# Patient Record
Sex: Female | Born: 1955 | Race: Black or African American | Hispanic: No | Marital: Single | Smoking: Current every day smoker
Health system: Southern US, Community
[De-identification: ages and names within clinical notes are randomized; demographics above are authoritative.]

## PROBLEM LIST (undated history)

## (undated) DIAGNOSIS — F329 Major depressive disorder, single episode, unspecified: Secondary | ICD-10-CM

## (undated) DIAGNOSIS — K219 Gastro-esophageal reflux disease without esophagitis: Secondary | ICD-10-CM

## (undated) HISTORY — DX: Major depressive disorder, single episode, unspecified: F32.9

## (undated) HISTORY — PX: FRACTURE SURGERY: SHX138

## (undated) HISTORY — PX: TUBAL LIGATION: SHX77

## (undated) HISTORY — PX: WRIST FRACTURE SURGERY: SHX121

## (undated) HISTORY — PX: TONSILLECTOMY: SUR1361

## (undated) HISTORY — DX: Gastro-esophageal reflux disease without esophagitis: K21.9

---

## 2004-09-05 DIAGNOSIS — F32A Depression, unspecified: Secondary | ICD-10-CM

## 2004-09-05 DIAGNOSIS — K219 Gastro-esophageal reflux disease without esophagitis: Secondary | ICD-10-CM

## 2004-09-05 HISTORY — DX: Gastro-esophageal reflux disease without esophagitis: K21.9

## 2004-09-05 HISTORY — DX: Depression, unspecified: F32.A

## 2011-04-06 LAB — HM PAP SMEAR: HM Pap smear: NORMAL

## 2014-02-18 ENCOUNTER — Ambulatory Visit (INDEPENDENT_AMBULATORY_CARE_PROVIDER_SITE_OTHER): Payer: BC Managed Care – PPO | Admitting: Adult Health

## 2014-02-18 ENCOUNTER — Encounter: Payer: Self-pay | Admitting: Adult Health

## 2014-02-18 VITALS — BP 113/79 | HR 92 | Temp 98.0°F | Resp 14 | Ht 66.5 in | Wt 202.5 lb

## 2014-02-18 DIAGNOSIS — F32A Depression, unspecified: Secondary | ICD-10-CM

## 2014-02-18 DIAGNOSIS — Z1211 Encounter for screening for malignant neoplasm of colon: Secondary | ICD-10-CM | POA: Insufficient documentation

## 2014-02-18 DIAGNOSIS — Z1239 Encounter for other screening for malignant neoplasm of breast: Secondary | ICD-10-CM

## 2014-02-18 DIAGNOSIS — F3289 Other specified depressive episodes: Secondary | ICD-10-CM

## 2014-02-18 DIAGNOSIS — F329 Major depressive disorder, single episode, unspecified: Secondary | ICD-10-CM

## 2014-02-18 MED ORDER — SERTRALINE HCL 25 MG PO TABS: 25.0000 mg | ORAL_TABLET | Freq: Every day | ORAL | Status: DC

## 2014-02-18 MED ORDER — BUSPIRONE HCL 5 MG PO TABS: 5.0000 mg | ORAL_TABLET | Freq: Two times a day (BID) | ORAL | Status: DC

## 2014-02-18 MED ORDER — BUSPIRONE HCL 5 MG PO TABS: 5.0000 mg | ORAL_TABLET | Freq: Three times a day (TID) | ORAL | Status: DC

## 2014-02-18 NOTE — Patient Instructions (Signed)
  Start zoloft 25 mg daily  Also start Buspar 5 mg twice a day. You can start with once a day and see how you feel then add the second dose.  I would like to see you back in 1 month for follow up.  I am sending you to [facility] for your colonoscopy. They will call you with an appointment.  I am also ordering a mammogram. Hoyle Sauer will help you set that up today before you leave.  You will need to schedule your complete physical exam at your convenience.

## 2014-02-18 NOTE — Progress Notes (Signed)
Pre visit review using our clinic review tool, if applicable. No additional management support is needed unless otherwise documented below in the visit note. 

## 2014-02-18 NOTE — Progress Notes (Signed)
Subjective:    Patient ID: Deborah Jenkins, female    DOB: 04/07/1956, 58 y.o.   MRN: [id number]  HPI  Pleasant 58 yo AA female with a history of depression and GERD presents today to establish care. She was previously being seen at [facility] in [place], [place]. She has her records, but left them at home. She has faced major depression since 2006 since she lost a husband to cancer. Since that time she has lost another husband. She recently moved to [facility] in January to be closer to family. Previously taken Zoloft, with some success and is interested in potentially starting a low dose. Has tried wellbutrin and celexa in the past but did not work. She also started smoking again as a coping mechanism.   Prevention: Eye exam - 12/2013, PAP - 2013, Mammogram -2012, Never had a colonoscopy, Hepatitis B vaccine in progress    Past Medical History  Diagnosis Date  . Depression 2006  . GERD (gastroesophageal reflux disease) 2006     Past Surgical History  Procedure Laterality Date  . Tonsillectomy Bilateral Childhood     Family History  Problem Relation Age of Onset  . Hypertension Mother   . Hypertension Sister   . Alcohol abuse Brother   . Alcohol abuse Brother   . Cirrhosis Brother   . Alcohol abuse Brother   . Cirrhosis Brother      History   Social History  . Marital Status: Single    Spouse Name: N/A    Number of Children: 2  . Years of Education: 14   Occupational History  . Medical Assistant    Social History Main Topics  . Smoking status: Current Every Day Smoker -- 1.00 packs/day for 10 years    Types: Cigarettes  . Smokeless tobacco: Never Used  . Alcohol Use: No  . Drug Use: No  . Sexual Activity: Not on file   Other Topics Concern  . Not on file   Social History Narrative  . No narrative on file     Review of Systems Constitutional: Negative.  HENT: Negative.  Eyes: Negative.  Respiratory: Negative.  Cardiovascular: Negative.    Gastrointestinal: Negative.  Endocrine: Negative.  Genitourinary: Negative.  Musculoskeletal: Negative Skin: Negative. Allergic/Immunologic: Negative.  Neurological: Negative.  Hematological: Negative.  Psychiatric/Behavioral: Negative.     Objective:  BP 113/79  Pulse 92  Temp(Src) 98 F (36.7 C) (Oral)  Resp 14  Ht 5' 6.5" (1.689 m)  Wt 202 lb 8 oz (91.853 kg)  BMI 32.20 kg/m2  SpO2 96%   Physical Exam Constitutional: She is oriented to person, place, and time. No distress.  HENT:  Head: Normocephalic and atraumatic.  Eyes: Conjunctivae and EOM are normal.  Neck: Normal range of motion. Neck supple.  Cardiovascular: Normal rate and regular rhythm.  Pulmonary/Chest: Effort normal. No respiratory distress.  Musculoskeletal: Normal range of motion.  Neurological: She is alert and oriented to person, place, and time. She has normal reflexes. Coordination normal.  Skin: Skin is warm and dry.  Psychiatric: She has a normal mood and affect. Her behavior is normal. Judgment and thought content normal.     Assessment & Plan:   1. Colon cancer screening Refer for screening colonoscopy. She has not had this done. - Ambulatory referral to Gastroenterology  2. Breast cancer screening Needs mammogram. Will have this scheduled. - MM DIGITAL SCREENING BILATERAL; Future  3. Depression Start Zoloft 25 mg daily. Will also add buspar 5 mg  bid. RTC in 1 month for follow up.

## 2014-02-19 ENCOUNTER — Other Ambulatory Visit: Payer: Self-pay | Admitting: *Deleted

## 2014-02-19 DIAGNOSIS — F32A Depression, unspecified: Secondary | ICD-10-CM | POA: Insufficient documentation

## 2014-02-19 DIAGNOSIS — F329 Major depressive disorder, single episode, unspecified: Secondary | ICD-10-CM | POA: Insufficient documentation

## 2014-02-19 MED ORDER — BUSPIRONE HCL 5 MG PO TABS: 5.0000 mg | ORAL_TABLET | Freq: Two times a day (BID) | ORAL | Status: DC

## 2014-03-20 ENCOUNTER — Ambulatory Visit: Payer: BC Managed Care – PPO | Admitting: Adult Health

## 2014-03-20 ENCOUNTER — Ambulatory Visit: Payer: Self-pay | Admitting: Adult Health

## 2014-03-20 LAB — HM MAMMOGRAPHY: HM Mammogram: NEGATIVE

## 2014-05-06 ENCOUNTER — Ambulatory Visit: Payer: BC Managed Care – PPO | Admitting: Adult Health

## 2014-05-16 ENCOUNTER — Ambulatory Visit: Payer: Self-pay | Admitting: Gastroenterology

## 2014-07-22 ENCOUNTER — Encounter: Payer: Self-pay | Admitting: Adult Health

## 2015-06-04 ENCOUNTER — Other Ambulatory Visit: Payer: Self-pay | Admitting: Physician Assistant

## 2015-06-04 DIAGNOSIS — K2100 Gastro-esophageal reflux disease with esophagitis, without bleeding: Secondary | ICD-10-CM

## 2015-06-04 MED ORDER — OMEPRAZOLE 20 MG PO CPDR: 20.0000 mg | DELAYED_RELEASE_CAPSULE | Freq: Every day | ORAL | Status: DC

## 2015-07-21 ENCOUNTER — Other Ambulatory Visit (HOSPITAL_COMMUNITY)
Admission: RE | Admit: 2015-07-21 | Discharge: 2015-07-21 | Disposition: A | Discharge status: Home / Self Care | Payer: 59 | Source: Ambulatory Visit | Attending: Nurse Practitioner | Admitting: Nurse Practitioner

## 2015-07-21 ENCOUNTER — Ambulatory Visit (INDEPENDENT_AMBULATORY_CARE_PROVIDER_SITE_OTHER): Payer: 59 | Admitting: Nurse Practitioner

## 2015-07-21 ENCOUNTER — Encounter: Payer: Self-pay | Admitting: Nurse Practitioner

## 2015-07-21 VITALS — BP 120/84 | HR 65 | Temp 98.5°F | Resp 16 | Ht 67.0 in | Wt 200.6 lb

## 2015-07-21 DIAGNOSIS — Z1151 Encounter for screening for human papillomavirus (HPV): Secondary | ICD-10-CM | POA: Diagnosis not present

## 2015-07-21 DIAGNOSIS — Z0000 Encounter for general adult medical examination without abnormal findings: Secondary | ICD-10-CM

## 2015-07-21 DIAGNOSIS — F32A Depression, unspecified: Secondary | ICD-10-CM

## 2015-07-21 DIAGNOSIS — F329 Major depressive disorder, single episode, unspecified: Secondary | ICD-10-CM

## 2015-07-21 DIAGNOSIS — Z01419 Encounter for gynecological examination (general) (routine) without abnormal findings: Secondary | ICD-10-CM | POA: Diagnosis not present

## 2015-07-21 MED ORDER — ZOLPIDEM TARTRATE 5 MG PO TABS: 5.0000 mg | ORAL_TABLET | Freq: Every evening | ORAL | Status: DC | PRN

## 2015-07-21 NOTE — Patient Instructions (Signed)

## 2015-07-21 NOTE — Progress Notes (Signed)
Pre visit review using our clinic review tool, if applicable. No additional management support is needed unless otherwise documented below in the visit note. 

## 2015-07-21 NOTE — Progress Notes (Signed)
Patient ID: Deborah Jenkins, female    DOB: 25-Jul-1956  Age: 59 y.o. MRN: [id number]  CC: Annual Exam   HPI Deborah Jenkins presents for annual exam.   1) Health Maintenance-   Diet- No formal   Exercise- No formal   Immunizations- UTD  Mammogram- 7/15, wants to do 2 years   Pap- Today   Colonoscopy- 9/15, normal, every 5 years  Eye Exam- Not UTD  Dental Exam- Not UTD   LMP- 59 yo last cycle   2) Acute Problems-  Melatonin- 10 mg, not keeping her asleep, staying asleep 3-4 hours at a time   PHQ-9= 13 (Patient does not like the holidays due to deaths in her family of recent).   Deborah Jenkins has a past medical Deborah of Depression (2006) and GERD (gastroesophageal reflux disease) (2006).   Deborah Jenkins, Childhood).   Her family Deborah includes Alcohol abuse in her brother, brother, and brother; Cirrhosis in her brother and brother; Hypertension in her mother and sister.Deborah reports that Deborah has been smoking Cigarettes.  Deborah has a 10 pack-year smoking Deborah. Deborah has never used smokeless tobacco. Deborah reports that Deborah does not drink alcohol or use illicit drugs.  Outpatient Prescriptions Prior to Visit  Medication Sig Dispense Refill  . aspirin 81 MG tablet Take 81 mg by mouth daily.    . cholecalciferol (VITAMIN D) 1000 UNITS tablet Take 1,000 Units by mouth daily.    . Multiple Vitamins-Minerals (MULTI ADULT GUMMIES) CHEW Chew 1 tablet by mouth daily.    Marland Kitchen omeprazole (PRILOSEC) 20 MG capsule Take 1 capsule (20 mg total) by mouth daily. 90 capsule 4  . busPIRone (BUSPAR) 5 MG tablet Take 1 tablet (5 mg total) by mouth 2 (two) times daily. (Patient not taking: Reported on 07/21/2015) 60 tablet 5  . esomeprazole (NEXIUM) 40 MG capsule Take 40 mg by mouth daily at 12 noon.    . sertraline (ZOLOFT) 25 MG tablet Take 1 tablet (25 mg total) by mouth daily. (Patient not taking: Reported on 07/21/2015) 30 tablet 6   No  facility-administered medications prior to visit.    ROS Review of Systems  Constitutional: Negative for fever, chills, diaphoresis, fatigue and unexpected weight change.  HENT: Negative for tinnitus and trouble swallowing.   Eyes: Negative for visual disturbance.  Respiratory: Negative for chest tightness, shortness of breath and wheezing.   Cardiovascular: Negative for chest pain, palpitations and leg swelling.  Gastrointestinal: Negative for nausea, vomiting, abdominal pain, diarrhea, constipation and blood in stool.  Endocrine: Negative for polydipsia, polyphagia and polyuria.  Genitourinary: Negative for dysuria, hematuria, vaginal discharge and vaginal pain.  Musculoskeletal: Negative for myalgias, back pain, arthralgias and gait problem.  Skin: Negative for color change and rash.  Neurological: Negative for dizziness, weakness, numbness and headaches.  Hematological: Does not bruise/bleed easily.  Psychiatric/Behavioral: Positive for sleep disturbance. Negative for suicidal ideas. The patient is not nervous/anxious.     Objective:  BP 120/84 mmHg  Pulse 65  Temp(Src) 98.5 F (36.9 C)  Resp 16  Ht 5\' 7"  (1.702 m)  Wt 200 lb 9.6 oz (90.992 kg)  BMI 31.41 kg/m2  SpO2 98%  Physical Exam  Constitutional: Deborah is oriented to person, place, and time. Deborah appears well-developed and well-nourished. No distress.  HENT:  Head: Normocephalic and atraumatic.  Right Ear: External ear normal.  Left Ear: External ear normal.  Nose: Nose normal.  Mouth/Throat: Oropharynx is clear and moist. No oropharyngeal  exudate.  TMs and canals clear bilaterally  Eyes: Conjunctivae and EOM are normal. Pupils are equal, round, and reactive to light. Right eye exhibits no discharge. Left eye exhibits no discharge. No scleral icterus.  Neck: Normal range of motion. Neck supple. No thyromegaly present.  Cardiovascular: Normal rate, regular rhythm, normal heart sounds and intact distal pulses.  Exam  reveals no gallop and no friction rub.   No murmur heard. Pulmonary/Chest: Effort normal and breath sounds normal. No respiratory distress. Deborah has no wheezes. Deborah has no rales. Deborah exhibits no tenderness.  Clinical breast exam performed without significant findings  Abdominal: Soft. Bowel sounds are normal. Deborah exhibits no distension and no mass. There is no tenderness. There is no rebound and no guarding.  Genitourinary: Vagina normal and uterus normal. No vaginal discharge found.  Mild vaginal dryness   Musculoskeletal: Normal range of motion. Deborah exhibits no edema or tenderness.  Lymphadenopathy:    Deborah has no cervical adenopathy.  Neurological: Deborah is alert and oriented to person, place, and time. Deborah has normal reflexes. No cranial nerve deficit. Deborah exhibits normal muscle tone. Coordination normal.  Skin: Skin is warm and dry. No rash noted. Deborah is not diaphoretic. No erythema. No pallor.  Psychiatric: Deborah has a normal mood and affect. Her behavior is normal. Judgment and thought content normal.  Tearful when discussed loses of family   Assessment & Plan:   Bitha was seen today for annual exam.  Diagnoses and all orders for this visit:  Routine general medical examination at a health care facility -     CBC with Differential/Platelet; Future -     Comprehensive metabolic panel; Future -     Hemoglobin A1c; Future -     Lipid panel; Future -     TSH; Future -     VITAMIN D 25 Hydroxy (Vit-D Deficiency, Fractures); Future -     Cytology - PAP  Depression  Encounter for routine gynecological examination  Other orders -     zolpidem (AMBIEN) 5 MG tablet; Take 1 tablet (5 mg total) by mouth at bedtime as needed for sleep.   I have discontinued Ms. Hulett's esomeprazole, sertraline, and busPIRone. I am also having her start on zolpidem. Additionally, I am having her maintain her aspirin, MULTI ADULT GUMMIES, cholecalciferol, omeprazole, FLUoxetine, and MELATONIN PO.  Meds  ordered this encounter  Medications  . FLUoxetine (PROZAC) 20 MG capsule    Sig: Take 1 capsule by mouth daily.    Refill:  6  . MELATONIN PO    Sig: Take 1 tablet by mouth daily.  Marland Kitchen zolpidem (AMBIEN) 5 MG tablet    Sig: Take 1 tablet (5 mg total) by mouth at bedtime as needed for sleep.    Dispense:  15 tablet    Refill:  1    Order Specific Question:  Supervising Provider    Answer:  Crecencio Mc [[id number]]     Follow-up: Return in about 1 week (around 07/28/2015) for LAB visit for fasting labs.

## 2015-07-24 LAB — CYTOLOGY - PAP

## 2015-07-29 ENCOUNTER — Other Ambulatory Visit: Payer: 59

## 2015-08-02 DIAGNOSIS — Z01419 Encounter for gynecological examination (general) (routine) without abnormal findings: Secondary | ICD-10-CM | POA: Insufficient documentation

## 2015-08-02 DIAGNOSIS — Z0000 Encounter for general adult medical examination without abnormal findings: Secondary | ICD-10-CM | POA: Insufficient documentation

## 2015-08-02 NOTE — Assessment & Plan Note (Addendum)
Patient is experiencing grief around the holidays. PHQ-9 was positive for probable depressive disorder. We discussed options and she would like to continue on Prozac. I asked if she would like to see a counselor and she entertained the idea. She reported she would let me know. She feels that this is just part of grieving and she is okay at this point in time.  Sleep disturbance part of her depression. Will trial Ambien.

## 2015-08-02 NOTE — Assessment & Plan Note (Signed)
Discussed acute and chronic issues. Reviewed health maintenance measures, PFSHx, and immunizations. Obtain routine labs TSH, Lipid panel, CBC w/ diff, A1c, Vitamin D, and CMET.   PAP obtained today Clinical breast exam done today Pt would like to have Mammograms every 2 years (neg family hx)

## 2015-08-02 NOTE — Assessment & Plan Note (Signed)
PAP obtained today. Clinical breast exam done today without findings. Defer mammogram for 1 more year

## 2015-08-04 ENCOUNTER — Other Ambulatory Visit: Payer: Self-pay | Admitting: Physician Assistant

## 2015-08-21 ENCOUNTER — Ambulatory Visit: Payer: Self-pay | Admitting: Family

## 2015-08-21 ENCOUNTER — Encounter: Payer: Self-pay | Admitting: Physician Assistant

## 2015-08-21 VITALS — BP 135/95 | HR 100 | Temp 97.5°F | Resp 16

## 2015-08-21 DIAGNOSIS — J019 Acute sinusitis, unspecified: Secondary | ICD-10-CM

## 2015-08-21 DIAGNOSIS — J309 Allergic rhinitis, unspecified: Secondary | ICD-10-CM

## 2015-08-21 MED ORDER — AZITHROMYCIN 250 MG PO TABS: ORAL_TABLET | ORAL | Status: DC

## 2015-08-21 MED ORDER — HYDROCOD POLST-CPM POLST ER 10-8 MG/5ML PO SUER: 5.0000 mL | Freq: Two times a day (BID) | ORAL | Status: AC | PRN

## 2015-08-21 NOTE — Progress Notes (Signed)
S/ Chronic allergies , acute sx x 4 - 5 days, facial pain and pressure , ear pain and pressure, fatigue, chilled, no fever  O/ VSS mildly ill , NAD ENT tms dull ,retracted, Nasal turbinates allergic changes with rhinorhea Maxillary and frontal tenderness, pharynx clear neck supple nontender , heart rsr lungs clear A / sinusitis P Zpack 250 mg as directed. neti pot/ saline products. tussionex 1 tsp q12 hours prn #100 cc o rf

## 2015-11-19 DIAGNOSIS — H53002 Unspecified amblyopia, left eye: Secondary | ICD-10-CM | POA: Diagnosis not present

## 2015-11-19 DIAGNOSIS — H53022 Refractive amblyopia, left eye: Secondary | ICD-10-CM | POA: Diagnosis not present

## 2015-11-19 DIAGNOSIS — H52221 Regular astigmatism, right eye: Secondary | ICD-10-CM | POA: Diagnosis not present

## 2015-11-19 DIAGNOSIS — H524 Presbyopia: Secondary | ICD-10-CM | POA: Diagnosis not present

## 2015-11-19 DIAGNOSIS — H5203 Hypermetropia, bilateral: Secondary | ICD-10-CM | POA: Diagnosis not present

## 2015-12-21 ENCOUNTER — Encounter: Payer: Self-pay | Admitting: Physician Assistant

## 2015-12-21 ENCOUNTER — Ambulatory Visit: Payer: Self-pay | Admitting: Physician Assistant

## 2015-12-21 VITALS — BP 118/86 | HR 88 | Temp 97.6°F

## 2015-12-21 MED ORDER — AZITHROMYCIN 250 MG PO TABS: ORAL_TABLET | ORAL | Status: DC

## 2015-12-21 NOTE — Progress Notes (Signed)
S: C/o runny nose and congestion with congested cough for 7 days, no fever, chills, cp/sob, v/d; mucus was green this am, cough is sporadic,   Using otc meds: alka seltzer  O: PE: vitals wnl, nad, perrl eomi, normocephalic, tms dull, nasal mucosa red and swollen, throat injected, neck supple no lymph, lungs c t a, cv rrr, neuro intact  A:  Acute uri   P: zpack, drink fluids, continue regular meds , use otc meds of choice, return if not improving in 5 days, return earlier if worsening

## 2015-12-29 ENCOUNTER — Other Ambulatory Visit: Payer: Self-pay | Admitting: Physician Assistant

## 2015-12-29 NOTE — Telephone Encounter (Signed)
Med refill approved, pt sees her pcp regularly, is doing well on medication and dosage

## 2016-04-15 ENCOUNTER — Ambulatory Visit: Payer: Self-pay | Admitting: Physician Assistant

## 2016-04-15 ENCOUNTER — Encounter: Payer: Self-pay | Admitting: Physician Assistant

## 2016-04-15 VITALS — BP 119/78 | HR 88 | Temp 98.6°F

## 2016-04-15 DIAGNOSIS — M62830 Muscle spasm of back: Secondary | ICD-10-CM | POA: Insufficient documentation

## 2016-04-15 MED ORDER — CYCLOBENZAPRINE HCL 10 MG PO TABS: 10.0000 mg | ORAL_TABLET | Freq: Three times a day (TID) | ORAL | 0 refills | Status: DC | PRN

## 2016-04-15 NOTE — Progress Notes (Signed)
S/ pain to  R  Upper back noted upon awakening , no injury  Or strain known, worse with deep breath, denies CP ,cough ,fever or acute sxs   O/ VSS alert NAD heart rsr , lungs clear,upper thoracic paraspinous muscle with pt tenderness mimicking sxs  A/ Muscle spasm thoracic P topical ice /heat. rx flexeril 10 mg I q 8 h prn #12 o rf.f/u prn not improving.

## 2016-05-18 ENCOUNTER — Ambulatory Visit: Payer: Self-pay | Admitting: Physician Assistant

## 2016-05-18 ENCOUNTER — Encounter: Payer: Self-pay | Admitting: Physician Assistant

## 2016-05-18 DIAGNOSIS — N390 Urinary tract infection, site not specified: Secondary | ICD-10-CM

## 2016-05-18 DIAGNOSIS — R319 Hematuria, unspecified: Principal | ICD-10-CM

## 2016-05-18 LAB — POCT URINALYSIS DIPSTICK
Bilirubin, UA: NEGATIVE
Glucose, UA: NEGATIVE
Ketones, UA: NEGATIVE
Nitrite, UA: NEGATIVE
Protein, UA: NEGATIVE
Spec Grav, UA: 1.02
Urobilinogen, UA: 0.2
pH, UA: 7

## 2016-05-18 MED ORDER — CIPROFLOXACIN HCL 250 MG PO TABS: 250.0000 mg | ORAL_TABLET | Freq: Two times a day (BID) | ORAL | 0 refills | Status: DC

## 2016-05-18 NOTE — Addendum Note (Signed)
Addended by: Versie Starks on: 05/18/2016 12:11 PM   Modules accepted: Orders

## 2016-05-18 NOTE — Progress Notes (Signed)
S: pt not feeling well, chills, body aches, no v/d, no cough and cold sx  O: urine with trace leuks and 1+ blood  A: uti  P cipro 250mg  bid x7d

## 2016-06-20 ENCOUNTER — Other Ambulatory Visit: Payer: Self-pay | Admitting: Physician Assistant

## 2016-06-20 DIAGNOSIS — K2100 Gastro-esophageal reflux disease with esophagitis, without bleeding: Secondary | ICD-10-CM

## 2016-07-26 ENCOUNTER — Other Ambulatory Visit: Payer: Self-pay | Admitting: Physician Assistant

## 2016-08-24 ENCOUNTER — Other Ambulatory Visit: Payer: Self-pay | Admitting: Physician Assistant

## 2016-08-24 MED ORDER — FLUOXETINE HCL 20 MG PO CAPS: 20.0000 mg | ORAL_CAPSULE | Freq: Every day | ORAL | 4 refills | Status: DC

## 2016-08-24 NOTE — Progress Notes (Signed)
Med refill for prozac, 90d supply

## 2016-09-06 ENCOUNTER — Other Ambulatory Visit: Payer: Self-pay | Admitting: Physician Assistant

## 2016-09-06 NOTE — Telephone Encounter (Signed)
Med refill for flonase approved 

## 2016-10-03 ENCOUNTER — Telehealth: Payer: Self-pay | Admitting: Family

## 2016-10-03 MED ORDER — IBUPROFEN 800 MG PO TABS: 800.0000 mg | ORAL_TABLET | Freq: Three times a day (TID) | ORAL | 0 refills | Status: DC | PRN

## 2016-10-03 NOTE — Telephone Encounter (Signed)
Dental pain, dental appt tomorrow, hurts to eat , no fever  .

## 2017-01-16 ENCOUNTER — Ambulatory Visit: Payer: Self-pay | Admitting: Physician Assistant

## 2017-01-16 ENCOUNTER — Encounter: Payer: Self-pay | Admitting: Physician Assistant

## 2017-01-16 VITALS — BP 100/78 | HR 68 | Temp 98.2°F | Resp 99

## 2017-01-16 DIAGNOSIS — J0100 Acute maxillary sinusitis, unspecified: Secondary | ICD-10-CM

## 2017-01-16 MED ORDER — MOMETASONE FUROATE 50 MCG/ACT NA SUSP: 2.0000 | Freq: Every day | NASAL | 12 refills | Status: DC

## 2017-01-16 MED ORDER — AZITHROMYCIN 250 MG PO TABS: ORAL_TABLET | ORAL | 0 refills | Status: DC

## 2017-01-16 MED ORDER — PREDNISONE 10 MG PO TABS: 30.0000 mg | ORAL_TABLET | Freq: Every day | ORAL | 0 refills | Status: DC

## 2017-01-16 NOTE — Progress Notes (Signed)
S: C/o sinus pain and headache , no fever, chills, cp/sob, v/d; cough is sporadic but has a smokers cough, c/o of facial and dental pain.   Using otc meds: flonase  O: PE: vitals wnl, nad, perrl eomi, normocephalic, tms dull, nasal mucosa red and swollen, throat injected, neck supple no lymph, lungs c t a, cv rrr, neuro intact  A:  Acute sinusitis   P: drink fluids, continue regular meds , use otc meds of choice, return if not improving in 5 days, return earlier if worsening , zpack, nasonex, pred 30 mg qd x 3d

## 2017-02-15 ENCOUNTER — Ambulatory Visit: Payer: Self-pay | Admitting: Physician Assistant

## 2017-02-15 ENCOUNTER — Encounter: Payer: Self-pay | Admitting: Physician Assistant

## 2017-02-15 DIAGNOSIS — F411 Generalized anxiety disorder: Secondary | ICD-10-CM

## 2017-02-15 DIAGNOSIS — Z566 Other physical and mental strain related to work: Secondary | ICD-10-CM

## 2017-02-15 MED ORDER — FLUOXETINE HCL 20 MG PO TABS: ORAL_TABLET | ORAL | 3 refills | Status: DC

## 2017-02-15 NOTE — Progress Notes (Signed)
S: pt having a lot of anxiety from pressures at work, states she feels full and like she is just going to break, doesn't like how the supervisors are treating her coworkers, feels that they are all being attacked, it is affecting her mental health, not sleeping well, is getting angry with the ones that are 'stirring trouble", no si/hi  O: nad, neuro intact, pt appears stressed, n/v intact  A: anxiety, work related stress  P: Prozac increase to 2 pills every other day, recommended EAP

## 2017-03-17 ENCOUNTER — Other Ambulatory Visit: Payer: Self-pay | Admitting: Physician Assistant

## 2017-05-16 ENCOUNTER — Ambulatory Visit: Payer: Self-pay | Admitting: Physician Assistant

## 2017-05-16 DIAGNOSIS — F419 Anxiety disorder, unspecified: Secondary | ICD-10-CM

## 2017-05-16 NOTE — Progress Notes (Signed)
S: Pt having a lot of anxiety due to "drama" in workplace, states she did double up on her prozac but it isn't helping  O: nad, neuro intact, pt appears stressed  A: anxiety  P: xanax 0.25mg  #10 nr

## 2017-06-07 ENCOUNTER — Ambulatory Visit: Payer: Self-pay | Admitting: Physician Assistant

## 2017-07-13 ENCOUNTER — Encounter: Payer: Self-pay | Admitting: Physician Assistant

## 2017-07-13 ENCOUNTER — Ambulatory Visit: Payer: Self-pay | Admitting: Physician Assistant

## 2017-07-13 VITALS — Temp 98.3°F

## 2017-07-13 DIAGNOSIS — J209 Acute bronchitis, unspecified: Secondary | ICD-10-CM

## 2017-07-13 MED ORDER — ALBUTEROL SULFATE HFA 108 (90 BASE) MCG/ACT IN AERS: 2.0000 | INHALATION_SPRAY | Freq: Four times a day (QID) | RESPIRATORY_TRACT | 0 refills | Status: DC | PRN

## 2017-07-13 MED ORDER — HYDROCOD POLST-CPM POLST ER 10-8 MG/5ML PO SUER: 5.0000 mL | Freq: Two times a day (BID) | ORAL | 0 refills | Status: DC | PRN

## 2017-07-13 MED ORDER — AZITHROMYCIN 250 MG PO TABS: ORAL_TABLET | ORAL | 0 refills | Status: DC

## 2017-07-13 NOTE — Progress Notes (Signed)
S: C/o cough and congestion with wheezing, chest is sore from coughing, denies fever, chills,o cough is wet and hacking; keeping pt awake at night;  denies cardiac type chest pain or sob, v/d, abd pain Remainder ros neg + smoker  O: vitals wnl, nad, tms clear, throat injected, neck supple no lymph, lungs with scattered wheezing, cv rrr, neuro intact  A:  Acute bronchitis   P:  rx medication:  Zpack, albuterol inhaler, tussionex 161ml nr; smoking cessation, use otc meds, tylenol or motrin as needed for fever/chills, return if not better in 3 -5 days, return earlier if worsening

## 2017-07-24 ENCOUNTER — Ambulatory Visit: Payer: Self-pay | Admitting: Physician Assistant

## 2017-07-24 DIAGNOSIS — K0889 Other specified disorders of teeth and supporting structures: Secondary | ICD-10-CM

## 2017-07-24 MED ORDER — AMOXICILLIN 875 MG PO TABS: 875.0000 mg | ORAL_TABLET | Freq: Two times a day (BID) | ORAL | 0 refills | Status: DC

## 2017-07-24 NOTE — Progress Notes (Signed)
S: Patient complains of a toothache in the left lower jaw, states she has a bad tooth that needs to be taken care of, denies chest pain, fever or chills  O: Appears well, left jaw is a little swollen  A: Dental pain  P: Amoxil 875 twice a day for 10 days

## 2017-08-04 ENCOUNTER — Other Ambulatory Visit: Payer: Self-pay | Admitting: Physician Assistant

## 2017-08-04 DIAGNOSIS — K2100 Gastro-esophageal reflux disease with esophagitis, without bleeding: Secondary | ICD-10-CM

## 2017-08-04 NOTE — Telephone Encounter (Signed)
Please see attached med refill request

## 2017-10-03 ENCOUNTER — Other Ambulatory Visit: Payer: Self-pay | Admitting: Family

## 2017-10-03 MED ORDER — FLUOXETINE HCL 20 MG PO TABS: ORAL_TABLET | ORAL | 3 refills | Status: DC

## 2017-10-03 MED ORDER — FLUTICASONE PROPIONATE 50 MCG/ACT NA SUSP: 2.0000 | Freq: Every day | NASAL | 6 refills | Status: DC

## 2017-10-03 NOTE — Progress Notes (Signed)
Need rx. Refills. Stable on medication.

## 2017-11-07 ENCOUNTER — Ambulatory Visit (INDEPENDENT_AMBULATORY_CARE_PROVIDER_SITE_OTHER): Payer: Self-pay | Admitting: Family Medicine

## 2017-11-07 ENCOUNTER — Encounter: Payer: Self-pay | Admitting: Family Medicine

## 2017-11-07 VITALS — BP 132/88 | HR 80 | Temp 98.1°F | Resp 16

## 2017-11-07 DIAGNOSIS — R059 Cough, unspecified: Secondary | ICD-10-CM

## 2017-11-07 DIAGNOSIS — J0110 Acute frontal sinusitis, unspecified: Secondary | ICD-10-CM

## 2017-11-07 DIAGNOSIS — R05 Cough: Secondary | ICD-10-CM

## 2017-11-07 MED ORDER — AMOXICILLIN-POT CLAVULANATE 875-125 MG PO TABS: 1.0000 | ORAL_TABLET | Freq: Two times a day (BID) | ORAL | 0 refills | Status: DC

## 2017-11-07 NOTE — Progress Notes (Signed)
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 She reports self treating with tessalon pearls, alka seltzer, and tylenol/motrin for symptoms with minimal relief. She has known sick contacts at work and is a smoker.  Review of Systems  Constitutional: Positive for fever and malaise/fatigue.  HENT: Positive for sinus pain and sore throat. Negative for ear discharge, ear pain and nosebleeds.   Eyes: Negative.   Respiratory: Positive for cough. Negative for sputum production and shortness of breath.   Cardiovascular: Negative.   Gastrointestinal: Negative.   Genitourinary: Negative for dysuria and urgency.  Musculoskeletal: Positive for myalgias.  Skin: Negative.   Neurological: Negative.   Psychiatric/Behavioral: Negative.     Physical Exam  Constitutional: She is oriented to person, place, and time. She appears well-developed and well-nourished. She is active.  HENT:  Head: Normocephalic.  Right Ear: Hearing, tympanic membrane, external ear and ear canal normal.  Left Ear: Hearing, tympanic membrane, external ear and ear canal normal.  Nose: Right sinus exhibits frontal sinus tenderness. Left sinus exhibits frontal sinus tenderness.  Marked facial tenderness on exam- patient described as sharp 6/10 on pain scale  Eyes: EOM are normal. Pupils are equal, round, and reactive to light.  Cardiovascular: Normal rate, regular rhythm and normal heart sounds.  No murmur heard. Pulmonary/Chest: Effort normal. No stridor. She has no decreased breath sounds. She has rales in the right upper field and the left upper field.  Abdominal: Soft.  Musculoskeletal: Normal range of motion.  Lymphadenopathy:    She has cervical adenopathy.  Neurological: She is alert and oriented to person, place, and time.    Vitals:   11/07/17 1300  BP: 132/88  Pulse: 80  Resp: 16  Temp: 98.1 F (36.7 C)   SpO2: 98%   A:  1. Acute non-recurrent frontal sinusitis   2. Cough    P: Discussed diagnosis, treatment and medication use and indications. Discussed the timeline of symptoms coupled with smoking status recommendation is to treat with abx and have patient f/u if symptoms unimproved. Patient verbalized understanding on information and agrees with plan of care.  1. Acute non-recurrent frontal sinusitis - amoxicillin-clavulanate (AUGMENTIN) 875-125 MG tablet; Take 1 tablet by mouth 2 (two) times daily.  2. Cough Continue treatment with tessalon pearls, water and soothing options for throat (salt-water rinses, and throat lozenges)

## 2017-11-07 NOTE — Patient Instructions (Signed)
s Sinusitis, Adult Sinusitis is soreness and inflammation of your sinuses. Sinuses are hollow spaces in the bones around your face. Your sinuses are located:  Around your eyes.  In the middle of your forehead.  Behind your nose.  In your cheekbones.  Your sinuses and nasal passages are lined with a stringy fluid (mucus). Mucus normally drains out of your sinuses. When your nasal tissues become inflamed or swollen, the mucus can become trapped or blocked so air cannot flow through your sinuses. This allows bacteria, viruses, and funguses to grow, which leads to infection. Sinusitis can develop quickly and last for 7?10 days (acute) or for more than 12 weeks (chronic). Sinusitis often develops after a cold. What are the causes? This condition is caused by anything that creates swelling in the sinuses or stops mucus from draining, including:  Allergies.  Asthma.  Bacterial or viral infection.  Abnormally shaped bones between the nasal passages.  Nasal growths that contain mucus (nasal polyps).  Narrow sinus openings.  Pollutants, such as chemicals or irritants in the air.  A foreign object stuck in the nose.  A fungal infection. This is rare.  What increases the risk? The following factors may make you more likely to develop this condition:  Having allergies or asthma.  Having had a recent cold or respiratory tract infection.  Having structural deformities or blockages in your nose or sinuses.  Having a weak immune system.  Doing a lot of swimming or diving.  Overusing nasal sprays.  Smoking.  What are the signs or symptoms? The main symptoms of this condition are pain and a feeling of pressure around the affected sinuses. Other symptoms include:  Upper toothache.  Earache.  Headache.  Bad breath.  Decreased sense of smell and taste.  A cough that may get worse at night.  Fatigue.  Fever.  Thick drainage from your nose. The drainage is often green  and it may contain pus (purulent).  Stuffy nose or congestion.  Postnasal drip. This is when extra mucus collects in the throat or back of the nose.  Swelling and warmth over the affected sinuses.  Sore throat.  Sensitivity to light.  How is this diagnosed? This condition is diagnosed based on symptoms, a medical history, and a physical exam. To find out if your condition is acute or chronic, your health care provider may:  Look in your nose for signs of nasal polyps.  Tap over the affected sinus to check for signs of infection.  View the inside of your sinuses using an imaging device that has a light attached (endoscope).  If your health care provider suspects that you have chronic sinusitis, you may also:  Be tested for allergies.  Have a sample of mucus taken from your nose (nasal culture) and checked for bacteria.  Have a mucus sample examined to see if your sinusitis is related to an allergy.  If your sinusitis does not respond to treatment and it lasts longer than 8 weeks, you may have an MRI or CT scan to check your sinuses. These scans also help to determine how severe your infection is. In rare cases, a bone biopsy may be done to rule out more serious types of fungal sinus disease. How is this treated? Treatment for sinusitis depends on the cause and whether your condition is chronic or acute. If a virus is causing your sinusitis, your symptoms will go away on their own within 10 days. You may be given medicines to relieve your  symptoms, including:  Topical nasal decongestants. They shrink swollen nasal passages and let mucus drain from your sinuses.  Antihistamines. These drugs block inflammation that is triggered by allergies. This can help to ease swelling in your nose and sinuses.  Topical nasal corticosteroids. These are nasal sprays that ease inflammation and swelling in your nose and sinuses.  Nasal saline washes. These rinses can help to get rid of thick mucus  in your nose.  If your condition is caused by bacteria, you will be given an antibiotic medicine. If your condition is caused by a fungus, you will be given an antifungal medicine. Surgery may be needed to correct underlying conditions, such as narrow nasal passages. Surgery may also be needed to remove polyps. Follow these instructions at home: Medicines  Take, use, or apply over-the-counter and prescription medicines only as told by your health care provider. These may include nasal sprays.  If you were prescribed an antibiotic medicine, take it as told by your health care provider. Do not stop taking the antibiotic even if you start to feel better. Hydrate and Humidify  Drink enough water to keep your urine clear or pale yellow. Staying hydrated will help to thin your mucus.  Use a cool mist humidifier to keep the humidity level in your home above 50%.  Inhale steam for 10-15 minutes, 3-4 times a day or as told by your health care provider. You can do this in the bathroom while a hot shower is running.  Limit your exposure to cool or dry air. Rest  Rest as much as possible.  Sleep with your head raised (elevated).  Make sure to get enough sleep each night. General instructions  Apply a warm, moist washcloth to your face 3-4 times a day or as told by your health care provider. This will help with discomfort.  Wash your hands often with soap and water to reduce your exposure to viruses and other germs. If soap and water are not available, use hand sanitizer.  Do not smoke. Avoid being around people who are smoking (secondhand smoke).  Keep all follow-up visits as told by your health care provider. This is important. Contact a health care provider if:  You have a fever.  Your symptoms get worse.  Your symptoms do not improve within 10 days. Get help right away if:  You have a severe headache.  You have persistent vomiting.  You have pain or swelling around your face or  eyes.  You have vision problems.  You develop confusion.  Your neck is stiff.  You have trouble breathing. This information is not intended to replace advice given to you by your health care provider. Make sure you discuss any questions you have with your health care provider. Document Released: 08/22/2005 Document Revised: 04/17/2016 Document Reviewed: 06/17/2015 Elsevier Interactive Patient Education  [employer].

## 2017-11-24 ENCOUNTER — Encounter: Payer: Self-pay | Admitting: Family Medicine

## 2017-11-24 ENCOUNTER — Ambulatory Visit (INDEPENDENT_AMBULATORY_CARE_PROVIDER_SITE_OTHER): Payer: Self-pay | Admitting: Family Medicine

## 2017-11-24 VITALS — BP 132/82 | HR 78 | Temp 98.1°F

## 2017-11-24 DIAGNOSIS — Z9114 Patient's other noncompliance with medication regimen: Secondary | ICD-10-CM

## 2017-11-24 DIAGNOSIS — J0110 Acute frontal sinusitis, unspecified: Secondary | ICD-10-CM

## 2017-11-24 DIAGNOSIS — K521 Toxic gastroenteritis and colitis: Secondary | ICD-10-CM

## 2017-11-24 MED ORDER — AZITHROMYCIN 500 MG PO TABS: 500.0000 mg | ORAL_TABLET | Freq: Every day | ORAL | 0 refills | Status: AC

## 2017-11-24 NOTE — Progress Notes (Signed)
Deborah Jenkins is a 62 y.o. female who presents today with persistant cough after treatment for sinusitus within last 2 weeks. Patient reported initially feeling better but then after experiencing diarrhea due to antibiotic did not complete course and now feels like symptoms are returning.  Review of Systems  Constitutional: Positive for malaise/fatigue.  HENT: Positive for congestion.   Eyes: Negative.   Respiratory: Positive for cough.   Cardiovascular: Negative.   Gastrointestinal: Negative.   Genitourinary: Negative.   Musculoskeletal: Negative.   Skin: Negative.   Neurological: Negative.   Endo/Heme/Allergies: Negative.   Psychiatric/Behavioral: Negative.     O: Vitals:   11/24/17 1437  BP: 132/82  Pulse: 78  Temp: 98.1 F (36.7 C)   Patient has a tight frequent cough on exam.  Physical Exam  Constitutional: She is oriented to person, place, and time. Vital signs are normal. She appears well-developed and well-nourished.  HENT:  Head: Normocephalic.  Neck: Normal range of motion. Neck supple.  Cardiovascular: Normal rate and regular rhythm.  Pulmonary/Chest: Effort normal. No stridor. She has no decreased breath sounds. She has no wheezes. She has no rhonchi. She has no rales.  Abdominal: Soft. Bowel sounds are normal.  Musculoskeletal: Normal range of motion.  Lymphadenopathy:    She has cervical adenopathy.       Right cervical: Superficial cervical adenopathy present.       Left cervical: Superficial cervical adenopathy present.  Neurological: She is alert and oriented to person, place, and time.  Skin: Skin is warm and dry.  Psychiatric: She has a normal mood and affect.  Vitals reviewed.   A: 1. Acute non-recurrent frontal sinusitis   2. Non compliance w medication regimen   3. Diarrhea due to drug    P: Discussed importance of continuing treatment until complete and timely communication of distressing side effects to healthcare provider so that alternative  treatment options may be explored. Patient verbalized understanding and agrees with POC at this time. No acute concerns on exam.  1. Acute non-recurrent frontal sinusitis - azithromycin (ZITHROMAX) 500 MG tablet; Take 1 tablet (500 mg total) by mouth daily for 3 days.  2. Non compliance w medication regimen  3. Diarrhea due to drug

## 2017-11-24 NOTE — Patient Instructions (Signed)
Antibiotic Medicine, Adult Antibiotic medicines treat infections caused by a type of germ called bacteria. They work by killing the bacteria that make you sick. When do I need to take antibiotics? You often need these medicines to treat bacterial infections, such as:  A urinary tract infection (UTI).  Strep throat.  Meningitis. This affects the spinal cord and brain.  A bad lung infection.  You may start the medicines while your doctor waits for tests to come back. When the tests come back, your doctor may change or stop your medicine. When are antibiotics not needed? You do not need these medicines for most common illnesses, such as:  A cold.  The flu.  A sore throat.  Antibiotics are not always needed for all infections caused by bacteria. Do not ask for these medicines, or take them, when they are not needed. What are the risks of taking antibiotics? Most antibiotics can cause an infection called Clostridium difficile.This causes watery poop (diarrhea). Let your doctor know right away if:  You have watery poop while taking an antibiotic.  You have watery poop after you stop taking an antibiotic. The illness can happen weeks after you stop the medicine.  You also have a risk of getting an infection in the future that antibiotics cannot treat (antibiotic-resistant infection). This type of infection can be dangerous. What else should I know about taking antibiotics?  You need to take the entire prescription. ? Take the medicine for as long as told by your doctor. ? Do not stop taking it even if you start to feel better.  Try not to miss any doses. If you miss a dose, call your doctor.  Birth control pills may not work. If you take birth control pills: ? Keep on taking them. ? Use a second form of birth control, such as a condom. Do this for as long as told by your doctor.  Ask your doctor: ? How long to wait in between doses. ? If you should take the medicine with  food. ? If there is anything you should stay away from while taking the antibiotic, such as: ? Food. ? Drinks. ? Medicines. ? If there are any side effects you should watch for.  Only take the medicines that your doctor told you to take. Do not take medicines that were given to someone else.  Drink a large glass of water with the medicine.  Ask the pharmacist for a tool to measure the medicine, such as: ? A syringe. ? A cup. ? A spoon.  Throw away any extra medicine. Contact a doctor if:  You get worse.  You have new joint pain or muscle aches after starting the medicine.  You have side effects from the medicine, such as: ? Stomach pain. ? Watery poop. ? Feeling sick to your stomach (nausea). Get help right away if:  You have signs of a very bad allergic reaction. If this happens, stop taking the medicine right away. Signs may include: ? Hives. These are raised, itchy, red bumps on the skin. ? Skin rash. ? Trouble breathing. ? Wheezing. ? Swelling. ? Feeling dizzy. ? Throwing up (vomiting).  Your pee (urine) is dark, or is the color of blood.  Your skin turns yellow.  You bruise easily.  You bleed easily.  You have very bad watery poop and cramps in your belly.  You have a very bad headache. Summary  Antibiotics are often used to treat infections caused by bacteria.  Only take these  medicines when needed.  Let your doctor know if you have watery poop while taking an antibiotic.  You need to take the entire prescription. This information is not intended to replace advice given to you by your health care provider. Make sure you discuss any questions you have with your health care provider. Document Released: 05/31/2008 Document Revised: 08/24/2016 Document Reviewed: 08/24/2016 Elsevier Interactive Patient Education  2017 [employer] Sinusitis, Adult Sinusitis is soreness and inflammation of your sinuses. Sinuses are hollow spaces in the bones around your  face. Your sinuses are located:  Around your eyes.  In the middle of your forehead.  Behind your nose.  In your cheekbones.  Your sinuses and nasal passages are lined with a stringy fluid (mucus). Mucus normally drains out of your sinuses. When your nasal tissues become inflamed or swollen, the mucus can become trapped or blocked so air cannot flow through your sinuses. This allows bacteria, viruses, and funguses to grow, which leads to infection. Sinusitis can develop quickly and last for 7?10 days (acute) or for more than 12 weeks (chronic). Sinusitis often develops after a cold. What are the causes? This condition is caused by anything that creates swelling in the sinuses or stops mucus from draining, including:  Allergies.  Asthma.  Bacterial or viral infection.  Abnormally shaped bones between the nasal passages.  Nasal growths that contain mucus (nasal polyps).  Narrow sinus openings.  Pollutants, such as chemicals or irritants in the air.  A foreign object stuck in the nose.  A fungal infection. This is rare.  What increases the risk? The following factors may make you more likely to develop this condition:  Having allergies or asthma.  Having had a recent cold or respiratory tract infection.  Having structural deformities or blockages in your nose or sinuses.  Having a weak immune system.  Doing a lot of swimming or diving.  Overusing nasal sprays.  Smoking.  What are the signs or symptoms? The main symptoms of this condition are pain and a feeling of pressure around the affected sinuses. Other symptoms include:  Upper toothache.  Earache.  Headache.  Bad breath.  Decreased sense of smell and taste.  A cough that may get worse at night.  Fatigue.  Fever.  Thick drainage from your nose. The drainage is often green and it may contain pus (purulent).  Stuffy nose or congestion.  Postnasal drip. This is when extra mucus collects in the  throat or back of the nose.  Swelling and warmth over the affected sinuses.  Sore throat.  Sensitivity to light.  How is this diagnosed? This condition is diagnosed based on symptoms, a medical history, and a physical exam. To find out if your condition is acute or chronic, your health care provider may:  Look in your nose for signs of nasal polyps.  Tap over the affected sinus to check for signs of infection.  View the inside of your sinuses using an imaging device that has a light attached (endoscope).  If your health care provider suspects that you have chronic sinusitis, you may also:  Be tested for allergies.  Have a sample of mucus taken from your nose (nasal culture) and checked for bacteria.  Have a mucus sample examined to see if your sinusitis is related to an allergy.  If your sinusitis does not respond to treatment and it lasts longer than 8 weeks, you may have an MRI or CT scan to check your sinuses. These scans also help  to determine how severe your infection is. In rare cases, a bone biopsy may be done to rule out more serious types of fungal sinus disease. How is this treated? Treatment for sinusitis depends on the cause and whether your condition is chronic or acute. If a virus is causing your sinusitis, your symptoms will go away on their own within 10 days. You may be given medicines to relieve your symptoms, including:  Topical nasal decongestants. They shrink swollen nasal passages and let mucus drain from your sinuses.  Antihistamines. These drugs block inflammation that is triggered by allergies. This can help to ease swelling in your nose and sinuses.  Topical nasal corticosteroids. These are nasal sprays that ease inflammation and swelling in your nose and sinuses.  Nasal saline washes. These rinses can help to get rid of thick mucus in your nose.  If your condition is caused by bacteria, you will be given an antibiotic medicine. If your condition is  caused by a fungus, you will be given an antifungal medicine. Surgery may be needed to correct underlying conditions, such as narrow nasal passages. Surgery may also be needed to remove polyps. Follow these instructions at home: Medicines  Take, use, or apply over-the-counter and prescription medicines only as told by your health care provider. These may include nasal sprays.  If you were prescribed an antibiotic medicine, take it as told by your health care provider. Do not stop taking the antibiotic even if you start to feel better. Hydrate and Humidify  Drink enough water to keep your urine clear or pale yellow. Staying hydrated will help to thin your mucus.  Use a cool mist humidifier to keep the humidity level in your home above 50%.  Inhale steam for 10-15 minutes, 3-4 times a day or as told by your health care provider. You can do this in the bathroom while a hot shower is running.  Limit your exposure to cool or dry air. Rest  Rest as much as possible.  Sleep with your head raised (elevated).  Make sure to get enough sleep each night. General instructions  Apply a warm, moist washcloth to your face 3-4 times a day or as told by your health care provider. This will help with discomfort.  Wash your hands often with soap and water to reduce your exposure to viruses and other germs. If soap and water are not available, use hand sanitizer.  Do not smoke. Avoid being around people who are smoking (secondhand smoke).  Keep all follow-up visits as told by your health care provider. This is important. Contact a health care provider if:  You have a fever.  Your symptoms get worse.  Your symptoms do not improve within 10 days. Get help right away if:  You have a severe headache.  You have persistent vomiting.  You have pain or swelling around your face or eyes.  You have vision problems.  You develop confusion.  Your neck is stiff.  You have trouble breathing. This  information is not intended to replace advice given to you by your health care provider. Make sure you discuss any questions you have with your health care provider. Document Released: 08/22/2005 Document Revised: 04/17/2016 Document Reviewed: 06/17/2015 Elsevier Interactive Patient Education  [employer].

## 2017-12-13 DIAGNOSIS — H524 Presbyopia: Secondary | ICD-10-CM | POA: Diagnosis not present

## 2017-12-13 DIAGNOSIS — H5203 Hypermetropia, bilateral: Secondary | ICD-10-CM | POA: Diagnosis not present

## 2017-12-13 DIAGNOSIS — H52221 Regular astigmatism, right eye: Secondary | ICD-10-CM | POA: Diagnosis not present

## 2018-01-10 ENCOUNTER — Encounter: Payer: Self-pay | Admitting: Family Medicine

## 2018-01-10 ENCOUNTER — Ambulatory Visit (INDEPENDENT_AMBULATORY_CARE_PROVIDER_SITE_OTHER): Payer: 59 | Admitting: Family Medicine

## 2018-01-10 VITALS — BP 110/88 | HR 79 | Temp 97.9°F | Resp 16 | Ht 67.0 in | Wt 204.0 lb

## 2018-01-10 DIAGNOSIS — Z124 Encounter for screening for malignant neoplasm of cervix: Secondary | ICD-10-CM

## 2018-01-10 DIAGNOSIS — F17200 Nicotine dependence, unspecified, uncomplicated: Secondary | ICD-10-CM

## 2018-01-10 DIAGNOSIS — K219 Gastro-esophageal reflux disease without esophagitis: Secondary | ICD-10-CM | POA: Diagnosis not present

## 2018-01-10 DIAGNOSIS — Z1239 Encounter for other screening for malignant neoplasm of breast: Secondary | ICD-10-CM

## 2018-01-10 DIAGNOSIS — Z0000 Encounter for general adult medical examination without abnormal findings: Secondary | ICD-10-CM

## 2018-01-10 DIAGNOSIS — Z6832 Body mass index (BMI) 32.0-32.9, adult: Secondary | ICD-10-CM | POA: Diagnosis not present

## 2018-01-10 DIAGNOSIS — Z1231 Encounter for screening mammogram for malignant neoplasm of breast: Secondary | ICD-10-CM | POA: Diagnosis not present

## 2018-01-10 DIAGNOSIS — F331 Major depressive disorder, recurrent, moderate: Secondary | ICD-10-CM | POA: Diagnosis not present

## 2018-01-10 DIAGNOSIS — E669 Obesity, unspecified: Secondary | ICD-10-CM

## 2018-01-10 DIAGNOSIS — E66811 Obesity, class 1: Secondary | ICD-10-CM

## 2018-01-10 NOTE — Assessment & Plan Note (Signed)
3 to 5-minute discussion on the health risks of continuing smoking, the benefits of cessation and options for cessation Discussed with patient that most people seem to have vivid dreams, but not nightmares, on Chantix She may consider this in the future We will continue to reassess at each visit whether she is ready to quit yet or not

## 2018-01-10 NOTE — Assessment & Plan Note (Signed)
Well-controlled Continue Prilosec

## 2018-01-10 NOTE — Patient Instructions (Signed)
Preventive Care 40-64 Years, Female Preventive care refers to lifestyle choices and visits with your health care provider that can promote health and wellness. What does preventive care include?  A yearly physical exam. This is also called an annual well check.  Dental exams once or twice a year.  Routine eye exams. Ask your health care provider how often you should have your eyes checked.  Personal lifestyle choices, including: ? Daily care of your teeth and gums. ? Regular physical activity. ? Eating a healthy diet. ? Avoiding tobacco and drug use. ? Limiting alcohol use. ? Practicing safe sex. ? Taking low-dose aspirin daily starting at age 58. ? Taking vitamin and mineral supplements as recommended by your health care provider. What happens during an annual well check? The services and screenings done by your health care provider during your annual well check will depend on your age, overall health, lifestyle risk factors, and family history of disease. Counseling Your health care provider may ask you questions about your:  Alcohol use.  Tobacco use.  Drug use.  Emotional well-being.  Home and relationship well-being.  Sexual activity.  Eating habits.  Work and work Statistician.  Method of birth control.  Menstrual cycle.  Pregnancy history.  Screening You may have the following tests or measurements:  Height, weight, and BMI.  Blood pressure.  Lipid and cholesterol levels. These may be checked every 5 years, or more frequently if you are over 81 years old.  Skin check.  Lung cancer screening. You may have this screening every year starting at age 78 if you have a 30-pack-year history of smoking and currently smoke or have quit within the past 15 years.  Fecal occult blood test (FOBT) of the stool. You may have this test every year starting at age 65.  Flexible sigmoidoscopy or colonoscopy. You may have a sigmoidoscopy every 5 years or a colonoscopy  every 10 years starting at age 30.  Hepatitis C blood test.  Hepatitis B blood test.  Sexually transmitted disease (STD) testing.  Diabetes screening. This is done by checking your blood sugar (glucose) after you have not eaten for a while (fasting). You may have this done every 1-3 years.  Mammogram. This may be done every 1-2 years. Talk to your health care provider about when you should start having regular mammograms. This may depend on whether you have a family history of breast cancer.  BRCA-related cancer screening. This may be done if you have a family history of breast, ovarian, tubal, or peritoneal cancers.  Pelvic exam and Pap test. This may be done every 3 years starting at age 80. Starting at age 36, this may be done every 5 years if you have a Pap test in combination with an HPV test.  Bone density scan. This is done to screen for osteoporosis. You may have this scan if you are at high risk for osteoporosis.  Discuss your test results, treatment options, and if necessary, the need for more tests with your health care provider. Vaccines Your health care provider may recommend certain vaccines, such as:  Influenza vaccine. This is recommended every year.  Tetanus, diphtheria, and acellular pertussis (Tdap, Td) vaccine. You may need a Td booster every 10 years.  Varicella vaccine. You may need this if you have not been vaccinated.  Zoster vaccine. You may need this after age 5.  Measles, mumps, and rubella (MMR) vaccine. You may need at least one dose of MMR if you were born in  1955 or later. You may also need a second dose.  Pneumococcal 13-valent conjugate (PCV13) vaccine. You may need this if you have certain conditions and were not previously vaccinated.  Pneumococcal polysaccharide (PPSV23) vaccine. You may need one or two doses if you smoke cigarettes or if you have certain conditions.  Meningococcal vaccine. You may need this if you have certain  conditions.  Hepatitis A vaccine. You may need this if you have certain conditions or if you travel or work in places where you may be exposed to hepatitis A.  Hepatitis B vaccine. You may need this if you have certain conditions or if you travel or work in places where you may be exposed to hepatitis B.  Haemophilus influenzae type b (Hib) vaccine. You may need this if you have certain conditions.  Talk to your health care provider about which screenings and vaccines you need and how often you need them. This information is not intended to replace advice given to you by your health care provider. Make sure you discuss any questions you have with your health care provider. Document Released: 09/18/2015 Document Revised: 05/11/2016 Document Reviewed: 06/23/2015 Elsevier Interactive Patient Education  2018 [employer]  

## 2018-01-10 NOTE — Assessment & Plan Note (Signed)
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

## 2018-01-10 NOTE — Progress Notes (Signed)
Patient: Deborah Jenkins, Female    DOB: [birth year], 62 y.o.   MRN: [id number] Visit Date: 01/10/2018  Today's Provider: Lavon Paganini, MD   I, Martha Clan, CMA, am acting as scribe for Lavon Paganini, MD.  Chief Complaint  Patient presents with  . Establish Care  . Annual Exam   Subjective:    Annual physical exam Deborah Jenkins is a 62 y.o. female who presents today for health maintenance and complete physical. She feels well. She reports exercising none, outside of yard work. She reports she is sleeping well.  Last pap- 07/21/2015- NIL; HPV negative. Pt was advised to repeat this in one year because the sample "was not the best" (see pt message). Last colonoscopy- 05/16/2014- diverticulosis. Lipoma in ascending colon. Family H/O polyps. Last mammogram- 03/20/2014- BI-RADS 1  Patient's history includes: GERD-well-controlled on omeprazole currently Depression- continues to have moderate symptoms, see PHQ 9.  She was advised previously to increase her Prozac to 40 mg daily, but she does not like the idea of taking medication for this.  She reports that she became depressed after she lost 2 husbands and her grandson to death.  She has not pursued counseling, but will consider this. Tobacco use disorder- patient has been smoking about 1/3 pack daily for 42 years.  She states she is not ready to quit at this time.  She has tried Wellbutrin, cold [place], patches/gum in the past.  She has been hesitant to try Chantix due to the risk of nightmares. -----------------------------------------------------------------   Review of Systems  Constitutional: Negative.   HENT: Negative.   Eyes: Negative.   Respiratory: Negative.   Cardiovascular: Negative.   Gastrointestinal: Negative.   Endocrine: Negative.   Genitourinary: Negative.   Musculoskeletal: Negative.   Skin: Negative.   Allergic/Immunologic: Negative.   Neurological: Negative.   Hematological: Negative.     Psychiatric/Behavioral: Negative.     Social History      She  reports that she has been smoking cigarettes.  She has a 12.60 pack-year smoking history. She has never used smokeless tobacco. She reports that she does not drink alcohol or use drugs.       Social History   Socioeconomic History  . Marital status: Single    Spouse name: Not on file  . Number of children: 2  . Years of education: 26  . Highest education level: Associate degree: academic program  Occupational History  . 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 Financial resource strain: Not on file  . Food insecurity:    Worry: Not on file    Inability: Not on file  . Transportation needs:    Medical: Not on file    Non-medical: Not on file  Tobacco Use  . Smoking status: Current Every Day Smoker    Packs/day: 0.30    Years: 42.00    Pack years: 12.60    Types: Cigarettes  . Smokeless tobacco: Never Used  . Tobacco comment: started smoking at 62 YO; is smoking 1/3 PPD  Substance and Sexual Activity  . Alcohol use: No    Alcohol/week: 0.0 oz  . Drug use: No  . Sexual activity: Yes    Birth control/protection: Post-menopausal  Lifestyle  . Physical activity:    Days per week: Not on file    Minutes per session: Not on file  . Stress: Not on file  Relationships  . Social connections:  Talks on phone: Not on file    Gets together: Not on file    Attends religious service: Not on file    Active member of club or organization: Not on file    Attends meetings of clubs or organizations: Not on file    Relationship status: Not on file  Other Topics Concern  . Not on file  Social History Narrative       Past Medical History:  Diagnosis Date  . Depression 2006  . GERD (gastroesophageal reflux disease) 2006     Patient Active Problem List   Diagnosis Date Noted  . Tobacco use disorder 01/10/2018  . GERD (gastroesophageal reflux disease) 01/10/2018   . Allergic rhinitis 08/21/2015  . Depression 02/19/2014  . Colon cancer screening 02/18/2014  . Breast cancer screening 02/18/2014    Past Surgical History:  Procedure Laterality Date  . TONSILLECTOMY Bilateral Childhood  . TUBAL LIGATION    . WRIST FRACTURE SURGERY Right    in childhood    Family History        Family Status  Relation Name Status  . Mother  Alive  . Father  Alive  . Sister [employer]  . Brother [employer]  . MGM  Deceased  . MGF  Deceased  . PGM  Deceased  . PGF  Deceased  . Sister [employer]  . Brother [employer]  . Brother Marchia Bond Deceased  . Brother [employer]  . Neg Hx  (Not Specified)        Her family history includes Alcohol abuse in her brother and brother; Cirrhosis in her brother and brother; Diverticulosis in her mother; Healthy in her brother, father, sister, and sister; Hypertension in her mother and sister. There is no history of Colon cancer, Breast cancer, Ovarian cancer, or Cervical cancer.      No Known Allergies   Current Outpatient Medications:  .  aspirin 81 MG tablet, Take 81 mg by mouth daily., Disp: , Rfl:  .  BIOTIN PO, Take by mouth., Disp: , Rfl:  .  cholecalciferol (VITAMIN D) 1000 UNITS tablet, Take 1,000 Units by mouth daily., Disp: , Rfl:  .  FLUoxetine (PROZAC) 20 MG tablet, Take one or 2 pills per day, Disp: 60 tablet, Rfl: 3 .  fluticasone (FLONASE) 50 MCG/ACT nasal spray, Place 2 sprays into both nostrils daily., Disp: 16 g, Rfl: 6 .  MELATONIN PO, Take 1 tablet by mouth daily. Reported on 08/21/2015, Disp: , Rfl:  .  Omega-3 Fatty Acids (FISH OIL) 1000 MG CAPS, Take by mouth., Disp: , Rfl:  .  omeprazole (PRILOSEC) 20 MG capsule, TAKE 1 CAPSULE BY MOUTH DAILY., Disp: 90 capsule, Rfl: 4 .  triamcinolone cream (KENALOG) 0.1 %, APPLY TO AFFECTED AREA(S) TWICE DAILY, Disp: 15 g, Rfl: 5 .  albuterol (PROVENTIL HFA;VENTOLIN HFA) 108 (90 Base) MCG/ACT inhaler, Inhale 2 puffs every 6 (six) hours as needed into the  lungs for wheezing or shortness of breath. (Patient not taking: Reported on 01/10/2018), Disp: 1 Inhaler, Rfl: 0   Patient Care Team: Virginia Crews, MD as PCP - General (Family Medicine)      Objective:   Vitals: BP 110/88 (BP Location: Left Arm, Patient Position: Sitting, Cuff Size: Large)   Pulse 79   Temp 97.9 F (36.6 C) (Oral)   Resp 16   Ht 5\' 7"  (1.702 m)   Wt 204 lb (92.5 kg)   SpO2 97%   BMI 31.95 kg/m    Vitals:  01/10/18 1017  BP: 110/88  Pulse: 79  Resp: 16  Temp: 97.9 F (36.6 C)  TempSrc: Oral  SpO2: 97%  Weight: 204 lb (92.5 kg)  Height: 5\' 7"  (1.702 m)     Physical Exam  Constitutional: She is oriented to person, place, and time. She appears well-developed and well-nourished. No distress.  HENT:  Head: Normocephalic and atraumatic.  Right Ear: External ear normal.  Left Ear: External ear normal.  Nose: Nose normal.  Mouth/Throat: Oropharynx is clear and moist.  Eyes: Pupils are equal, round, and reactive to light. Conjunctivae and EOM are normal. No scleral icterus.  Neck: Neck supple. No thyromegaly present.  Cardiovascular: Normal rate, regular rhythm, normal heart sounds and intact distal pulses.  No murmur heard. Pulmonary/Chest: Effort normal and breath sounds normal. No respiratory distress. She has no wheezes. She has no rales.  Abdominal: Soft. Bowel sounds are normal. She exhibits no distension. There is no tenderness. There is no rebound and no guarding.  Genitourinary:  Genitourinary Comments: Breasts: breasts appear normal, no suspicious masses, no skin or nipple changes or axillary nodes.  GYN:  External genitalia within normal limits.  Vaginal mucosa pink, moist, normal rugae.  Nonfriable cervix without lesions, no discharge or bleeding noted on speculum exam.   Musculoskeletal: She exhibits no edema or deformity.  Lymphadenopathy:    She has no cervical adenopathy.  Neurological: She is alert and oriented to person, place, and  time.  Skin: Skin is warm and dry. No rash noted.  Psychiatric: She has a normal mood and affect. Her behavior is normal.  Vitals reviewed.    Depression Screen PHQ 2/9 Scores 01/10/2018 07/21/2015  PHQ - 2 Score 4 5  PHQ- 9 Score 11 13      Assessment & Plan:     Routine Health Maintenance and Physical Exam  Exercise Activities and Dietary recommendations Goals    None       There is no immunization history on file for this patient.  Health Maintenance  Topic Date Due  . MAMMOGRAM  03/20/2016  . INFLUENZA VACCINE  04/05/2018  . PAP SMEAR  07/20/2018  . COLONOSCOPY  05/17/2019  . TETANUS/TDAP  07/20/2022  . Hepatitis C Screening  Completed  . HIV Screening  Completed     Discussed health benefits of physical activity, and encouraged her to engage in regular exercise appropriate for her age and condition.    --------------------------------------------------------------------  Problem List Items Addressed This Visit      Digestive   GERD (gastroesophageal reflux disease)    Well-controlled Continue Prilosec        Other   Breast cancer screening   Relevant Orders   MS DIGITAL SCREENING TOMO BILATERAL   Depression    Depression with some qualities of adjustment disorder and grief She can continue her current dose of Prozac as she is hesitant to take any more medications Discussed the value of seeing a counselor We will follow-up in 3 months on her depression to discuss further      Tobacco use disorder    3 to 5-minute discussion on the health risks of continuing smoking, the benefits of cessation and options for cessation Discussed with patient that most people seem to have vivid dreams, but not nightmares, on Chantix She may consider this in the future We will continue to reassess at each visit whether she is ready to quit yet or not       Other Visit Diagnoses  Encounter for annual physical exam    -  Primary   Relevant Orders   Lipid panel    Comprehensive metabolic panel   CBC   Class 1 obesity without serious comorbidity with body mass index (BMI) of 32.0 to 32.9 in adult, unspecified obesity type       Relevant Orders   Lipid panel   Comprehensive metabolic panel   CBC   Cervical cancer screening       Relevant Orders   Pap IG and HPV (high risk) DNA detection       Return in about 3 months (around 04/12/2018) for depression f/u.   The entirety of the information documented in the History of Present Illness, Review of Systems and Physical Exam were personally obtained by me. Portions of this information were initially documented by Raquel Sarna Ratchford, CMA and reviewed by me for thoroughness and accuracy.    Virginia Crews, MD, MPH [facility] 01/10/2018 11:30 AM

## 2018-01-11 ENCOUNTER — Telehealth: Payer: Self-pay

## 2018-01-11 LAB — LIPID PANEL
Chol/HDL Ratio: 3.6 ratio (ref 0.0–4.4)
Cholesterol, Total: 274 mg/dL — ABNORMAL HIGH (ref 100–199)
HDL: 77 mg/dL (ref >39)
LDL Calculated: 178 mg/dL — ABNORMAL HIGH (ref 0–99)
Triglycerides: 93 mg/dL (ref 0–149)
VLDL Cholesterol Cal: 19 mg/dL (ref 5–40)

## 2018-01-11 LAB — CBC
Hematocrit: 38.7 % (ref 34.0–46.6)
Hemoglobin: 12.8 g/dL (ref 11.1–15.9)
MCH: 28 pg (ref 26.6–33.0)
MCHC: 33.1 g/dL (ref 31.5–35.7)
MCV: 85 fL (ref 79–97)
Platelets: 348 10*3/uL (ref 150–379)
RBC: 4.57 x10E6/uL (ref 3.77–5.28)
RDW: 14.4 % (ref 12.3–15.4)
WBC: 5.5 10*3/uL (ref 3.4–10.8)

## 2018-01-11 LAB — COMPREHENSIVE METABOLIC PANEL
ALT: 23 IU/L (ref 0–32)
AST: 24 IU/L (ref 0–40)
Albumin/Globulin Ratio: 1.8 (ref 1.2–2.2)
Albumin: 4.4 g/dL (ref 3.5–5.5)
Alkaline Phosphatase: 81 IU/L (ref 39–117)
BUN/Creatinine Ratio: 14 (ref 9–23)
BUN: 12 mg/dL (ref 6–24)
Bilirubin Total: 0.3 mg/dL (ref 0.0–1.2)
CO2: 24 mmol/L (ref 20–29)
Calcium: 9.4 mg/dL (ref 8.7–10.2)
Chloride: 102 mmol/L (ref 96–106)
Creatinine, Ser: 0.84 mg/dL (ref 0.57–1.00)
GFR calc Af Amer: 88 mL/min/{1.73_m2} (ref >59)
GFR calc non Af Amer: 76 mL/min/{1.73_m2} (ref >59)
Globulin, Total: 2.4 g/dL (ref 1.5–4.5)
Glucose: 98 mg/dL (ref 65–99)
Potassium: 4.7 mmol/L (ref 3.5–5.2)
Sodium: 140 mmol/L (ref 134–144)
Total Protein: 6.8 g/dL (ref 6.0–8.5)

## 2018-01-11 NOTE — Telephone Encounter (Signed)
Left message to call back  

## 2018-01-11 NOTE — Telephone Encounter (Signed)
-----   Message from Virginia Crews, MD sent at 01/11/2018  2:58 PM EDT ----- Normal kidney function, liver function, electrolytes, Blood counts. Cholesterol is high.  10 year risk of heart disease/stroke is borderline at 6.3%.  A medication for lowering cholesterol is definitely needed if risk is >7.5%.  We could consider it at this stage.  Also reasonable to try diet low in saturated fats and exercise, as well as quitting smoking for next 3-6 months and then recheck.  Will leave decision up to patient  Virginia Crews, MD, MPH [facility] 01/11/2018 2:57 PM

## 2018-01-12 ENCOUNTER — Encounter: Payer: Self-pay | Admitting: Family Medicine

## 2018-01-12 ENCOUNTER — Other Ambulatory Visit: Payer: Self-pay | Admitting: Family Medicine

## 2018-01-12 ENCOUNTER — Telehealth: Payer: Self-pay

## 2018-01-12 LAB — PAP IG AND HPV HIGH-RISK
HPV, high-risk: NEGATIVE
PAP Smear Comment: 0

## 2018-01-12 MED ORDER — ATORVASTATIN CALCIUM 10 MG PO TABS: 10.0000 mg | ORAL_TABLET | Freq: Every day | ORAL | 5 refills | Status: DC

## 2018-01-12 NOTE — Telephone Encounter (Signed)
-----   Message from Virginia Crews, MD sent at 01/12/2018  1:38 PM EDT ----- Normal pap smear. HPV negative.  Repeat in 5 years.  Virginia Crews, MD, MPH [facility] 01/12/2018 1:37 PM

## 2018-01-12 NOTE — Telephone Encounter (Signed)
[id number] 

## 2018-01-12 NOTE — Telephone Encounter (Signed)
Patient has been advised. KW 

## 2018-01-18 NOTE — Telephone Encounter (Signed)
Viewed by Vassie Loll on 01/11/2018 5:20 PM

## 2018-02-02 ENCOUNTER — Ambulatory Visit
Admission: RE | Admit: 2018-02-02 | Discharge: 2018-02-02 | Disposition: A | Discharge status: Home / Self Care | Payer: 59 | Source: Ambulatory Visit | Attending: Family Medicine | Admitting: Family Medicine

## 2018-02-02 DIAGNOSIS — Z1239 Encounter for other screening for malignant neoplasm of breast: Secondary | ICD-10-CM

## 2018-02-02 DIAGNOSIS — Z1231 Encounter for screening mammogram for malignant neoplasm of breast: Secondary | ICD-10-CM | POA: Diagnosis not present

## 2018-02-06 ENCOUNTER — Other Ambulatory Visit: Payer: Self-pay | Admitting: Family Medicine

## 2018-02-06 MED ORDER — NAPROXEN 500 MG PO TABS: 500.0000 mg | ORAL_TABLET | Freq: Two times a day (BID) | ORAL | 1 refills | Status: DC | PRN

## 2018-04-06 ENCOUNTER — Ambulatory Visit: Payer: 59 | Admitting: Family Medicine

## 2018-04-06 ENCOUNTER — Ambulatory Visit: Payer: Self-pay | Admitting: Family Medicine

## 2018-04-06 ENCOUNTER — Encounter: Payer: Self-pay | Admitting: Family Medicine

## 2018-04-06 VITALS — BP 132/92 | HR 79 | Temp 98.2°F | Resp 16 | Wt 203.0 lb

## 2018-04-06 DIAGNOSIS — F331 Major depressive disorder, recurrent, moderate: Secondary | ICD-10-CM | POA: Diagnosis not present

## 2018-04-06 DIAGNOSIS — F17200 Nicotine dependence, unspecified, uncomplicated: Secondary | ICD-10-CM

## 2018-04-06 DIAGNOSIS — F419 Anxiety disorder, unspecified: Secondary | ICD-10-CM | POA: Insufficient documentation

## 2018-04-06 DIAGNOSIS — E7800 Pure hypercholesterolemia, unspecified: Secondary | ICD-10-CM | POA: Diagnosis not present

## 2018-04-06 DIAGNOSIS — E785 Hyperlipidemia, unspecified: Secondary | ICD-10-CM | POA: Insufficient documentation

## 2018-04-06 MED ORDER — FLUTICASONE PROPIONATE 50 MCG/ACT NA SUSP: 2.0000 | Freq: Every day | NASAL | 11 refills | Status: DC

## 2018-04-06 MED ORDER — FLUOXETINE HCL 20 MG PO TABS: 40.0000 mg | ORAL_TABLET | Freq: Every day | ORAL | 3 refills | Status: DC

## 2018-04-06 MED ORDER — TRIAMCINOLONE ACETONIDE 0.1 % EX CREA: TOPICAL_CREAM | Freq: Two times a day (BID) | CUTANEOUS | 11 refills | Status: DC

## 2018-04-06 NOTE — Progress Notes (Signed)
Patient: Deborah Jenkins Female    DOB: Jan 04, 1956   62 y.o.   MRN: [id number] Visit Date: 04/06/2018  Today's Provider: Lavon Paganini, MD   I, Martha Clan, CMA, am acting as scribe for Lavon Paganini, MD.   Chief Complaint  Patient presents with  . Depression  . Hyperlipidemia   Subjective:    Depression         Chronicity: Follow up from 01/10/2018. She was advisied to continue Prozac 20 mg.The problem is unchanged.  Associated symptoms include decreased concentration, helplessness, hopelessness, decreased interest and sad.  Associated symptoms include no fatigue, does not have insomnia (improved with melatonin), not irritable, no restlessness, no appetite change, no body aches, no myalgias, no headaches, no indigestion and no suicidal ideas.  Past treatments include SSRIs - Selective serotonin reuptake inhibitors.  Compliance with treatment is good.  Previous treatment provided mild relief.   Follow up for Hyperlipidemia  The patient was last seen for this 3 months ago. Changes made at last visit include starting atorvastatin 10 mg..  She stopped taking this medication because it caused hair loss. She states she is working on diet and exercise to improve her lipids.  ------------------------------------------------------------------------------------  Pt is currently still smoking about 1/3 PPD. She has signed up for classes through her insurance to start help with smoking cessation.      Allergies  Allergen Reactions  . Atorvastatin     Hair loss     Current Outpatient Medications:  .  aspirin 81 MG tablet, Take 81 mg by mouth daily., Disp: , Rfl:  .  BIOTIN PO, Take by mouth., Disp: , Rfl:  .  cholecalciferol (VITAMIN D) 1000 UNITS tablet, Take 1,000 Units by mouth daily., Disp: , Rfl:  .  FLUoxetine (PROZAC) 20 MG tablet, Take 2 tablets (40 mg total) by mouth daily., Disp: 180 tablet, Rfl: 3 .  fluticasone (FLONASE) 50 MCG/ACT nasal spray, Place 2  sprays into both nostrils daily., Disp: 16 g, Rfl: 11 .  MELATONIN PO, Take 1 tablet by mouth daily. Reported on 08/21/2015, Disp: , Rfl:  .  Omega-3 Fatty Acids (FISH OIL) 1000 MG CAPS, Take by mouth., Disp: , Rfl:  .  omeprazole (PRILOSEC) 20 MG capsule, TAKE 1 CAPSULE BY MOUTH DAILY., Disp: 90 capsule, Rfl: 4 .  triamcinolone cream (KENALOG) 0.1 %, Apply topically 2 (two) times daily., Disp: 60 g, Rfl: 11 .  albuterol (PROVENTIL HFA;VENTOLIN HFA) 108 (90 Base) MCG/ACT inhaler, Inhale 2 puffs every 6 (six) hours as needed into the lungs for wheezing or shortness of breath. (Patient not taking: Reported on 01/10/2018), Disp: 1 Inhaler, Rfl: 0 .  naproxen (NAPROSYN) 500 MG tablet, Take 1 tablet (500 mg total) by mouth 2 (two) times daily as needed for up to 60 doses for moderate pain. (Patient not taking: Reported on 04/06/2018), Disp: 60 tablet, Rfl: 1  Review of Systems  Constitutional: Negative for appetite change and fatigue.  Musculoskeletal: Negative for myalgias.  Neurological: Negative for headaches.  Psychiatric/Behavioral: Positive for decreased concentration and depression. Negative for suicidal ideas. The patient does not have insomnia (improved with melatonin).     Social History   Tobacco Use  . Smoking status: Current Every Day Smoker    Packs/day: 0.30    Years: 42.00    Pack years: 12.60    Types: Cigarettes  . Smokeless tobacco: Never Used  . Tobacco comment: started smoking at 62 YO; is smoking 1/3 PPD  Substance  Use Topics  . Alcohol use: No    Alcohol/week: 0.0 oz   Objective:   BP (!) 132/92 (BP Location: Left Arm, Patient Position: Sitting, Cuff Size: Normal)   Pulse 79   Temp 98.2 F (36.8 C) (Oral)   Resp 16   Wt 203 lb (92.1 kg)   SpO2 99%   BMI 31.79 kg/m  Vitals:   04/06/18 1107  BP: (!) 132/92  Pulse: 79  Resp: 16  Temp: 98.2 F (36.8 C)  TempSrc: Oral  SpO2: 99%  Weight: 203 lb (92.1 kg)     Physical Exam  Constitutional: She is oriented  to person, place, and time. She appears well-developed and well-nourished. She is not irritable. No distress.  HENT:  Head: Normocephalic and atraumatic.  Mouth/Throat: Oropharynx is clear and moist.  Eyes: Conjunctivae are normal.  Neck: Neck supple. No thyromegaly present.  Cardiovascular: Normal rate, regular rhythm, normal heart sounds and intact distal pulses.  No murmur heard. Pulmonary/Chest: Effort normal and breath sounds normal. No respiratory distress. She has no wheezes. She has no rales.  Musculoskeletal: She exhibits no edema.  Lymphadenopathy:    She has no cervical adenopathy.  Neurological: She is alert and oriented to person, place, and time.  Skin: Skin is warm and dry. Capillary refill takes less than 2 seconds. No rash noted.  Psychiatric: She has a normal mood and affect. Her behavior is normal.  Vitals reviewed.   Depression screen [facility] 2/9 04/06/2018 01/10/2018 07/21/2015  Decreased Interest 3 2 3   Down, Depressed, Hopeless 1 2 2   PHQ - 2 Score 4 4 5   Altered sleeping 0 2 3  Tired, decreased energy 0 0 3  Change in appetite 0 0 0  Feeling bad or failure about yourself  1 3 1   Trouble concentrating 1 2 1   Moving slowly or fidgety/restless 0 0 0  Suicidal thoughts 0 0 0  PHQ-9 Score 6 11 13   Difficult doing work/chores Not difficult at all Not difficult at all Somewhat difficult    GAD 7 : Generalized Anxiety Score 04/06/2018  Nervous, Anxious, on Edge 0  Control/stop worrying 3  Worry too much - different things 3  Trouble relaxing 2  Restless 0  Easily annoyed or irritable 0  Afraid - awful might happen 3  Total GAD 7 Score 11  Anxiety Difficulty Not difficult at all       Assessment & Plan:   Problem List Items Addressed This Visit      Other   Depression - Primary    Chronic, stable Fairly well controlled Have previously discussed the value of therapy For anxiety, will increase prozac to 40mg  daily      Relevant Medications   FLUoxetine  (PROZAC) 20 MG tablet   Tobacco use disorder    Planning to work on cessation Encouraged efforts      Anxiety    Chronic, stable With slight improvement since last visit Agree to increase prozac to 40mg  daily Will f/u at next visit or sooner if needed      Relevant Medications   FLUoxetine (PROZAC) 20 MG tablet   Hyperlipidemia    Alopecia from atorvastatin Does not want to try another statin at this time Will continue diet and exercise and recheck at CPE Could consider a different statin in the future          Return in about 9 months (around 01/05/2019) for CPE (after 5/8).   The entirety of the information  documented in the History of Present Illness, Review of Systems and Physical Exam were personally obtained by me. Portions of this information were initially documented by Raquel Sarna Ratchford, CMA and reviewed by me for thoroughness and accuracy.    Virginia Crews, MD, MPH [facility] 04/06/2018 2:56 PM

## 2018-04-06 NOTE — Assessment & Plan Note (Signed)
Alopecia from atorvastatin Does not want to try another statin at this time Will continue diet and exercise and recheck at CPE Could consider a different statin in the future

## 2018-04-06 NOTE — Assessment & Plan Note (Signed)
Chronic, stable Fairly well controlled Have previously discussed the value of therapy For anxiety, will increase prozac to 40mg  daily

## 2018-04-06 NOTE — Patient Instructions (Signed)

## 2018-04-06 NOTE — Assessment & Plan Note (Signed)
Chronic, stable With slight improvement since last visit Agree to increase prozac to 40mg  daily Will f/u at next visit or sooner if needed

## 2018-04-06 NOTE — Assessment & Plan Note (Signed)
Planning to work on cessation Encouraged efforts

## 2018-05-15 ENCOUNTER — Other Ambulatory Visit: Payer: Self-pay | Admitting: Family Medicine

## 2018-05-15 MED ORDER — BENZONATATE 100 MG PO CAPS: 100.0000 mg | ORAL_CAPSULE | Freq: Three times a day (TID) | ORAL | 0 refills | Status: DC | PRN

## 2018-05-15 MED ORDER — AZITHROMYCIN 250 MG PO TABS: ORAL_TABLET | ORAL | 0 refills | Status: DC

## 2018-05-15 NOTE — Progress Notes (Signed)
Prescribed antibiotic and tessalon pearls for sinusitis

## 2018-10-12 ENCOUNTER — Telehealth: Payer: 59 | Admitting: Family

## 2018-10-12 DIAGNOSIS — J208 Acute bronchitis due to other specified organisms: Secondary | ICD-10-CM

## 2018-10-12 DIAGNOSIS — J111 Influenza due to unidentified influenza virus with other respiratory manifestations: Secondary | ICD-10-CM

## 2018-10-12 MED ORDER — OSELTAMIVIR PHOSPHATE 75 MG PO CAPS
75.0000 mg | ORAL_CAPSULE | Freq: Two times a day (BID) | ORAL | 0 refills | Status: DC
Start: 2018-10-12 — End: 2019-03-22

## 2018-10-12 MED ORDER — BENZONATATE 100 MG PO CAPS: 100.0000 mg | ORAL_CAPSULE | Freq: Three times a day (TID) | ORAL | 0 refills | Status: DC | PRN

## 2018-10-12 NOTE — Progress Notes (Signed)
Greater than 5 minutes, yet less than 10 minutes of time have been spent researching, coordinating, and implementing care for this patient today.  Thank you for the details you included in the comment boxes. Those details are very helpful in determining the best course of treatment for you and help [place] to provide the best care.  We are sorry that you are not feeling well.  Here is how we plan to help!  Based on your presentation I believe you most likely have A cough due to a virus.  This is called viral bronchitis and is best treated by rest, plenty of fluids and control of the cough.  You may use Ibuprofen or Tylenol as directed to help your symptoms.     In addition you may use A non-prescription cough medication called Mucinex DM: take 2 tablets every 12 hours. and A prescription cough medication called Tessalon Perles 100mg . You may take 1-2 capsules every 8 hours as needed for your cough.   From your responses in the eVisit questionnaire you describe inflammation in the upper respiratory tract which is causing a significant cough.  This is commonly called Bronchitis and has four common causes:    Allergies  Viral Infections  Acid Reflux  Bacterial Infection Allergies, viruses and acid reflux are treated by controlling symptoms or eliminating the cause. An example might be a cough caused by taking certain blood pressure medications. You stop the cough by changing the medication. Another example might be a cough caused by acid reflux. Controlling the reflux helps control the cough.  USE OF BRONCHODILATOR ("RESCUE") INHALERS: There is a risk from using your bronchodilator too frequently.  The risk is that over-reliance on a medication which only relaxes the muscles surrounding the breathing tubes can reduce the effectiveness of medications prescribed to reduce swelling and congestion of the tubes themselves.  Although you feel brief relief from the bronchodilator inhaler, your asthma may  actually be worsening with the tubes becoming more swollen and filled with mucus.  This can delay other crucial treatments, such as oral steroid medications. If you need to use a bronchodilator inhaler daily, several times per day, you should discuss this with your provider.  There are probably better treatments that could be used to keep your asthma under control.     HOME CARE . Only take medications as instructed by your medical team. . Complete the entire course of an antibiotic. . Drink plenty of fluids and get plenty of rest. . Avoid close contacts especially the very young and the elderly . Cover your mouth if you cough or cough into your sleeve. . Always remember to wash your hands . A steam or ultrasonic humidifier can help congestion.   GET HELP RIGHT AWAY IF: . You develop worsening fever. . You become short of breath . You cough up blood. . Your symptoms persist after you have completed your treatment plan MAKE SURE YOU   Understand these instructions.  Will watch your condition.  Will get help right away if you are not doing well or get worse.  Your e-visit answers were reviewed by a board certified advanced clinical practitioner to complete your personal care plan.  Depending on the condition, your plan could have included both over the counter or prescription medications. If there is a problem please reply  once you have received a response from your provider. Your safety is important to [place].  If you have drug allergies check your prescription carefully.  You can use MyChart to ask questions about today's visit, request a non-urgent call back, or ask for a work or school excuse for 24 hours related to this e-Visit. If it has been greater than 24 hours you will need to follow up with your provider, or enter a new e-Visit to address those concerns. You will get an e-mail in the next two days asking about your experience.  I hope that your e-visit has been valuable and will  speed your recovery. Thank you for using e-visits.

## 2018-10-12 NOTE — Progress Notes (Signed)
ADDENDUM:  E visit for Flu like symptoms   We are sorry that you are not feeling well.  Here is how we plan to help! Based on what you have shared with me it looks like you may have possible exposure to a virus that causes influenza.  Influenza or "the flu" is   an infection caused by a respiratory virus. The flu virus is highly contagious and persons who did not receive their yearly flu vaccination may "catch" the flu from close contact.  We have anti-viral medications to treat the viruses that cause this infection. They are not a "cure" and only shorten the course of the infection. These prescriptions are most effective when they are given within the first 2 days of "flu" symptoms. Antiviral medication are indicated if you have a high risk of complications from the flu. You should  also consider an antiviral medication if you are in close contact with someone who is at risk. These medications can help patients avoid complications from the flu  but have side effects that you should know. Possible side effects from Tamiflu or oseltamivir include nausea, vomiting, diarrhea, dizziness, headaches, eye redness, sleep problems or other respiratory symptoms. You should not take Tamiflu if you have an allergy to oseltamivir or any to the ingredients in Tamiflu.  Based upon your symptoms and potential risk factors I have prescribed Oseltamivir (Tamiflu).  It has been sent to your designated pharmacy.  You will take one 75 mg capsule orally twice a day for the next 5 days.  ANYONE WHO HAS FLU SYMPTOMS SHOULD: . Stay home. The flu is highly contagious and going out or to work exposes others! . Be sure to drink plenty of fluids. Water is fine as well as fruit juices, sodas and electrolyte beverages. You may want to stay away from caffeine or alcohol. If you are nauseated, try taking small sips of liquids. How do you know if you are getting enough fluid? Your urine should be a pale yellow or almost  colorless. . Get rest. . Taking a steamy shower or using a humidifier may help nasal congestion and ease sore throat pain. Using a saline nasal spray works much the same way. . Cough drops, hard candies and sore throat lozenges may ease your cough. . Line up a caregiver. Have someone check on you regularly.   GET HELP RIGHT AWAY IF: . You cannot keep down liquids or your medications. . You become short of breath . Your fell like you are going to pass out or loose consciousness. . Your symptoms persist after you have completed your treatment plan MAKE SURE YOU   Understand these instructions.  Will watch your condition.  Will get help right away if you are not doing well or get worse.  Your e-visit answers were reviewed by a board certified advanced clinical practitioner to complete your personal care plan.  Depending on the condition, your plan could have included both over the counter or prescription medications.  If there is a problem please reply  once you have received a response from your provider.  Your safety is important to [place].  If you have drug allergies check your prescription carefully.    You can use MyChart to ask questions about today's visit, request a non-urgent call back, or ask for a work or school excuse for 24 hours related to this e-Visit. If it has been greater than 24 hours you will need to follow up with your provider, or enter  a new e-Visit to address those concerns.  You will get an e-mail in the next two days asking about your experience.  I hope that your e-visit has been valuable and will speed your recovery. Thank you for using e-visits.

## 2018-10-12 NOTE — Addendum Note (Signed)
Addended by: Benjamine Mola on: 10/12/2018 02:20 PM   Modules accepted: Orders

## 2018-10-18 ENCOUNTER — Other Ambulatory Visit: Payer: Self-pay | Admitting: Family Medicine

## 2018-10-18 DIAGNOSIS — K2100 Gastro-esophageal reflux disease with esophagitis, without bleeding: Secondary | ICD-10-CM

## 2018-10-18 MED ORDER — OMEPRAZOLE 20 MG PO CPDR: 20.0000 mg | DELAYED_RELEASE_CAPSULE | Freq: Every day | ORAL | 4 refills | Status: DC

## 2018-10-18 NOTE — Telephone Encounter (Signed)
Real faxed refill request for the following medications:  omeprazole (PRILOSEC) 20 MG capsule  90 day supply  Last Rx: 08/04/17 by Letitia Neri LOV: 04/06/2018 Please advise. Thanks TNP

## 2018-12-26 ENCOUNTER — Encounter: Payer: Self-pay | Admitting: Family Medicine

## 2018-12-28 ENCOUNTER — Ambulatory Visit: Payer: Self-pay

## 2018-12-28 ENCOUNTER — Other Ambulatory Visit: Payer: Self-pay

## 2019-01-09 MED FILL — FLUTICASONE PROP 50 MCG SPR: 50 | 30 days supply | Qty: 16 | Fill #0

## 2019-01-09 MED FILL — OMEPRAZOLE 20 MG CPDR: 20 | 90 days supply | Qty: 90 | Fill #0

## 2019-01-15 ENCOUNTER — Ambulatory Visit: Payer: Self-pay | Admitting: Family Medicine

## 2019-01-22 ENCOUNTER — Telehealth: Payer: Self-pay

## 2019-01-22 NOTE — Telephone Encounter (Signed)
[facility] for PHQ9 screening. 

## 2019-01-30 NOTE — Telephone Encounter (Signed)
Will complete at upcoming appt 03/22/2019.

## 2019-02-20 DIAGNOSIS — I959 Hypotension, unspecified: Secondary | ICD-10-CM | POA: Diagnosis not present

## 2019-03-22 ENCOUNTER — Ambulatory Visit (INDEPENDENT_AMBULATORY_CARE_PROVIDER_SITE_OTHER): Payer: 59 | Admitting: Family Medicine

## 2019-03-22 ENCOUNTER — Other Ambulatory Visit: Payer: Self-pay

## 2019-03-22 ENCOUNTER — Encounter: Payer: Self-pay | Admitting: Family Medicine

## 2019-03-22 VITALS — BP 127/85 | HR 85 | Temp 98.2°F | Resp 15 | Ht 67.0 in | Wt 210.9 lb

## 2019-03-22 DIAGNOSIS — E7800 Pure hypercholesterolemia, unspecified: Secondary | ICD-10-CM

## 2019-03-22 DIAGNOSIS — L409 Psoriasis, unspecified: Secondary | ICD-10-CM | POA: Insufficient documentation

## 2019-03-22 DIAGNOSIS — Z1211 Encounter for screening for malignant neoplasm of colon: Secondary | ICD-10-CM | POA: Diagnosis not present

## 2019-03-22 DIAGNOSIS — J301 Allergic rhinitis due to pollen: Secondary | ICD-10-CM | POA: Diagnosis not present

## 2019-03-22 DIAGNOSIS — Z1239 Encounter for other screening for malignant neoplasm of breast: Secondary | ICD-10-CM

## 2019-03-22 DIAGNOSIS — F331 Major depressive disorder, recurrent, moderate: Secondary | ICD-10-CM

## 2019-03-22 DIAGNOSIS — Z0000 Encounter for general adult medical examination without abnormal findings: Secondary | ICD-10-CM | POA: Diagnosis not present

## 2019-03-22 DIAGNOSIS — F419 Anxiety disorder, unspecified: Secondary | ICD-10-CM

## 2019-03-22 DIAGNOSIS — K219 Gastro-esophageal reflux disease without esophagitis: Secondary | ICD-10-CM | POA: Diagnosis not present

## 2019-03-22 DIAGNOSIS — Z8371 Family history of colonic polyps: Secondary | ICD-10-CM | POA: Diagnosis not present

## 2019-03-22 DIAGNOSIS — F17200 Nicotine dependence, unspecified, uncomplicated: Secondary | ICD-10-CM

## 2019-03-22 DIAGNOSIS — E669 Obesity, unspecified: Secondary | ICD-10-CM

## 2019-03-22 DIAGNOSIS — Z6833 Body mass index (BMI) 33.0-33.9, adult: Secondary | ICD-10-CM

## 2019-03-22 MED ORDER — TRIAMCINOLONE ACETONIDE 0.5 % EX OINT: 1.0000 "application " | TOPICAL_OINTMENT | Freq: Two times a day (BID) | CUTANEOUS | 11 refills | Status: DC

## 2019-03-22 MED ORDER — FLUTICASONE PROPIONATE 50 MCG/ACT NA SUSP: 2.0000 | Freq: Every day | NASAL | 11 refills | Status: DC

## 2019-03-22 NOTE — Patient Instructions (Signed)

## 2019-03-22 NOTE — Assessment & Plan Note (Signed)
Chronic and stable Slight increase in symptoms related to current pandemic Continue Prozac 40 mg daily

## 2019-03-22 NOTE — Assessment & Plan Note (Signed)
Discussed importance of healthy weight management Discussed diet and exercise  

## 2019-03-22 NOTE — Assessment & Plan Note (Signed)
Patient developed alopecia from atorvastatin and does not want to try another statin at this time Discussed diet and exercise Recheck CMP and FLP

## 2019-03-22 NOTE — Assessment & Plan Note (Signed)
Present on left elbow Somewhat well controlled by triamcinolone cream, we will switch to triamcinolone ointment Discussed importance of regular application until well controlled No joint symptoms

## 2019-03-22 NOTE — Assessment & Plan Note (Signed)
Discussed the benefits of cessation and the risks of continued smoking Patient remains pre-contemplative at this time

## 2019-03-22 NOTE — Assessment & Plan Note (Signed)
Chronic, well controlled Continue Flonase as needed

## 2019-03-22 NOTE — Progress Notes (Signed)
Patient: Deborah Jenkins, Female    DOB: 1956/05/06, 63 y.o.   MRN: [id number] Visit Date: 03/22/2019  Today's Provider: Lavon Paganini, MD   Chief Complaint  Patient presents with  . Annual Exam   Subjective:   Deborah Jenkins,acting as a Education administrator for Lavon Paganini, MD.,have documented all relevant documentation on the behalf of Lavon Paganini, MD,as directed by  Lavon Paganini, MD while in the presence of Lavon Paganini, MD.    Annual physical exam Deborah Jenkins is a 63 y.o. female who presents today for health maintenance and complete physical. She feels well. She reports she is not exercising . She reports she is sleeping well.  -------------------------------------------------------------  Patient reports dryness of her left elbow present for 3 to 4 years, but worse in the last few months.  She has been using triamcinolone cream which helps, but does not relieve it.  It has been somewhat painful if it scrapes on surfaces  Patient continues to smoke and is not ready to quit at this time.  She states that she join the quit Smart classes through her employer, but after the pandemic she found her stress levels were too high to think about quitting smoking  Review of Systems  Constitutional: Negative.   HENT: Positive for sinus pressure.   Eyes: Negative.   Respiratory: Negative.   Cardiovascular: Negative.   Gastrointestinal: Negative.   Musculoskeletal: Negative.   Skin: Positive for rash.  Allergic/Immunologic: Negative.   Neurological: Negative.   Hematological: Negative.   Psychiatric/Behavioral: Negative.     Social History      She  reports that she has been smoking cigarettes. She has a 12.60 pack-year smoking history. She has never used smokeless tobacco. She reports that she does not drink alcohol or use drugs.       Social History   Socioeconomic History  . Marital status: Single    Spouse name: Not on file  . Number of children: 2  .  Years of education: 45  . Highest education level: Associate degree: academic program  Occupational History  . Occupation: Ship broker: [employer]  Social Needs  . Financial resource strain: Not on file  . Food insecurity    Worry: Not on file    Inability: Not on file  . Transportation needs    Medical: Not on file    Non-medical: Not on file  Tobacco Use  . Smoking status: Current Every Day Smoker    Packs/day: 0.30    Years: 42.00    Pack years: 12.60    Types: Cigarettes  . Smokeless tobacco: Never Used  . Tobacco comment: started smoking at 63 YO; is smoking 1/3 PPD  Substance and Sexual Activity  . Alcohol use: No    Alcohol/week: 0.0 standard drinks  . Drug use: No  . Sexual activity: Yes    Birth control/protection: Post-menopausal  Lifestyle  . Physical activity    Days per week: Not on file    Minutes per session: Not on file  . Stress: Not on file  Relationships  . Social Herbalist on phone: Not on file    Gets together: Not on file    Attends religious service: Not on file    Active member of club or organization: Not on file    Attends meetings of clubs or organizations: Not on file    Relationship status: Not on file  Other Topics Concern  . Not on file  Social History Narrative       Past Medical History:  Diagnosis Date  . Depression 2006  . GERD (gastroesophageal reflux disease) 2006     Patient Active Problem List   Diagnosis Date Noted  . Class 1 obesity without serious comorbidity with body mass index (BMI) of 33.0 to 33.9 in adult 03/22/2019  . Psoriasis 03/22/2019  . Anxiety 04/06/2018  . Hyperlipidemia 04/06/2018  . Tobacco use disorder 01/10/2018  . GERD (gastroesophageal reflux disease) 01/10/2018  . Allergic rhinitis 08/21/2015  . Depression 02/19/2014    Past Surgical History:  Procedure Laterality Date  . TONSILLECTOMY Bilateral Childhood  . TUBAL LIGATION    . WRIST  FRACTURE SURGERY Right    in childhood    Family History        Family Status  Relation Name Status  . Mother  Alive  . Father  Alive  . Sister [employer]  . Brother [employer]  . MGM  Deceased  . MGF  Deceased  . PGM  Deceased  . PGF  Deceased  . Sister [employer]  . Brother [employer]  . Brother Marchia Bond Deceased  . Brother [employer]  . Neg Hx  (Not Specified)        Her family history includes Alcohol abuse in her brother and brother; Cirrhosis in her brother and brother; Diverticulosis in her mother; Healthy in her brother, father, sister, and sister; Hypertension in her mother and sister. There is no history of Colon cancer, Breast cancer, Ovarian cancer, or Cervical cancer.      Allergies  Allergen Reactions  . Atorvastatin     Hair loss     Current Outpatient Medications:  .  aspirin 81 MG tablet, Take 81 mg by mouth daily., Disp: , Rfl:  .  BIOTIN PO, Take by mouth., Disp: , Rfl:  .  cholecalciferol (VITAMIN D) 1000 UNITS tablet, Take 1,000 Units by mouth daily., Disp: , Rfl:  .  FLUoxetine (PROZAC) 20 MG tablet, Take 2 tablets (40 mg total) by mouth daily., Disp: 180 tablet, Rfl: 3 .  fluticasone (FLONASE) 50 MCG/ACT nasal spray, Place 2 sprays into both nostrils daily., Disp: 16 g, Rfl: 11 .  MELATONIN PO, Take 1 tablet by mouth daily. Reported on 08/21/2015, Disp: , Rfl:  .  Omega-3 Fatty Acids (FISH OIL) 1000 MG CAPS, Take by mouth., Disp: , Rfl:  .  omeprazole (PRILOSEC) 20 MG capsule, Take 1 capsule (20 mg total) by mouth daily., Disp: 90 capsule, Rfl: 4 .  albuterol (PROVENTIL HFA;VENTOLIN HFA) 108 (90 Base) MCG/ACT inhaler, Inhale 2 puffs every 6 (six) hours as needed into the lungs for wheezing or shortness of breath. (Patient not taking: Reported on 01/10/2018), Disp: 1 Inhaler, Rfl: 0 .  naproxen (NAPROSYN) 500 MG tablet, Take 1 tablet (500 mg total) by mouth 2 (two) times daily as needed for up to 60 doses for moderate pain. (Patient not taking:  Reported on 04/06/2018), Disp: 60 tablet, Rfl: 1 .  triamcinolone ointment (KENALOG) 0.5 %, Apply 1 application topically 2 (two) times daily., Disp: 30 g, Rfl: 11   Patient Care Team: Virginia Crews, MD as PCP - General (Family Medicine)    Objective:    Vitals: BP 127/85   Pulse 85   Temp 98.2 F (36.8 C) (Oral)   Resp 15   Ht 5\' 7"  (1.702 m)   Wt 210 lb 14.4 oz (95.7  kg)   SpO2 98%   BMI 33.03 kg/m    Vitals:   03/22/19 1335  BP: 127/85  Pulse: 85  Resp: 15  Temp: 98.2 F (36.8 C)  TempSrc: Oral  SpO2: 98%  Weight: 210 lb 14.4 oz (95.7 kg)  Height: 5\' 7"  (1.702 m)     Physical Exam Vitals signs reviewed.  Constitutional:      General: She is not in acute distress.    Appearance: Normal appearance. She is well-developed. She is not diaphoretic.  HENT:     Head: Normocephalic and atraumatic.     Right Ear: Tympanic membrane, ear canal and external ear normal.     Left Ear: Tympanic membrane, ear canal and external ear normal.  Eyes:     General: No scleral icterus.    Extraocular Movements: Extraocular movements intact.     Conjunctiva/sclera: Conjunctivae normal.     Pupils: Pupils are equal, round, and reactive to light.  Neck:     Musculoskeletal: Neck supple.     Thyroid: No thyromegaly.  Cardiovascular:     Rate and Rhythm: Normal rate and regular rhythm.     Pulses: Normal pulses.     Heart sounds: Normal heart sounds. No murmur.  Pulmonary:     Effort: Pulmonary effort is normal. No respiratory distress.     Breath sounds: Normal breath sounds. No wheezing or rales.  Abdominal:     General: There is no distension.     Palpations: Abdomen is soft.     Tenderness: There is no abdominal tenderness.  Genitourinary:    Comments: Breasts: breasts appear normal, no suspicious masses, no skin or nipple changes or axillary nodes.  Musculoskeletal:        General: No deformity.     Right lower leg: No edema.     Left lower leg: No edema.   Lymphadenopathy:     Cervical: No cervical adenopathy.  Skin:    General: Skin is warm and dry.     Capillary Refill: Capillary refill takes less than 2 seconds.     Comments: Small plaque psoriasis on left elbow with some hyperpigmentation  Neurological:     Mental Status: She is alert and oriented to person, place, and time. Mental status is at baseline.  Psychiatric:        Mood and Affect: Mood normal.        Behavior: Behavior normal.        Thought Content: Thought content normal.      Depression Screen PHQ 2/9 Scores 03/22/2019 04/06/2018 01/10/2018 07/21/2015  PHQ - 2 Score 3 4 4 5   PHQ- 9 Score 7 6 11 13    GAD 7 : Generalized Anxiety Score 03/22/2019 04/06/2018  Nervous, Anxious, on Edge 2 0  Control/stop worrying 2 3  Worry too much - different things 3 3  Trouble relaxing 2 2  Restless 2 0  Easily annoyed or irritable 0 0  Afraid - awful might happen 0 3  Total GAD 7 Score 11 11  Anxiety Difficulty Not difficult at all Not difficult at all      Assessment & Plan:     Routine Health Maintenance and Physical Exam  Exercise Activities and Dietary recommendations Goals   None      There is no immunization history on file for this patient.  Health Maintenance  Topic Date Due  . INFLUENZA VACCINE  04/06/2019  . COLONOSCOPY  05/17/2019  . MAMMOGRAM  02/03/2020  .  TETANUS/TDAP  07/20/2022  . PAP SMEAR-Modifier  01/11/2023  . Hepatitis C Screening  Completed  . HIV Screening  Completed     Discussed health benefits of physical activity, and encouraged her to engage in regular exercise appropriate for her age and condition.    --------------------------------------------------------------------   Problem List Items Addressed This Visit      Respiratory   Allergic rhinitis    Chronic, well controlled Continue Flonase as needed        Digestive   GERD (gastroesophageal reflux disease)    Chronic, well controlled Continue Prilosec      Relevant  Orders   CBC with Differential/Platelet     Musculoskeletal and Integument   Psoriasis    Present on left elbow Somewhat well controlled by triamcinolone cream, we will switch to triamcinolone ointment Discussed importance of regular application until well controlled No joint symptoms        Other   Depression    Chronic and stable Fairly well controlled Continue Prozac 40 mg daily Have discussed the value of therapy Contracted for safety-no SI      Tobacco use disorder    Discussed the benefits of cessation and the risks of continued smoking Patient remains pre-contemplative at this time      Anxiety    Chronic and stable Slight increase in symptoms related to current pandemic Continue Prozac 40 mg daily      Hyperlipidemia    Patient developed alopecia from atorvastatin and does not want to try another statin at this time Discussed diet and exercise Recheck CMP and FLP      Relevant Orders   Lipid panel   Comprehensive metabolic panel   Class 1 obesity without serious comorbidity with body mass index (BMI) of 33.0 to 33.9 in adult    Discussed importance of healthy weight management Discussed diet and exercise        Other Visit Diagnoses    Encounter for annual physical exam    -  Primary   Relevant Orders   Lipid panel   Comprehensive metabolic panel   CBC with Differential/Platelet   Colon cancer screening       Relevant Orders   Ambulatory referral to Gastroenterology   Family history of colonic polyps       Relevant Orders   Ambulatory referral to Gastroenterology   Screening for breast cancer       Relevant Orders   MM 3D SCREEN BREAST BILATERAL       Return in about 1 year (around 03/21/2020) for CPE.   The entirety of the information documented in the History of Present Illness, Review of Systems and Physical Exam were personally obtained by me. Portions of this information were initially documented by Jennings Books, CMA and reviewed by  me for thoroughness and accuracy.    , Dionne Bucy, MD MPH [facility]

## 2019-03-22 NOTE — Assessment & Plan Note (Signed)
Chronic, well controlled Continue Prilosec

## 2019-03-22 NOTE — Assessment & Plan Note (Signed)
Chronic and stable Fairly well controlled Continue Prozac 40 mg daily Have discussed the value of therapy Contracted for safety-no SI

## 2019-03-23 LAB — COMPREHENSIVE METABOLIC PANEL
ALT: 22 IU/L (ref 0–32)
AST: 20 IU/L (ref 0–40)
Albumin/Globulin Ratio: 1.9 (ref 1.2–2.2)
Albumin: 4.6 g/dL (ref 3.8–4.9)
Alkaline Phosphatase: 81 IU/L (ref 39–117)
BUN/Creatinine Ratio: 10 — ABNORMAL LOW (ref 12–28)
BUN: 9 mg/dL (ref 8–27)
Bilirubin Total: 0.3 mg/dL (ref 0.0–1.2)
CO2: 20 mmol/L (ref 20–29)
Calcium: 9.6 mg/dL (ref 8.7–10.3)
Chloride: 103 mmol/L (ref 96–106)
Creatinine, Ser: 0.89 mg/dL (ref 0.57–1.00)
GFR calc Af Amer: 81 mL/min/{1.73_m2} (ref >59)
GFR calc non Af Amer: 71 mL/min/{1.73_m2} (ref >59)
Globulin, Total: 2.4 g/dL (ref 1.5–4.5)
Glucose: 89 mg/dL (ref 65–99)
Potassium: 4.7 mmol/L (ref 3.5–5.2)
Sodium: 140 mmol/L (ref 134–144)
Total Protein: 7 g/dL (ref 6.0–8.5)

## 2019-03-23 LAB — CBC WITH DIFFERENTIAL/PLATELET
Basophils Absolute: 0 10*3/uL (ref 0.0–0.2)
Basos: 1 %
EOS (ABSOLUTE): 0.1 10*3/uL (ref 0.0–0.4)
Eos: 2 %
Hematocrit: 41.5 % (ref 34.0–46.6)
Hemoglobin: 12.9 g/dL (ref 11.1–15.9)
Immature Grans (Abs): 0 10*3/uL (ref 0.0–0.1)
Immature Granulocytes: 0 %
Lymphocytes Absolute: 2.5 10*3/uL (ref 0.7–3.1)
Lymphs: 40 %
MCH: 27 pg (ref 26.6–33.0)
MCHC: 31.1 g/dL — ABNORMAL LOW (ref 31.5–35.7)
MCV: 87 fL (ref 79–97)
Monocytes Absolute: 0.4 10*3/uL (ref 0.1–0.9)
Monocytes: 7 %
Neutrophils Absolute: 3.1 10*3/uL (ref 1.4–7.0)
Neutrophils: 50 %
Platelets: 383 10*3/uL (ref 150–450)
RBC: 4.78 x10E6/uL (ref 3.77–5.28)
RDW: 13.5 % (ref 11.7–15.4)
WBC: 6.2 10*3/uL (ref 3.4–10.8)

## 2019-03-23 LAB — LIPID PANEL
Chol/HDL Ratio: 3.5 ratio (ref 0.0–4.4)
Cholesterol, Total: 273 mg/dL — ABNORMAL HIGH (ref 100–199)
HDL: 77 mg/dL (ref >39)
LDL Calculated: 182 mg/dL — ABNORMAL HIGH (ref 0–99)
Triglycerides: 72 mg/dL (ref 0–149)
VLDL Cholesterol Cal: 14 mg/dL (ref 5–40)

## 2019-03-27 ENCOUNTER — Telehealth: Payer: Self-pay

## 2019-03-27 NOTE — Telephone Encounter (Signed)
[facility] 

## 2019-03-27 NOTE — Telephone Encounter (Signed)
-----   Message from Virginia Crews, MD sent at 03/26/2019  9:54 AM EDT ----- Normal labs, other than cholesterol has worsened.  I know atorvastatin caused issues. I'd recommend trying a different statin like Crestor.

## 2019-03-29 MED ORDER — ROSUVASTATIN CALCIUM 5 MG PO TABS: 5.0000 mg | ORAL_TABLET | Freq: Every day | ORAL | 3 refills | Status: DC

## 2019-03-29 NOTE — Telephone Encounter (Signed)
Patient advised and said she would try it.

## 2019-03-29 NOTE — Telephone Encounter (Signed)
Rx sernt for low dose Crestor (5mg  daily).  F/u in 3 months to repeat labs and check in on medication

## 2019-04-03 ENCOUNTER — Encounter: Payer: Self-pay | Admitting: *Deleted

## 2019-04-08 ENCOUNTER — Telehealth: Payer: 59 | Admitting: Physician Assistant

## 2019-04-08 ENCOUNTER — Encounter: Payer: Self-pay | Admitting: Physician Assistant

## 2019-04-08 DIAGNOSIS — M549 Dorsalgia, unspecified: Secondary | ICD-10-CM | POA: Diagnosis not present

## 2019-04-08 MED ORDER — CYCLOBENZAPRINE HCL 10 MG PO TABS: 10.0000 mg | ORAL_TABLET | Freq: Three times a day (TID) | ORAL | 0 refills | Status: DC | PRN

## 2019-04-08 MED ORDER — NAPROXEN 500 MG PO TABS: 500.0000 mg | ORAL_TABLET | Freq: Two times a day (BID) | ORAL | 0 refills | Status: DC

## 2019-04-08 NOTE — Progress Notes (Signed)

## 2019-04-16 ENCOUNTER — Other Ambulatory Visit: Payer: Self-pay | Admitting: Family Medicine

## 2019-05-09 ENCOUNTER — Ambulatory Visit
Admission: RE | Admit: 2019-05-09 | Discharge: 2019-05-09 | Disposition: A | Discharge status: Home / Self Care | Payer: 59 | Source: Ambulatory Visit | Attending: Family Medicine | Admitting: Family Medicine

## 2019-05-09 DIAGNOSIS — Z1231 Encounter for screening mammogram for malignant neoplasm of breast: Secondary | ICD-10-CM | POA: Insufficient documentation

## 2019-05-09 DIAGNOSIS — Z1239 Encounter for other screening for malignant neoplasm of breast: Secondary | ICD-10-CM

## 2020-01-13 ENCOUNTER — Other Ambulatory Visit: Payer: Self-pay | Admitting: Family Medicine

## 2020-01-13 DIAGNOSIS — K2100 Gastro-esophageal reflux disease with esophagitis, without bleeding: Secondary | ICD-10-CM

## 2020-03-22 IMAGING — MG MM DIGITAL SCREENING BILAT W/ TOMO W/ CAD
6 of 10 series · 6 of 30 positions shown · non-contrast
Comparison: Previous exam(s).

CLINICAL DATA: Screening.

EXAM:
DIGITAL SCREENING BILATERAL MAMMOGRAM WITH TOMO AND CAD

[L MLO synth-2D (1 of 2)]
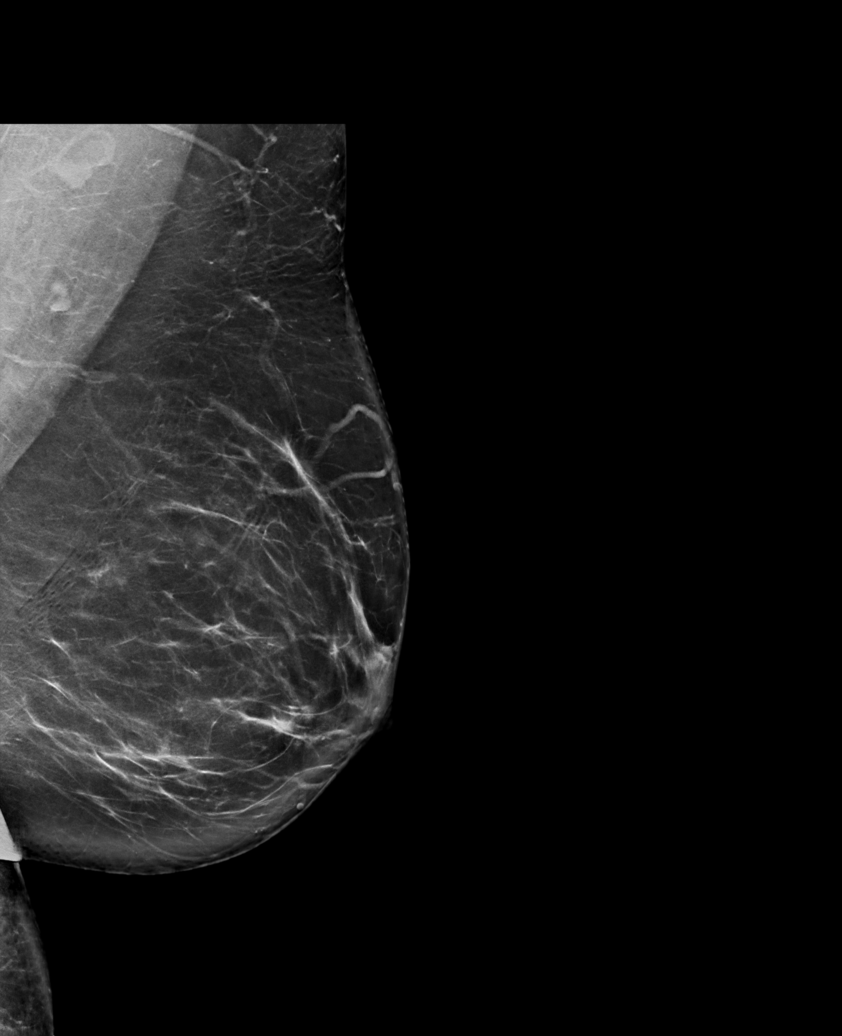

[L MLO synth-2D (2 of 2)]
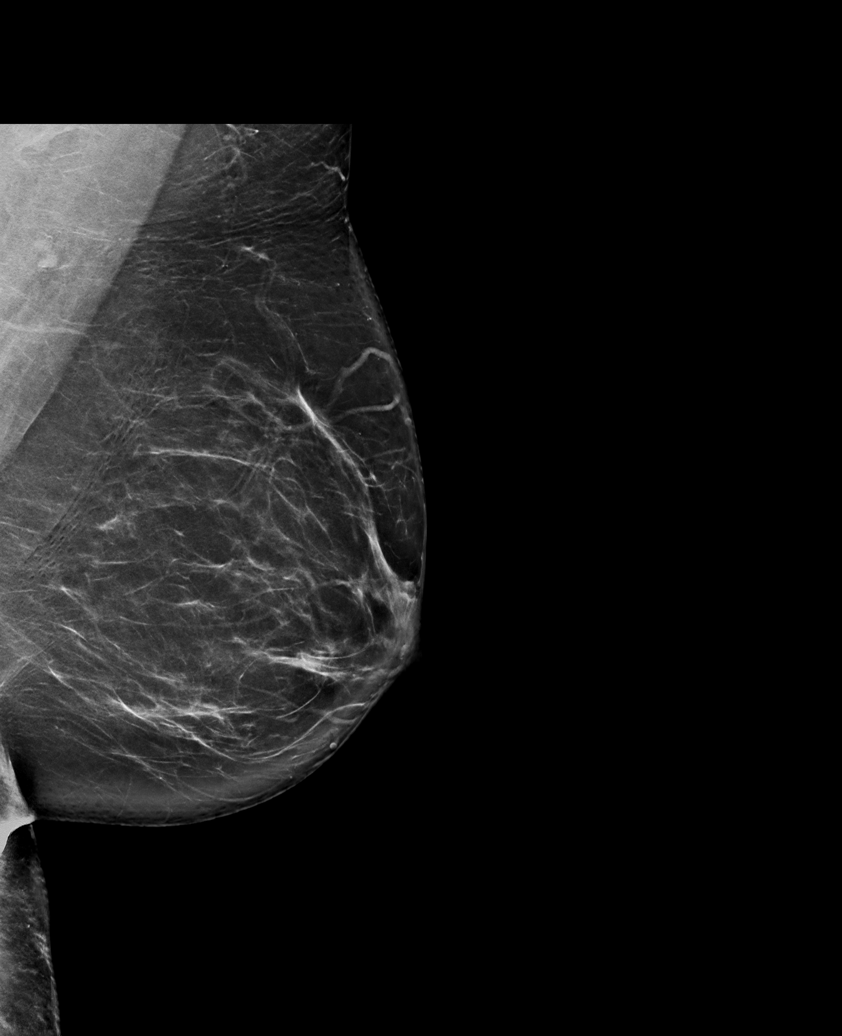

[R CC synth-2D]
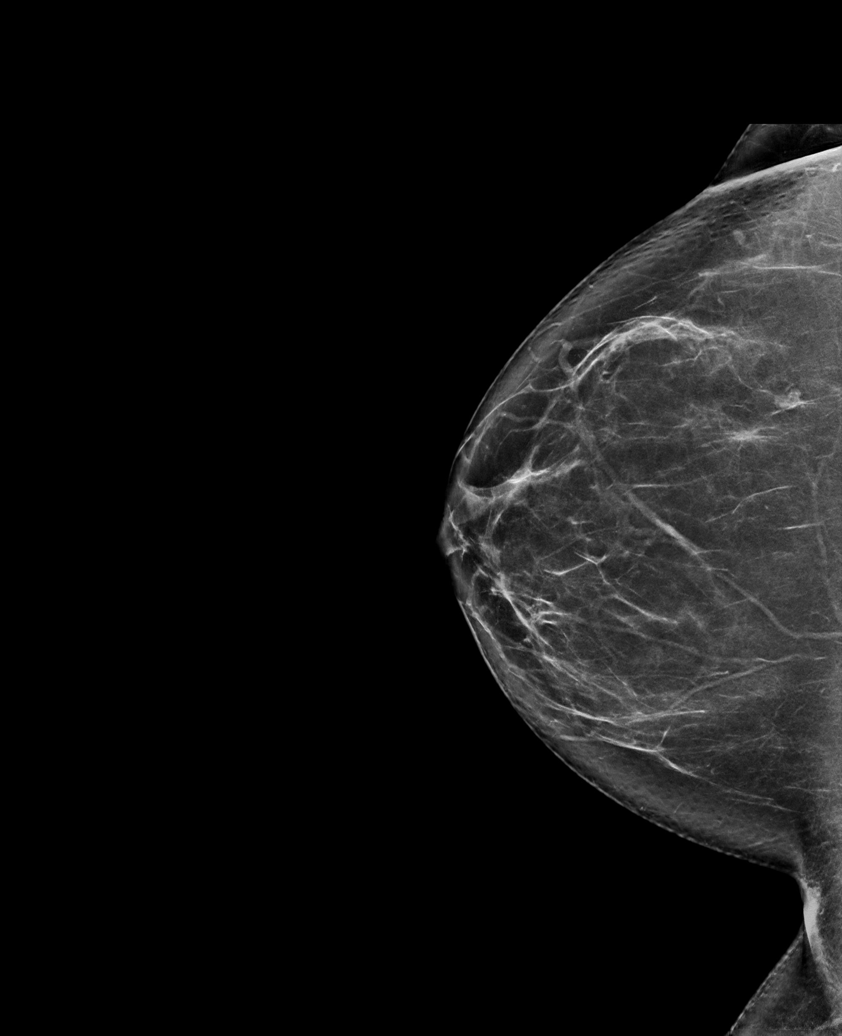

[L CC synth-2D]
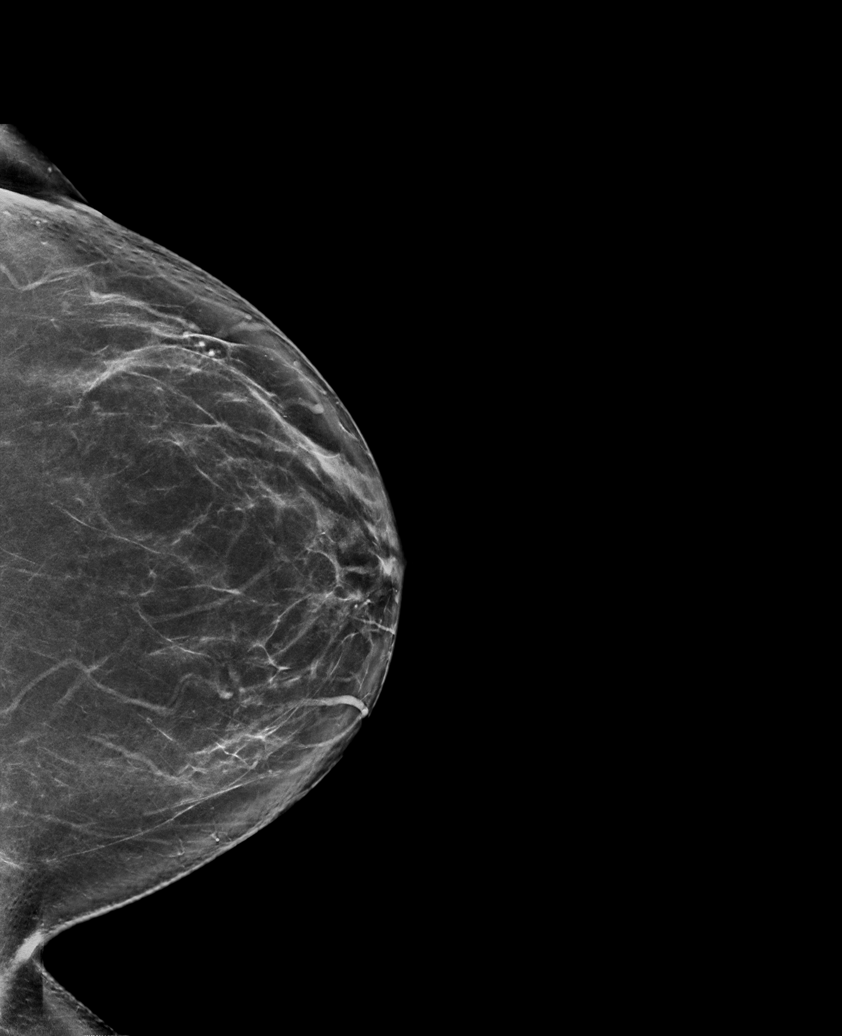

[R MLO synth-2D]
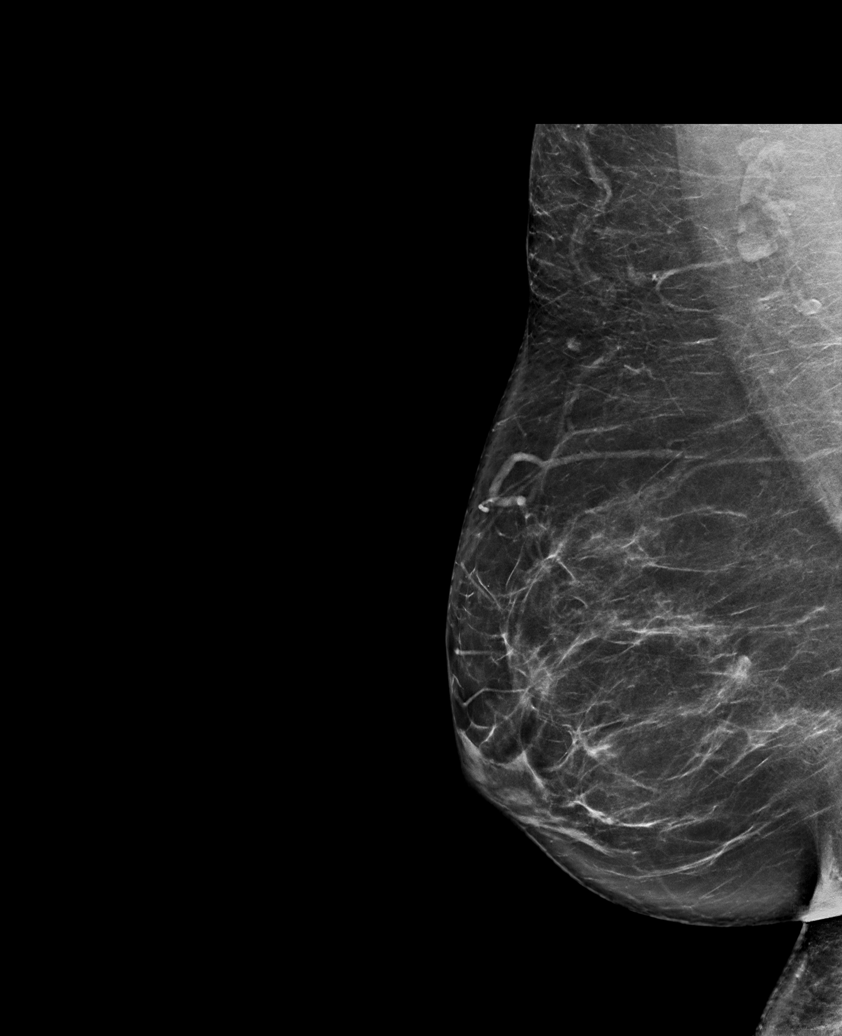

[L CC tomo · tomo slice 44/87.0]
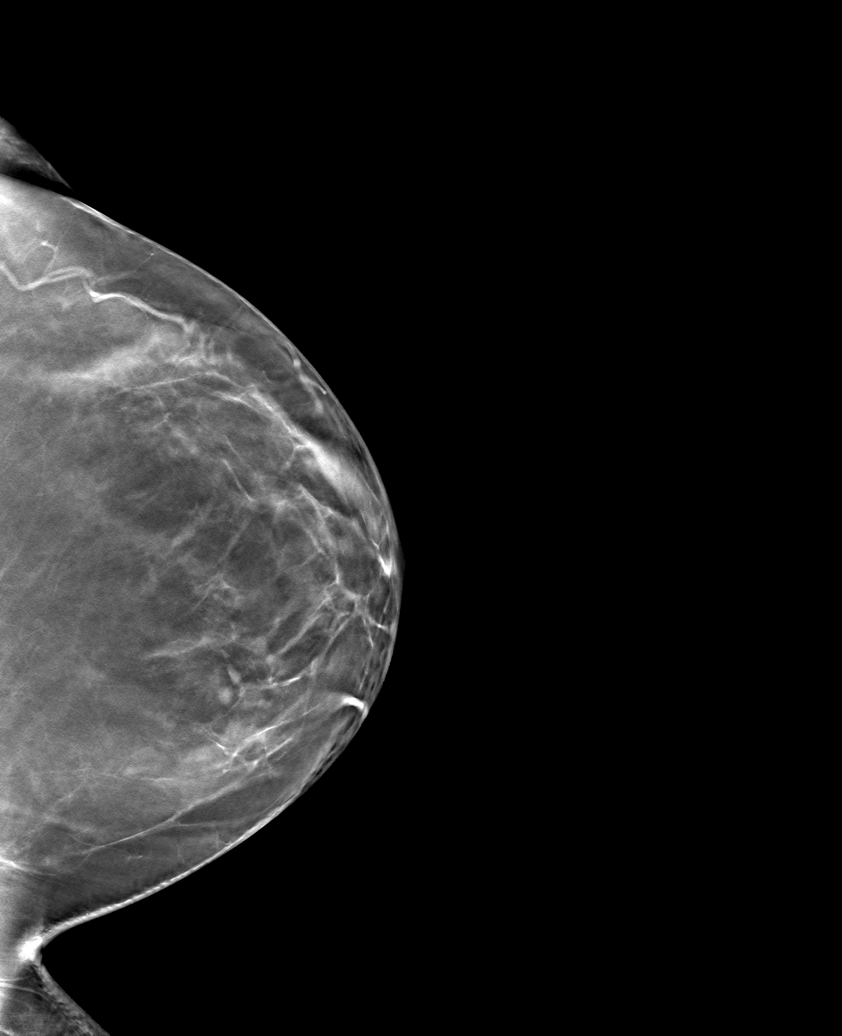

[6 of 30 positions shown; findings below may reference images not displayed]

ACR Breast Density Category b: There are scattered areas of
fibroglandular density.
FINDINGS: There are no findings suspicious for malignancy. Images were
processed with CAD.
IMPRESSION: No mammographic evidence of malignancy. A result letter of this
screening mammogram will be mailed directly to the patient.

RECOMMENDATION:
Screening mammogram in one year. (Code:[id number])

BI-RADS CATEGORY  1: Negative.

## 2020-03-24 NOTE — Progress Notes (Signed)
Complete physical exam   Patient: Deborah Jenkins   DOB: 1956-01-31   64 y.o. Female  MRN: [id number] Visit Date: 03/25/2020  Today's healthcare provider: Trinna Post, PA-C   Chief Complaint  Patient presents with  . Annual Exam   Subjective    Deborah Jenkins is a 64 y.o. female who presents today for a complete physical exam.  She reports consuming a general diet. The patient does not participate in regular exercise at present. She generally feels well. She reports sleeping fairly well. She does have additional problems to discuss today.   She reports that she has had mouth ulcers that are intermittent and very painful. She is requesting something to help for this.   Lipid/Cholesterol, Follow-up  Last lipid panel Other pertinent labs  Lab Results  Component Value Date   CHOL 279 (H) 03/25/2020   HDL 76 03/25/2020   LDLCALC 190 (H) 03/25/2020   TRIG 81 03/25/2020   CHOLHDL 3.7 03/25/2020   Lab Results  Component Value Date   ALT 16 03/25/2020   AST 18 03/25/2020   PLT 356 03/25/2020   TSH 1.340 03/25/2020     She was last seen for this 6 months ago.  Management since that visit includes lipitor 10 mg daily.  She reports poor compliance with treatment. She is having side effects. Reported hair loss so she stopped taking medication.   Symptoms: No chest pain No chest pressure/discomfort  No dyspnea No lower extremity edema  No numbness or tingling of extremity No orthopnea  No palpitations No paroxysmal nocturnal dyspnea  No speech difficulty No syncope   Current diet: in general, an "unhealthy" diet Current exercise: none  The ASCVD Risk score Mikey Bussing DC Jr., et al., 2013) failed to calculate for the following reasons:   Unable to determine if patient is Non-Hispanic African American  ---------------------------------------------------------------------------------------------------    Past Medical History:  Diagnosis Date  . Depression 2006  . GERD  (gastroesophageal reflux disease) 2006   Past Surgical History:  Procedure Laterality Date  . TONSILLECTOMY Bilateral Childhood  . TUBAL LIGATION    . WRIST FRACTURE SURGERY Right    in childhood   Social History   Socioeconomic History  . Marital status: Single    Spouse name: Not on file  . Number of children: 2  . Years of education: 14  . Highest education level: Associate degree: academic program  Occupational History  . 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 Smoking status: Current Every Day Smoker    Packs/day: 0.30    Years: 42.00    Pack years: 12.60    Types: Cigarettes  . Smokeless tobacco: Never Used  . Tobacco comment: started smoking at 63 YO; is smoking 1/3 PPD  Vaping Use  . Vaping Use: Never used  Substance and Sexual Activity  . Alcohol use: No    Alcohol/week: 0.0 standard drinks  . Drug use: No  . Sexual activity: Yes    Birth control/protection: Post-menopausal  Other Topics Concern  . Not on file  Social History Narrative      Social Determinants of Health   Financial Resource Strain:   . Difficulty of Paying Living Expenses:   Food Insecurity:   . Worried About Charity fundraiser in the Last Year:   . Arboriculturist in the Last Year:   Transportation Needs:   . Lack of Transportation (  Medical):   Marland Kitchen Lack of Transportation (Non-Medical):   Physical Activity:   . Days of Exercise per Week:   . Minutes of Exercise per Session:   Stress:   . Feeling of Stress :   Social Connections:   . Frequency of Communication with Friends and Family:   . Frequency of Social Gatherings with Friends and Family:   . Attends Religious Services:   . Active Member of Clubs or Organizations:   . Attends Archivist Meetings:   Marland Kitchen Marital Status:   Intimate Partner Violence:   . Fear of Current or Ex-Partner:   . Emotionally Abused:   Marland Kitchen Physically Abused:   . Sexually Abused:    Family Status    Relation Name Status  . Mother  Alive  . Father  Alive  . Sister [employer]  . Brother [employer]  . MGM  Deceased  . MGF  Deceased  . PGM  Deceased  . PGF  Deceased  . Sister [employer]  . Brother [employer]  . Brother Marchia Bond Deceased  . Brother [employer]  . Neg Hx  (Not Specified)   Family History  Problem Relation Age of Onset  . Hypertension Mother   . Diverticulosis Mother   . Healthy Father   . Hypertension Sister   . Healthy Sister   . Healthy Brother   . Healthy Sister   . Alcohol abuse Brother   . Alcohol abuse Brother   . Cirrhosis Brother   . Cirrhosis Brother   . Colon cancer Neg Hx   . Breast cancer Neg Hx   . Ovarian cancer Neg Hx   . Cervical cancer Neg Hx    Allergies  Allergen Reactions  . Atorvastatin     Hair loss    Patient Care Team: Virginia Crews, MD as PCP - General (Family Medicine)   Medications: Outpatient Medications Prior to Visit  Medication Sig  . aspirin 81 MG tablet Take 81 mg by mouth daily.  Marland Kitchen BIOTIN PO Take by mouth.  . cholecalciferol (VITAMIN D) 1000 UNITS tablet Take 1,000 Units by mouth daily.  Marland Kitchen MELATONIN PO Take 1 tablet by mouth daily. Reported on 08/21/2015  . Omega-3 Fatty Acids (FISH OIL) 1000 MG CAPS Take by mouth.  Marland Kitchen omeprazole (PRILOSEC) 20 MG capsule TAKE 1 CAPSULE BY MOUTH ONCE A DAY  . rosuvastatin (CRESTOR) 5 MG tablet Take 1 tablet (5 mg total) by mouth daily.  Marland Kitchen triamcinolone ointment (KENALOG) 0.5 % Apply 1 application topically 2 (two) times daily.  . [DISCONTINUED] albuterol (PROVENTIL HFA;VENTOLIN HFA) 108 (90 Base) MCG/ACT inhaler Inhale 2 puffs every 6 (six) hours as needed into the lungs for wheezing or shortness of breath. (Patient not taking: Reported on 01/10/2018)  . [DISCONTINUED] cyclobenzaprine (FLEXERIL) 10 MG tablet Take 1 tablet (10 mg total) by mouth 3 (three) times daily as needed for muscle spasms.  . [DISCONTINUED] FLUoxetine (PROZAC) 20 MG capsule TAKE 2 CAPSULES BY MOUTH  DAILY  . [DISCONTINUED] fluticasone (FLONASE) 50 MCG/ACT nasal spray Place 2 sprays into both nostrils daily.  . [DISCONTINUED] naproxen (NAPROSYN) 500 MG tablet Take 1 tablet (500 mg total) by mouth 2 (two) times daily as needed for up to 60 doses for moderate pain. (Patient not taking: Reported on 04/06/2018)  . [DISCONTINUED] naproxen (NAPROSYN) 500 MG tablet Take 1 tablet (500 mg total) by mouth 2 (two) times daily with a meal.   No facility-administered medications prior to visit.  Review of Systems  Constitutional: Negative.   HENT: Negative.   Eyes: Negative.   Respiratory: Negative.   Cardiovascular: Negative.   Gastrointestinal: Negative.   Endocrine: Negative.   Genitourinary: Negative.   Musculoskeletal: Negative.   Skin: Negative.   Allergic/Immunologic: Negative.   Neurological: Negative.   Hematological: Negative.   Psychiatric/Behavioral: Negative.       Objective    BP (!) 140/95 (BP Location: Right Arm)   Pulse 75   Temp 98 F (36.7 C)   Ht 5\' 7"  (1.702 m)   Wt 221 lb (100.2 kg)   BMI 34.61 kg/m    Physical Exam Constitutional:      Appearance: Normal appearance.  HENT:     Right Ear: Tympanic membrane normal.     Left Ear: Tympanic membrane normal.  Cardiovascular:     Rate and Rhythm: Normal rate and regular rhythm.     Pulses: Normal pulses.     Heart sounds: Normal heart sounds.  Pulmonary:     Effort: Pulmonary effort is normal.     Breath sounds: Normal breath sounds.  Skin:    General: Skin is warm and dry.  Neurological:     Mental Status: She is alert and oriented to person, place, and time. Mental status is at baseline.  Psychiatric:        Mood and Affect: Mood normal.        Behavior: Behavior normal.       Last depression screening scores PHQ 2/9 Scores 03/25/2020 03/22/2019 04/06/2018  PHQ - 2 Score 0 3 4  PHQ- 9 Score 5 7 6    Last fall risk screening Fall Risk  03/25/2020  Falls in the past year? 0  Number falls in past  yr: 0  Injury with Fall? 0  Risk for fall due to : No Fall Risks  Follow up Falls evaluation completed   Last Audit-C alcohol use screening Alcohol Use Disorder Test (AUDIT) 03/25/2020  1. How often do you have a drink containing alcohol? 0  2. How many drinks containing alcohol do you have on a typical day when you are drinking? 0  3. How often do you have six or more drinks on one occasion? 0  AUDIT-C Score 0  Alcohol Brief Interventions/Follow-up AUDIT Score <7 follow-up not indicated   A score of 3 or more in women, and 4 or more in men indicates increased risk for alcohol abuse, EXCEPT if all of the points are from question 1   Results for orders placed or performed in visit on 03/25/20  CBC with Differential/Platelet  Result Value Ref Range   WBC 5.6 3.4 - 10.8 x10E3/uL   RBC 4.68 3.77 - 5.28 x10E6/uL   Hemoglobin 12.2 11.1 - 15.9 g/dL   Hematocrit 38.5 34.0 - 46.6 %   MCV 82 79 - 97 fL   MCH 26.1 (L) 26.6 - 33.0 pg   MCHC 31.7 31 - 35 g/dL   RDW 13.6 11.7 - 15.4 %   Platelets 356 150 - 450 x10E3/uL   Neutrophils 57 Not Estab. %   Lymphs 33 Not Estab. %   Monocytes 7 Not Estab. %   Eos 2 Not Estab. %   Basos 1 Not Estab. %   Neutrophils Absolute 3.2 1 - 7 x10E3/uL   Lymphocytes Absolute 1.8 0 - 3 x10E3/uL   Monocytes Absolute 0.4 0 - 0 x10E3/uL   EOS (ABSOLUTE) 0.1 0.0 - 0.4 x10E3/uL   Basophils Absolute 0.0 0 -  0 x10E3/uL   Immature Granulocytes 0 Not Estab. %   Immature Grans (Abs) 0.0 0.0 - 0.1 x10E3/uL  Comprehensive metabolic panel  Result Value Ref Range   Glucose 95 65 - 99 mg/dL   BUN 9 8 - 27 mg/dL   Creatinine, Ser 0.85 0.57 - 1.00 mg/dL   GFR calc non Af Amer 74 >59 mL/min/1.73   GFR calc Af Amer 86 >59 mL/min/1.73   BUN/Creatinine Ratio 11 (L) 12 - 28   Sodium 139 134 - 144 mmol/L   Potassium 4.7 3.5 - 5.2 mmol/L   Chloride 104 96 - 106 mmol/L   CO2 21 20 - 29 mmol/L   Calcium 9.1 8.7 - 10.3 mg/dL   Total Protein 7.1 6.0 - 8.5 g/dL   Albumin 4.1  3.8 - 4.8 g/dL   Globulin, Total 3.0 1.5 - 4.5 g/dL   Albumin/Globulin Ratio 1.4 1.2 - 2.2   Bilirubin Total 0.2 0.0 - 1.2 mg/dL   Alkaline Phosphatase 83 48 - 121 IU/L   AST 18 0 - 40 IU/L   ALT 16 0 - 32 IU/L  Lipid panel  Result Value Ref Range   Cholesterol, Total 279 (H) 100 - 199 mg/dL   Triglycerides 81 0 - 149 mg/dL   HDL 76 >39 mg/dL   VLDL Cholesterol Cal 13 5 - 40 mg/dL   LDL Chol Calc (NIH) 190 (H) 0 - 99 mg/dL   Chol/HDL Ratio 3.7 0.0 - 4.4 ratio  TSH  Result Value Ref Range   TSH 1.340 0.450 - 4.500 uIU/mL    Assessment & Plan    1. Annual physical exam  - CBC with Differential/Platelet - Comprehensive metabolic panel - TSH  2. Encounter for screening mammogram for malignant neoplasm of breast  - MM Digital Screening; Future  3. Family history of colonic polyps  She is due for colonoscopy. She reports she will schedule this.  4. Pure hypercholesterolemia  Lipitor 10 mg QHS restarted. If intolerant due to side effects, may consider repatha.  - Lipid panel  5. Anxiety  - FLUoxetine (PROZAC) 20 MG capsule; Take 2 capsules (40 mg total) by mouth daily.  Dispense: 180 capsule; Refill: 3  6. Seasonal allergic rhinitis due to pollen  - fluticasone (FLONASE) 50 MCG/ACT nasal spray; Place 2 sprays into both nostrils daily.  Dispense: 16 g; Refill: 11  7. Mouth ulcers  [facility] mouth[facility] to [facility].    Routine Health Maintenance and Physical Exam  Exercise Activities and Dietary recommendations Goals   None      There is no immunization history on file for this patient.  Health Maintenance  Topic Date Due  . COVID-19 Vaccine (1) Never done  . COLONOSCOPY  05/17/2019  . INFLUENZA VACCINE  04/05/2020  . MAMMOGRAM  05/08/2021  . TETANUS/TDAP  07/20/2022  . PAP SMEAR-Modifier  01/11/2023  . Hepatitis C Screening  Completed  . HIV Screening  Completed    Discussed health benefits of physical activity, and encouraged her to engage in regular  exercise appropriate for her age and condition.    No follow-ups on file.     ITrinna Post, PA-C, have reviewed all documentation for this visit. The documentation on 03/27/20 for the exam, diagnosis, procedures, and orders are all accurate and complete.    Paulene Floor  [facility] [phone] (phone) [phone] (fax)  [place]

## 2020-03-25 ENCOUNTER — Other Ambulatory Visit: Payer: Self-pay | Admitting: Physician Assistant

## 2020-03-25 ENCOUNTER — Ambulatory Visit (INDEPENDENT_AMBULATORY_CARE_PROVIDER_SITE_OTHER): Payer: 59 | Admitting: Physician Assistant

## 2020-03-25 ENCOUNTER — Other Ambulatory Visit: Payer: Self-pay

## 2020-03-25 ENCOUNTER — Encounter: Payer: Self-pay | Admitting: Family Medicine

## 2020-03-25 ENCOUNTER — Encounter: Payer: Self-pay | Admitting: Physician Assistant

## 2020-03-25 VITALS — BP 140/95 | HR 75 | Temp 98.0°F | Ht 67.0 in | Wt 221.0 lb

## 2020-03-25 DIAGNOSIS — Z1231 Encounter for screening mammogram for malignant neoplasm of breast: Secondary | ICD-10-CM

## 2020-03-25 DIAGNOSIS — Z8371 Family history of colonic polyps: Secondary | ICD-10-CM | POA: Diagnosis not present

## 2020-03-25 DIAGNOSIS — E7800 Pure hypercholesterolemia, unspecified: Secondary | ICD-10-CM | POA: Diagnosis not present

## 2020-03-25 DIAGNOSIS — K121 Other forms of stomatitis: Secondary | ICD-10-CM

## 2020-03-25 DIAGNOSIS — F419 Anxiety disorder, unspecified: Secondary | ICD-10-CM

## 2020-03-25 DIAGNOSIS — Z0000 Encounter for general adult medical examination without abnormal findings: Secondary | ICD-10-CM | POA: Diagnosis not present

## 2020-03-25 DIAGNOSIS — J301 Allergic rhinitis due to pollen: Secondary | ICD-10-CM | POA: Diagnosis not present

## 2020-03-25 DIAGNOSIS — Z83719 Family history of colon polyps, unspecified: Secondary | ICD-10-CM

## 2020-03-25 MED ORDER — FLUOXETINE HCL 20 MG PO CAPS: 40.0000 mg | ORAL_CAPSULE | Freq: Every day | ORAL | 3 refills | Status: DC

## 2020-03-25 MED ORDER — FLUTICASONE PROPIONATE 50 MCG/ACT NA SUSP: 2.0000 | Freq: Every day | NASAL | 11 refills | Status: DC

## 2020-03-26 LAB — CBC WITH DIFFERENTIAL/PLATELET
Basophils Absolute: 0 10*3/uL (ref 0.0–0.2)
Basos: 1 %
EOS (ABSOLUTE): 0.1 10*3/uL (ref 0.0–0.4)
Eos: 2 %
Hematocrit: 38.5 % (ref 34.0–46.6)
Hemoglobin: 12.2 g/dL (ref 11.1–15.9)
Immature Grans (Abs): 0 10*3/uL (ref 0.0–0.1)
Immature Granulocytes: 0 %
Lymphocytes Absolute: 1.8 10*3/uL (ref 0.7–3.1)
Lymphs: 33 %
MCH: 26.1 pg — ABNORMAL LOW (ref 26.6–33.0)
MCHC: 31.7 g/dL (ref 31.5–35.7)
MCV: 82 fL (ref 79–97)
Monocytes Absolute: 0.4 10*3/uL (ref 0.1–0.9)
Monocytes: 7 %
Neutrophils Absolute: 3.2 10*3/uL (ref 1.4–7.0)
Neutrophils: 57 %
Platelets: 356 10*3/uL (ref 150–450)
RBC: 4.68 x10E6/uL (ref 3.77–5.28)
RDW: 13.6 % (ref 11.7–15.4)
WBC: 5.6 10*3/uL (ref 3.4–10.8)

## 2020-03-26 LAB — COMPREHENSIVE METABOLIC PANEL
ALT: 16 IU/L (ref 0–32)
AST: 18 IU/L (ref 0–40)
Albumin/Globulin Ratio: 1.4 (ref 1.2–2.2)
Albumin: 4.1 g/dL (ref 3.8–4.8)
Alkaline Phosphatase: 83 IU/L (ref 48–121)
BUN/Creatinine Ratio: 11 — ABNORMAL LOW (ref 12–28)
BUN: 9 mg/dL (ref 8–27)
Bilirubin Total: 0.2 mg/dL (ref 0.0–1.2)
CO2: 21 mmol/L (ref 20–29)
Calcium: 9.1 mg/dL (ref 8.7–10.3)
Chloride: 104 mmol/L (ref 96–106)
Creatinine, Ser: 0.85 mg/dL (ref 0.57–1.00)
GFR calc Af Amer: 86 mL/min/{1.73_m2} (ref >59)
GFR calc non Af Amer: 74 mL/min/{1.73_m2} (ref >59)
Globulin, Total: 3 g/dL (ref 1.5–4.5)
Glucose: 95 mg/dL (ref 65–99)
Potassium: 4.7 mmol/L (ref 3.5–5.2)
Sodium: 139 mmol/L (ref 134–144)
Total Protein: 7.1 g/dL (ref 6.0–8.5)

## 2020-03-26 LAB — LIPID PANEL
Chol/HDL Ratio: 3.7 ratio (ref 0.0–4.4)
Cholesterol, Total: 279 mg/dL — ABNORMAL HIGH (ref 100–199)
HDL: 76 mg/dL (ref >39)
LDL Chol Calc (NIH): 190 mg/dL — ABNORMAL HIGH (ref 0–99)
Triglycerides: 81 mg/dL (ref 0–149)
VLDL Cholesterol Cal: 13 mg/dL (ref 5–40)

## 2020-03-26 LAB — TSH: TSH: 1.34 u[IU]/mL (ref 0.450–4.500)

## 2020-03-27 ENCOUNTER — Encounter: Payer: Self-pay | Admitting: Physician Assistant

## 2020-03-27 MED ORDER — MAGIC MOUTHWASH W/LIDOCAINE: 5.0000 mL | Freq: Three times a day (TID) | ORAL | 0 refills | Status: DC | PRN

## 2020-03-30 ENCOUNTER — Other Ambulatory Visit: Payer: Self-pay | Admitting: Family Medicine

## 2020-03-31 ENCOUNTER — Encounter: Payer: Self-pay | Admitting: Physician Assistant

## 2020-04-03 ENCOUNTER — Telehealth: Payer: Self-pay

## 2020-04-03 NOTE — Telephone Encounter (Signed)
Tired calling patient to schedule a follow-up appointment for HLD. If patient calls back okay for [facility] to schedule appointment.

## 2020-04-03 NOTE — Telephone Encounter (Signed)
-----   Message from Trinna Post, [place] sent at 03/27/2020 12:22 PM EDT ----- Can we schedule for 6 week HLD follow up? THanks

## 2020-04-06 NOTE — Telephone Encounter (Signed)
Patient returned call and scheduled appointment with [facility].

## 2020-04-15 ENCOUNTER — Encounter: Payer: Self-pay | Admitting: Family Medicine

## 2020-04-27 ENCOUNTER — Ambulatory Visit
Admission: EM | Admit: 2020-04-27 | Discharge: 2020-04-27 | Disposition: A | Discharge status: Home / Self Care | Payer: 59

## 2020-04-27 DIAGNOSIS — J0100 Acute maxillary sinusitis, unspecified: Secondary | ICD-10-CM | POA: Diagnosis not present

## 2020-04-27 DIAGNOSIS — J209 Acute bronchitis, unspecified: Secondary | ICD-10-CM | POA: Diagnosis not present

## 2020-04-27 DIAGNOSIS — R059 Cough, unspecified: Secondary | ICD-10-CM

## 2020-04-27 DIAGNOSIS — R05 Cough: Secondary | ICD-10-CM | POA: Diagnosis not present

## 2020-04-27 MED ORDER — PREDNISONE 10 MG (21) PO TBPK: ORAL_TABLET | Freq: Every day | ORAL | 0 refills | Status: DC

## 2020-04-27 MED ORDER — BENZONATATE 100 MG PO CAPS: 100.0000 mg | ORAL_CAPSULE | Freq: Three times a day (TID) | ORAL | 0 refills | Status: DC | PRN

## 2020-04-27 MED ORDER — ALBUTEROL SULFATE HFA 108 (90 BASE) MCG/ACT IN AERS: 2.0000 | INHALATION_SPRAY | RESPIRATORY_TRACT | 0 refills | Status: DC | PRN

## 2020-04-27 MED ORDER — AMOXICILLIN-POT CLAVULANATE 875-125 MG PO TABS: 1.0000 | ORAL_TABLET | Freq: Two times a day (BID) | ORAL | 0 refills | Status: DC

## 2020-04-27 NOTE — ED Provider Notes (Signed)
Roderic Palau    CSN: [id number] Arrival date & time: 04/27/20  0825      History   Chief Complaint Chief Complaint  Patient presents with  . Cough  . Nasal Congestion    HPI Deborah Jenkins is a 64 y.o. female.   Patient presents with 4-day history of cough productive of green phlegm, nasal congestion, sinus pressure, sinus drainage, fatigue.  She has treated her symptoms at home with NyQuil and Alka-Seltzer plus.  She denies fever, chills, shortness of breath, vomiting, diarrhea, or other symptoms.  She is fully vaccinated for COVID.  The history is provided by the patient.    Past Medical History:  Diagnosis Date  . Depression 2006  . GERD (gastroesophageal reflux disease) 2006    Patient Active Problem List   Diagnosis Date Noted  . Class 1 obesity without serious comorbidity with body mass index (BMI) of 33.0 to 33.9 in adult 03/22/2019  . Psoriasis 03/22/2019  . Anxiety 04/06/2018  . Hyperlipidemia 04/06/2018  . Tobacco use disorder 01/10/2018  . GERD (gastroesophageal reflux disease) 01/10/2018  . Allergic rhinitis 08/21/2015  . Depression 02/19/2014    Past Surgical History:  Procedure Laterality Date  . TONSILLECTOMY Bilateral Childhood  . TUBAL LIGATION    . WRIST FRACTURE SURGERY Right    in childhood    OB History   No obstetric history on file.      Home Medications    Prior to Admission medications   Medication Sig Start Date End Date Taking? Authorizing Provider  Chlorphen-Phenyleph-ASA (ALKA-SELTZER PLUS COLD PO) Take by mouth.   Yes [provider]  Pseudoeph-Doxylamine-DM-APAP (NYQUIL PO) Take by mouth.   Yes [provider]  albuterol (VENTOLIN HFA) 108 (90 Base) MCG/ACT inhaler Inhale 2 puffs into the lungs every 4 (four) hours as needed for wheezing or shortness of breath. 04/27/20   Sharion Balloon, NP  amoxicillin-clavulanate (AUGMENTIN) 875-125 MG tablet Take 1 tablet by mouth every 12 (twelve) hours. 04/27/20    Sharion Balloon, NP  aspirin 81 MG tablet Take 81 mg by mouth daily.    [provider]  benzonatate (TESSALON) 100 MG capsule Take 1 capsule (100 mg total) by mouth 3 (three) times daily as needed for cough. 04/27/20   Sharion Balloon, NP  BIOTIN PO Take by mouth.    [provider]  cholecalciferol (VITAMIN D) 1000 UNITS tablet Take 1,000 Units by mouth daily.    [provider]  FLUoxetine (PROZAC) 20 MG capsule Take 2 capsules (40 mg total) by mouth daily. 03/25/20   Trinna Post, PA-C  fluticasone (FLONASE) 50 MCG/ACT nasal spray Place 2 sprays into both nostrils daily. 03/25/20   Trinna Post, PA-C  magic mouthwash w/lidocaine SOLN Take 5 mLs by mouth 3 (three) times daily as needed for mouth pain. 03/27/20   Trinna Post, PA-C  MELATONIN PO Take 1 tablet by mouth daily. Reported on 08/21/2015    [provider]  Omega-3 Fatty Acids (FISH OIL) 1000 MG CAPS Take by mouth.    [provider]  omeprazole (PRILOSEC) 20 MG capsule TAKE 1 CAPSULE BY MOUTH ONCE A DAY 01/13/20   Bacigalupo, Dionne Bucy, MD  predniSONE (STERAPRED UNI-PAK 21 TAB) 10 MG (21) TBPK tablet Take by mouth daily. As directed 04/27/20   Sharion Balloon, NP  rosuvastatin (CRESTOR) 5 MG tablet TAKE 1 TABLET BY MOUTH DAILY. 03/30/20   Virginia Crews, MD  triamcinolone ointment (KENALOG)  0.5 % Apply 1 application topically 2 (two) times daily. 03/22/19   Virginia Crews, MD    Family History Family History  Problem Relation Age of Onset  . Hypertension Mother   . Diverticulosis Mother   . Healthy Father   . Hypertension Sister   . Healthy Sister   . Healthy Brother   . Healthy Sister   . Alcohol abuse Brother   . Alcohol abuse Brother   . Cirrhosis Brother   . Cirrhosis Brother   . Colon cancer Neg Hx   . Breast cancer Neg Hx   . Ovarian cancer Neg Hx   . Cervical cancer Neg Hx     Social History Social History   Tobacco Use  . Smoking status: Current  Every Day Smoker    Packs/day: 0.30    Years: 42.00    Pack years: 12.60    Types: Cigarettes  . Smokeless tobacco: Never Used  . Tobacco comment: started smoking at 64 YO; is smoking 1/3 PPD  Vaping Use  . Vaping Use: Never used  Substance Use Topics  . Alcohol use: No    Alcohol/week: 0.0 standard drinks  . Drug use: No     Allergies   Atorvastatin   Review of Systems Review of Systems  Constitutional: Positive for fatigue. Negative for chills and fever.  HENT: Positive for congestion, rhinorrhea and sinus pressure. Negative for ear pain and sore throat.   Eyes: Negative for pain and visual disturbance.  Respiratory: Positive for cough. Negative for shortness of breath.   Cardiovascular: Negative for chest pain and palpitations.  Gastrointestinal: Negative for abdominal pain, diarrhea and vomiting.  Genitourinary: Negative for dysuria and hematuria.  Musculoskeletal: Negative for arthralgias and back pain.  Skin: Negative for color change and rash.  Neurological: Negative for seizures and syncope.  All other systems reviewed and are negative.    Physical Exam Triage Vital Signs ED Triage Vitals  Enc Vitals Group     BP      Pulse      Resp      Temp      Temp src      SpO2      Weight      Height      Head Circumference      Peak Flow      Pain Score      Pain Loc      Pain Edu?      Excl. in [place]?    No data found.  Updated Vital Signs BP 138/89 (BP Location: Right Arm)   Pulse 96   Temp 99.2 F (37.3 C) (Oral)   Resp 20   SpO2 97%   Visual Acuity Right Eye Distance:   Left Eye Distance:   Bilateral Distance:    Right Eye Near:   Left Eye Near:    Bilateral Near:     Physical Exam Vitals and nursing note reviewed.  Constitutional:      General: She is not in acute distress.    Appearance: She is well-developed.  HENT:     Head: Normocephalic and atraumatic.     Right Ear: Tympanic membrane normal.     Left Ear: Tympanic membrane  normal.     Nose: Congestion and rhinorrhea present.     Mouth/Throat:     Mouth: Mucous membranes are moist.     Pharynx: Oropharynx is clear.  Eyes:     Conjunctiva/sclera: Conjunctivae normal.  Cardiovascular:  Rate and Rhythm: Normal rate and regular rhythm.     Heart sounds: No murmur heard.   Pulmonary:     Effort: Pulmonary effort is normal. No respiratory distress.     Breath sounds: Normal breath sounds.     Comments: Cough intermittently during exam.  Abdominal:     Palpations: Abdomen is soft.     Tenderness: There is no abdominal tenderness. There is no guarding or rebound.  Musculoskeletal:     Cervical back: Neck supple.  Skin:    General: Skin is warm and dry.     Findings: No rash.  Neurological:     General: No focal deficit present.     Mental Status: She is alert and oriented to person, place, and time.     Gait: Gait normal.  Psychiatric:        Mood and Affect: Mood normal.        Behavior: Behavior normal.      UC Treatments / Results  Labs (all labs ordered are listed, but only abnormal results are displayed) Labs Reviewed  NOVEL CORONAVIRUS, NAA    EKG   Radiology No results found.  Procedures Procedures (including critical care time)  Medications Ordered in UC Medications - No data to display  Initial Impression / Assessment and Plan / UC Course  I have reviewed the triage vital signs and the nursing notes.  Pertinent labs & imaging results that were available during my care of the patient were reviewed by me and considered in my medical decision making (see chart for details).   Acute sinusitis, acute bronchitis.  Treating with albuterol inhaler, prednisone, Augmentin, Tessalon Perles.  PCR COVID pending.  Instructed patient to self quarantine until the test result is back.  Discussed symptomatic treatment, including Tylenol, rest, hydration.  Instructed patient to follow-up with her PCP if her symptoms or not improving.  Patient  agrees to plan of care.     Final Clinical Impressions(s) / UC Diagnoses   Final diagnoses:  Acute non-recurrent maxillary sinusitis  Acute bronchitis, unspecified organism     Discharge Instructions     Use the albuterol inhaler as directed.  Take the prednisone and Augmentin as directed.  Take the [facility] as needed for cough.    Follow up with your primary care provider if your symptoms are not improving.       ED Prescriptions    Medication Sig Dispense Auth. Provider   albuterol (VENTOLIN HFA) 108 (90 Base) MCG/ACT inhaler Inhale 2 puffs into the lungs every 4 (four) hours as needed for wheezing or shortness of breath. 18 g Sharion Balloon, NP   predniSONE (STERAPRED UNI-PAK 21 TAB) 10 MG (21) TBPK tablet Take by mouth daily. As directed 21 tablet Sharion Balloon, NP   amoxicillin-clavulanate (AUGMENTIN) 875-125 MG tablet Take 1 tablet by mouth every 12 (twelve) hours. 14 tablet Sharion Balloon, NP   benzonatate (TESSALON) 100 MG capsule Take 1 capsule (100 mg total) by mouth 3 (three) times daily as needed for cough. 21 capsule Sharion Balloon, NP     PDMP not reviewed this encounter.   Sharion Balloon, NP 04/27/20 [phone]

## 2020-04-27 NOTE — ED Triage Notes (Signed)
Pt presents with cough, nasal congestion and low energy x 3 days. Denies fever, chills. NyQuil and Alka Seltzer plus gives no relief.

## 2020-04-27 NOTE — Discharge Instructions (Signed)
Use the albuterol inhaler as directed.  Take the prednisone and Augmentin as directed.  Take the [facility] as needed for cough.    Follow up with your primary care provider if your symptoms are not improving.

## 2020-04-29 LAB — NOVEL CORONAVIRUS, NAA: SARS-CoV-2, NAA: NOT DETECTED

## 2020-04-29 LAB — SARS-COV-2, NAA 2 DAY TAT

## 2020-05-12 ENCOUNTER — Ambulatory Visit: Payer: 59 | Admitting: Physician Assistant

## 2020-05-12 ENCOUNTER — Encounter: Payer: Self-pay | Admitting: Physician Assistant

## 2020-05-12 ENCOUNTER — Other Ambulatory Visit: Payer: Self-pay

## 2020-05-12 VITALS — BP 122/78 | HR 84 | Temp 98.6°F | Wt 222.4 lb

## 2020-05-12 DIAGNOSIS — E7800 Pure hypercholesterolemia, unspecified: Secondary | ICD-10-CM | POA: Diagnosis not present

## 2020-05-12 DIAGNOSIS — F17200 Nicotine dependence, unspecified, uncomplicated: Secondary | ICD-10-CM

## 2020-05-12 NOTE — Progress Notes (Signed)
Established patient visit   Patient: Deborah Jenkins   DOB: 27-Sep-1955   64 y.o. Female  MRN: [id number] Visit Date: 05/12/2020  Today's healthcare provider: Trinna Post, PA-C   Chief Complaint  Patient presents with  . Hyperlipidemia  I,Porsha C McClurkin,acting as a scribe for Trinna Post, PA-C.,have documented all relevant documentation on the behalf of Trinna Post, PA-C,as directed by  Trinna Post, PA-C while in the presence of Trinna Post, PA-C.  Subjective    HPI  Lipid/Cholesterol, Follow-up  Last lipid panel Other pertinent labs  Lab Results  Component Value Date   CHOL 279 (H) 03/25/2020   HDL 76 03/25/2020   LDLCALC 190 (H) 03/25/2020   TRIG 81 03/25/2020   CHOLHDL 3.7 03/25/2020   Lab Results  Component Value Date   ALT 16 03/25/2020   AST 18 03/25/2020   PLT 356 03/25/2020   TSH 1.340 03/25/2020     She was last seen for this 6 weeks ago.  Management since that visit includes started Rosuvastatin 5 MG rather than atorvastatin because she experience hair loss with atorvastatin previously. She denies side effects from current medication.   She reports good compliance with treatment. She is not having side effects.   Currently smoking - 1/2 pack day.   Symptoms: No chest pain No chest pressure/discomfort  No dyspnea No lower extremity edema  No numbness or tingling of extremity No orthopnea  No palpitations No paroxysmal nocturnal dyspnea  No speech difficulty No syncope   Current diet: well balanced Current exercise: housecleaning and walking  The ASCVD Risk score ([place]., et al., 2013) failed to calculate for the following reasons:   Unable to determine if patient is Non-Hispanic African American  ---------------------------------------------------------------------------------------------------       Medications: Outpatient Medications Prior to Visit  Medication Sig  . albuterol (VENTOLIN HFA) 108 (90 Base)  MCG/ACT inhaler Inhale 2 puffs into the lungs every 4 (four) hours as needed for wheezing or shortness of breath.  Marland Kitchen aspirin 81 MG tablet Take 81 mg by mouth daily.  . benzonatate (TESSALON) 100 MG capsule Take 1 capsule (100 mg total) by mouth 3 (three) times daily as needed for cough.  . Chlorphen-Phenyleph-ASA (ALKA-Jenkins PLUS COLD PO) Take by mouth.  . cholecalciferol (VITAMIN D) 1000 UNITS tablet Take 1,000 Units by mouth daily.  Marland Kitchen FLUoxetine (PROZAC) 20 MG capsule Take 2 capsules (40 mg total) by mouth daily.  . fluticasone (FLONASE) 50 MCG/ACT nasal spray Place 2 sprays into both nostrils daily.  . magic mouthwash w/lidocaine SOLN Take 5 mLs by mouth 3 (three) times daily as needed for mouth pain.  Marland Kitchen MELATONIN PO Take 1 tablet by mouth daily. Reported on 08/21/2015  . Omega-3 Fatty Acids (FISH OIL) 1000 MG CAPS Take by mouth.  Marland Kitchen omeprazole (PRILOSEC) 20 MG capsule TAKE 1 CAPSULE BY MOUTH ONCE A DAY  . rosuvastatin (CRESTOR) 5 MG tablet TAKE 1 TABLET BY MOUTH DAILY.  Marland Kitchen triamcinolone ointment (KENALOG) 0.5 % Apply 1 application topically 2 (two) times daily.  Marland Kitchen amoxicillin-clavulanate (AUGMENTIN) 875-125 MG tablet Take 1 tablet by mouth every 12 (twelve) hours. (Patient not taking: Reported on 05/12/2020)  . BIOTIN PO Take by mouth. (Patient not taking: Reported on 05/12/2020)  . predniSONE (STERAPRED UNI-PAK 21 TAB) 10 MG (21) TBPK tablet Take by mouth daily. As directed (Patient not taking: Reported on 05/12/2020)  . Pseudoeph-Doxylamine-DM-APAP (NYQUIL PO) Take by mouth. (Patient not taking: Reported  on 05/12/2020)   No facility-administered medications prior to visit.    Review of Systems     Objective    BP 122/78 (BP Location: Left Arm, Patient Position: Sitting, Cuff Size: Normal)   Pulse 84   Temp 98.6 F (37 C) (Oral)   Wt 222 lb 6.4 oz (100.9 kg)   SpO2 98%   BMI 34.83 kg/m     Physical Exam Constitutional:      Appearance: Normal appearance.  Cardiovascular:     Rate  and Rhythm: Normal rate and regular rhythm.     Heart sounds: Normal heart sounds.  Pulmonary:     Effort: Pulmonary effort is normal.     Breath sounds: Normal breath sounds.  Skin:    General: Skin is warm and dry.  Neurological:     Mental Status: She is alert and oriented to person, place, and time. Mental status is at baseline.  Psychiatric:        Mood and Affect: Mood normal.        Behavior: Behavior normal.       No results found for any visits on 05/12/20.  Assessment & Plan    1. Pure hypercholesterolemia  Recheck and adjust pending lab results.   - Lipid panel  2. Tobacco use disorder  Counseled on importance of stopping smoking for reducing cholesterol and heart disease. Patient is not interested in quitting at this time.      No follow-ups on file.      ITrinna Post, PA-C, have reviewed all documentation for this visit. The documentation on 05/12/20 for the exam, diagnosis, procedures, and orders are all accurate and complete.  The entirety of the information documented in the History of Present Illness, Review of Systems and Physical Exam were personally obtained by me. Portions of this information were initially documented by [facility] and reviewed by me for thoroughness and accuracy.     Paulene Floor  [facility] [phone] (phone) [phone] (fax)  [place]

## 2020-05-13 ENCOUNTER — Telehealth: Payer: Self-pay

## 2020-05-13 DIAGNOSIS — E7800 Pure hypercholesterolemia, unspecified: Secondary | ICD-10-CM

## 2020-05-13 LAB — LIPID PANEL
Chol/HDL Ratio: 3 ratio (ref 0.0–4.4)
Cholesterol, Total: 233 mg/dL — ABNORMAL HIGH (ref 100–199)
HDL: 77 mg/dL (ref >39)
LDL Chol Calc (NIH): 141 mg/dL — ABNORMAL HIGH (ref 0–99)
Triglycerides: 87 mg/dL (ref 0–149)
VLDL Cholesterol Cal: 15 mg/dL (ref 5–40)

## 2020-05-13 NOTE — Telephone Encounter (Signed)
Called patient and no answer left voicemail message for patient to return call. If patient return call okay for PEC to advise of message.

## 2020-05-13 NOTE — Telephone Encounter (Signed)
-----   Message from Trinna Post, [place] sent at 05/13/2020  9:20 AM EDT ----- Can we send in Crestor 10 mg #90 with 1 refill for her? Also can schedule follow up with PCP.   Hi Sharalyn,   Your cholesterol has improved but isn't quite controlled. I would recommend increasing your crestor to 10 mg nightly. I will send you in a new refill. I would recommend following up in 6 months with your PCP.   Best, Carles Collet, PA-C

## 2020-05-14 NOTE — Telephone Encounter (Signed)
Patient called, left VM to return the call for results.

## 2020-05-14 NOTE — Telephone Encounter (Signed)
Left message to call back for results

## 2020-05-18 MED ORDER — ROSUVASTATIN CALCIUM 10 MG PO TABS: 10.0000 mg | ORAL_TABLET | Freq: Every day | ORAL | 3 refills | Status: DC

## 2020-05-18 NOTE — Addendum Note (Signed)
Addended by: Casimer Leek C on: 05/18/2020 11:06 AM   Modules accepted: Orders

## 2020-05-18 NOTE — Telephone Encounter (Signed)
Patient was advised and states that she started doubling her Crestor 5 MG. She states she will call back to schedule a follow-up appointment with PCP. Medication send into pharmacy as well.

## 2020-08-13 ENCOUNTER — Other Ambulatory Visit: Payer: Self-pay | Admitting: Urgent Care

## 2020-08-13 ENCOUNTER — Telehealth: Payer: Self-pay | Admitting: Urgent Care

## 2020-08-13 MED ORDER — NAPROXEN 500 MG PO TABS: 500.0000 mg | ORAL_TABLET | Freq: Two times a day (BID) | ORAL | 0 refills | Status: DC

## 2020-08-13 NOTE — Telephone Encounter (Signed)
Patient is having significant dental pain.  She just had a dental extraction.  Denies history of kidney disease, heart issues.  Recommended naproxen for pain and inflammation.

## 2020-10-07 ENCOUNTER — Other Ambulatory Visit: Payer: Self-pay | Admitting: Family Medicine

## 2020-10-07 DIAGNOSIS — K2100 Gastro-esophageal reflux disease with esophagitis, without bleeding: Secondary | ICD-10-CM

## 2020-11-13 ENCOUNTER — Ambulatory Visit: Payer: Self-pay | Admitting: *Deleted

## 2020-11-13 ENCOUNTER — Encounter: Payer: Self-pay | Admitting: Emergency Medicine

## 2020-11-13 ENCOUNTER — Observation Stay
Admission: EM | Admit: 2020-11-13 | Discharge: 2020-11-13 | Disposition: A | Discharge status: Home / Self Care | Payer: 59 | Attending: Internal Medicine | Admitting: Internal Medicine

## 2020-11-13 ENCOUNTER — Telehealth: Payer: Self-pay | Admitting: *Deleted

## 2020-11-13 ENCOUNTER — Ambulatory Visit: Payer: Self-pay | Admitting: Physician Assistant

## 2020-11-13 ENCOUNTER — Encounter: Payer: Self-pay | Admitting: Family Medicine

## 2020-11-13 ENCOUNTER — Other Ambulatory Visit: Payer: Self-pay

## 2020-11-13 ENCOUNTER — Observation Stay: Payer: 59

## 2020-11-13 ENCOUNTER — Inpatient Hospital Stay (HOSPITAL_COMMUNITY)
Admission: AD | Admit: 2020-11-13 | Discharge: 2020-11-15 | DRG: 378 | Disposition: A | Discharge status: Home / Self Care | Payer: 59 | Source: Other Acute Inpatient Hospital | Attending: Internal Medicine | Admitting: Internal Medicine

## 2020-11-13 DIAGNOSIS — K5731 Diverticulosis of large intestine without perforation or abscess with bleeding: Principal | ICD-10-CM

## 2020-11-13 DIAGNOSIS — D62 Acute posthemorrhagic anemia: Secondary | ICD-10-CM

## 2020-11-13 DIAGNOSIS — Z6836 Body mass index (BMI) 36.0-36.9, adult: Secondary | ICD-10-CM

## 2020-11-13 DIAGNOSIS — F32A Depression, unspecified: Secondary | ICD-10-CM | POA: Diagnosis present

## 2020-11-13 DIAGNOSIS — K922 Gastrointestinal hemorrhage, unspecified: Secondary | ICD-10-CM | POA: Diagnosis present

## 2020-11-13 DIAGNOSIS — K219 Gastro-esophageal reflux disease without esophagitis: Secondary | ICD-10-CM | POA: Diagnosis present

## 2020-11-13 DIAGNOSIS — E785 Hyperlipidemia, unspecified: Secondary | ICD-10-CM | POA: Diagnosis present

## 2020-11-13 DIAGNOSIS — E7800 Pure hypercholesterolemia, unspecified: Secondary | ICD-10-CM | POA: Diagnosis not present

## 2020-11-13 DIAGNOSIS — K5790 Diverticulosis of intestine, part unspecified, without perforation or abscess without bleeding: Secondary | ICD-10-CM

## 2020-11-13 DIAGNOSIS — Z20822 Contact with and (suspected) exposure to covid-19: Secondary | ICD-10-CM | POA: Insufficient documentation

## 2020-11-13 DIAGNOSIS — Z7982 Long term (current) use of aspirin: Secondary | ICD-10-CM | POA: Diagnosis not present

## 2020-11-13 DIAGNOSIS — Z8249 Family history of ischemic heart disease and other diseases of the circulatory system: Secondary | ICD-10-CM | POA: Diagnosis not present

## 2020-11-13 DIAGNOSIS — F17210 Nicotine dependence, cigarettes, uncomplicated: Secondary | ICD-10-CM | POA: Diagnosis not present

## 2020-11-13 DIAGNOSIS — K625 Hemorrhage of anus and rectum: Secondary | ICD-10-CM | POA: Diagnosis not present

## 2020-11-13 DIAGNOSIS — F331 Major depressive disorder, recurrent, moderate: Secondary | ICD-10-CM | POA: Diagnosis not present

## 2020-11-13 DIAGNOSIS — E6609 Other obesity due to excess calories: Secondary | ICD-10-CM

## 2020-11-13 HISTORY — DX: Gastrointestinal hemorrhage, unspecified: K92.2

## 2020-11-13 LAB — CBC
HCT: 36.4 % (ref 36.0–46.0)
Hemoglobin: 10.9 g/dL — ABNORMAL LOW (ref 12.0–15.0)
MCH: 26.4 pg (ref 26.0–34.0)
MCHC: 29.9 g/dL — ABNORMAL LOW (ref 30.0–36.0)
MCV: 88.1 fL (ref 80.0–100.0)
Platelets: 344 10*3/uL (ref 150–400)
RBC: 4.13 MIL/uL (ref 3.87–5.11)
RDW: 14.7 % (ref 11.5–15.5)
WBC: 5.8 10*3/uL (ref 4.0–10.5)
nRBC: 0 % (ref 0.0–0.2)

## 2020-11-13 LAB — PROTIME-INR
INR: 0.9 (ref 0.8–1.2)
Prothrombin Time: 12.4 seconds (ref 11.4–15.2)

## 2020-11-13 LAB — COMPREHENSIVE METABOLIC PANEL
ALT: 25 U/L (ref 0–44)
AST: 29 U/L (ref 15–41)
Albumin: 3.7 g/dL (ref 3.5–5.0)
Alkaline Phosphatase: 77 U/L (ref 38–126)
Anion gap: 9 (ref 5–15)
BUN: 10 mg/dL (ref 8–23)
CO2: 21 mmol/L — ABNORMAL LOW (ref 22–32)
Calcium: 8.3 mg/dL — ABNORMAL LOW (ref 8.9–10.3)
Chloride: 105 mmol/L (ref 98–111)
Creatinine, Ser: 0.9 mg/dL (ref 0.44–1.00)
GFR, Estimated: >60 mL/min (ref >60)
Glucose, Bld: 105 mg/dL — ABNORMAL HIGH (ref 70–99)
Potassium: 4.4 mmol/L (ref 3.5–5.1)
Sodium: 135 mmol/L (ref 135–145)
Total Bilirubin: 0.7 mg/dL (ref 0.3–1.2)
Total Protein: 7 g/dL (ref 6.5–8.1)

## 2020-11-13 LAB — GLUCOSE, CAPILLARY
Glucose-Capillary: 144 mg/dL — ABNORMAL HIGH (ref 70–99)
Glucose-Capillary: 157 mg/dL — ABNORMAL HIGH (ref 70–99)

## 2020-11-13 LAB — APTT: aPTT: 26 seconds (ref 24–36)

## 2020-11-13 LAB — MRSA PCR SCREENING: MRSA by PCR: NEGATIVE

## 2020-11-13 LAB — GLUCOSE, RANDOM: Glucose, Bld: 159 mg/dL — ABNORMAL HIGH (ref 70–99)

## 2020-11-13 LAB — PREPARE RBC (CROSSMATCH)

## 2020-11-13 LAB — SARS CORONAVIRUS 2 (TAT 6-24 HRS): SARS Coronavirus 2: NEGATIVE

## 2020-11-13 LAB — ABO/RH: ABO/RH(D): A POS

## 2020-11-13 LAB — HEMOGLOBIN: Hemoglobin: 9 g/dL — ABNORMAL LOW (ref 12.0–15.0)

## 2020-11-13 LAB — TROPONIN I (HIGH SENSITIVITY): Troponin I (High Sensitivity): 3 ng/L (ref <18)

## 2020-11-13 MED ORDER — PANTOPRAZOLE SODIUM 40 MG PO TBEC: 40.0000 mg | DELAYED_RELEASE_TABLET | Freq: Every day | ORAL | Status: DC

## 2020-11-13 MED ORDER — SODIUM CHLORIDE 0.9 % IV SOLN: INTRAVENOUS | Status: DC

## 2020-11-13 MED ORDER — ROSUVASTATIN CALCIUM 10 MG PO TABS: 10.0000 mg | ORAL_TABLET | Freq: Every day | ORAL | Status: DC

## 2020-11-13 MED ORDER — ACETAMINOPHEN 325 MG PO TABS: 325.0000 mg | ORAL_TABLET | Freq: Four times a day (QID) | ORAL | Status: DC | PRN

## 2020-11-13 MED ORDER — SODIUM CHLORIDE 0.9% IV SOLUTION: Freq: Once | INTRAVENOUS | Status: DC

## 2020-11-13 MED ORDER — VITAMIN D 25 MCG (1000 UNIT) PO TABS: 1000.0000 [IU] | ORAL_TABLET | Freq: Every day | ORAL | Status: DC

## 2020-11-13 MED ORDER — ACETAMINOPHEN 650 MG RE SUPP: 325.0000 mg | Freq: Four times a day (QID) | RECTAL | Status: DC | PRN

## 2020-11-13 MED ORDER — MELATONIN 5 MG PO TABS
10.0000 mg | ORAL_TABLET | Freq: Every evening | ORAL | Status: DC | PRN
  Filled 2020-11-13: qty 2

## 2020-11-13 MED ORDER — ONDANSETRON HCL 4 MG PO TABS: 4.0000 mg | ORAL_TABLET | Freq: Four times a day (QID) | ORAL | Status: DC | PRN

## 2020-11-13 MED ORDER — ONDANSETRON HCL 4 MG/2ML IJ SOLN: 4.0000 mg | Freq: Four times a day (QID) | INTRAMUSCULAR | Status: DC | PRN

## 2020-11-13 MED ORDER — TECHNETIUM TC 99M-LABELED RED BLOOD CELLS IV KIT
20.0000 | PACK | Freq: Once | INTRAVENOUS | Status: AC | PRN
  Administered 2020-11-13: 21.03 via INTRAVENOUS

## 2020-11-13 MED ORDER — VITAMIN D 25 MCG (1000 UNIT) PO TABS
1000.0000 [IU] | ORAL_TABLET | Freq: Every day | ORAL | Status: DC
  Filled 2020-11-13: qty 1

## 2020-11-13 MED ORDER — FLUOXETINE HCL 20 MG PO CAPS
40.0000 mg | ORAL_CAPSULE | Freq: Every day | ORAL | Status: DC
  Filled 2020-11-13: qty 2

## 2020-11-13 MED ORDER — ALBUTEROL SULFATE HFA 108 (90 BASE) MCG/ACT IN AERS
2.0000 | INHALATION_SPRAY | RESPIRATORY_TRACT | Status: DC | PRN
  Filled 2020-11-13: qty 6.7

## 2020-11-13 NOTE — ED Provider Notes (Signed)
[facility] Emergency Department Provider Note   ____________________________________________    I have reviewed the triage vital signs and the nursing notes.   HISTORY  Chief Complaint Rectal Bleeding     HPI Elisama Thissen is a 65 y.o. female with a history of GERD who presents with complaints of rectal bleeding.  Patient reports multiple bloody bowel movements this morning.  She reports it is entirely blood and no stool, and the blood was red, no melena reported.  No history of the same.  Not on blood thinners.  Not taking NSAIDs.  No history of diverticulosis.  No abdominal pain.  Feels slightly fatigued   Past Medical History:  Diagnosis Date  . Depression 2006  . GERD (gastroesophageal reflux disease) 2006    Patient Active Problem List   Diagnosis Date Noted  . Class 1 obesity without serious comorbidity with body mass index (BMI) of 33.0 to 33.9 in adult 03/22/2019  . Psoriasis 03/22/2019  . Anxiety 04/06/2018  . Hyperlipidemia 04/06/2018  . Tobacco use disorder 01/10/2018  . GERD (gastroesophageal reflux disease) 01/10/2018  . Allergic rhinitis 08/21/2015  . Depression 02/19/2014    Past Surgical History:  Procedure Laterality Date  . TONSILLECTOMY Bilateral Childhood  . TUBAL LIGATION    . WRIST FRACTURE SURGERY Right    in childhood    Prior to Admission medications   Medication Sig Start Date End Date Taking? Authorizing Provider  albuterol (VENTOLIN HFA) 108 (90 Base) MCG/ACT inhaler Inhale 2 puffs into the lungs every 4 (four) hours as needed for wheezing or shortness of breath. 04/27/20   Sharion Balloon, NP  amoxicillin-clavulanate (AUGMENTIN) 875-125 MG tablet Take 1 tablet by mouth every 12 (twelve) hours. Patient not taking: Reported on 05/12/2020 04/27/20   Sharion Balloon, NP  aspirin 81 MG tablet Take 81 mg by mouth daily.    [provider]  benzonatate (TESSALON) 100 MG capsule Take 1 capsule (100 mg total) by  mouth 3 (three) times daily as needed for cough. 04/27/20   Sharion Balloon, NP  BIOTIN PO Take by mouth. Patient not taking: Reported on 05/12/2020    [provider]  Chlorphen-Phenyleph-ASA (ALKA-SELTZER PLUS COLD PO) Take by mouth.    [provider]  cholecalciferol (VITAMIN D) 1000 UNITS tablet Take 1,000 Units by mouth daily.    [provider]  FLUoxetine (PROZAC) 20 MG capsule Take 2 capsules (40 mg total) by mouth daily. 03/25/20   Trinna Post, PA-C  fluticasone (FLONASE) 50 MCG/ACT nasal spray Place 2 sprays into both nostrils daily. 03/25/20   Trinna Post, PA-C  magic mouthwash w/lidocaine SOLN Take 5 mLs by mouth 3 (three) times daily as needed for mouth pain. 03/27/20   Trinna Post, PA-C  MELATONIN PO Take 1 tablet by mouth daily. Reported on 08/21/2015    [provider]  naproxen (NAPROSYN) 500 MG tablet Take 1 tablet (500 mg total) by mouth 2 (two) times daily with a meal. 08/13/20   Jaynee Eagles, PA-C  Omega-3 Fatty Acids (FISH OIL) 1000 MG CAPS Take by mouth.    [provider]  omeprazole (PRILOSEC) 20 MG capsule TAKE 1 CAPSULE BY MOUTH ONCE A DAY 10/07/20   Bacigalupo, Dionne Bucy, MD  predniSONE (STERAPRED UNI-PAK 21 TAB) 10 MG (21) TBPK tablet Take by mouth daily. As directed Patient not taking: Reported on 05/12/2020 04/27/20   Sharion Balloon, NP  Pseudoeph-Doxylamine-DM-APAP (NYQUIL PO) Take by mouth.  Patient not taking: Reported on 05/12/2020    [provider]  rosuvastatin (CRESTOR) 10 MG tablet Take 1 tablet (10 mg total) by mouth daily. 05/18/20   Trinna Post, PA-C  triamcinolone ointment (KENALOG) 0.5 % Apply 1 application topically 2 (two) times daily. 03/22/19   Virginia Crews, MD     Allergies Atorvastatin  Family History  Problem Relation Age of Onset  . Hypertension Mother   . Diverticulosis Mother   . Healthy Father   . Hypertension Sister   . Healthy Sister   . Healthy Brother   . Healthy  Sister   . Alcohol abuse Brother   . Alcohol abuse Brother   . Cirrhosis Brother   . Cirrhosis Brother   . Colon cancer Neg Hx   . Breast cancer Neg Hx   . Ovarian cancer Neg Hx   . Cervical cancer Neg Hx     Social History Social History   Tobacco Use  . Smoking status: Current Every Day Smoker    Packs/day: 0.30    Years: 42.00    Pack years: 12.60    Types: Cigarettes  . Smokeless tobacco: Never Used  . Tobacco comment: started smoking at 65 YO; is smoking 1/3 PPD  Vaping Use  . Vaping Use: Never used  Substance Use Topics  . Alcohol use: No    Alcohol/week: 0.0 standard drinks  . Drug use: No    Review of Systems  Constitutional: No fever/chills Eyes: No visual changes.  ENT: No sore throat. Cardiovascular: Denies chest pain.  No palpitation Respiratory: Denies shortness of breath. Gastrointestinal: No abdominal pain, no nausea or vomiting Genitourinary: Negative for dysuria. Musculoskeletal: Negative for back pain. Skin: Negative for rash. Neurological: Negative for headaches   ____________________________________________   PHYSICAL EXAM:  VITAL SIGNS: ED Triage Vitals  Enc Vitals Group     BP 11/13/20 1003 (!) 139/95     Pulse Rate 11/13/20 1003 (!) 105     Resp 11/13/20 1003 16     Temp 11/13/20 1003 98.6 F (37 C)     Temp Source 11/13/20 1003 Oral     SpO2 11/13/20 1003 100 %     Weight 11/13/20 1003 102.5 kg (226 lb)     Height 11/13/20 1003 1.676 m (5\' 6" )     Head Circumference --      Peak Flow --      Pain Score 11/13/20 1006 0     Pain Loc --      Pain Edu? --      Excl. in [place]? --     Constitutional: Alert and oriented.   Nose: No congestion/rhinnorhea. Mouth/Throat: Mucous membranes are moist.   Neck:  Painless ROM Cardiovascular: Mild tachycardia, regular rhythm. Grossly normal heart sounds.  Good peripheral circulation. Respiratory: Normal respiratory effort.  No retractions. Lungs CTAB. Gastrointestinal: Soft and  nontender. No distention.  No CVA tenderness.  No tenderness  Musculoskeletal: No lower extremity tenderness nor edema.  Warm and well perfused Neurologic:  Normal speech and language. No gross focal neurologic deficits are appreciated.  Skin:  Skin is warm, dry and intact. No rash noted. Psychiatric: Mood and affect are normal. Speech and behavior are normal.  ____________________________________________   LABS (all labs ordered are listed, but only abnormal results are displayed)  Labs Reviewed  COMPREHENSIVE METABOLIC PANEL - Abnormal; Notable for the following components:      Result Value   CO2 21 (*)    Glucose,  Bld 105 (*)    Calcium 8.3 (*)    All other components within normal limits  CBC - Abnormal; Notable for the following components:   Hemoglobin 10.9 (*)    MCHC 29.9 (*)    All other components within normal limits  SARS CORONAVIRUS 2 (TAT 6-24 HRS)  APTT  PROTIME-INR  TYPE AND SCREEN   ____________________________________________  EKG  None ____________________________________________  RADIOLOGY  None ____________________________________________   PROCEDURES  Procedure(s) performed: No  Procedures   Critical Care performed: No ____________________________________________   INITIAL IMPRESSION / ASSESSMENT AND PLAN / ED COURSE  Pertinent labs & imaging results that were available during my care of the patient were reviewed by me and considered in my medical decision making (see chart for details).  Patient presents with painless rectal bleeding as noted above.  Suspicious for diverticular bleed, no blood thinners, no recent NSAID usage  Mildly tachycardic upon arrival but normal blood pressure.  We will place IV, obtain labs to evaluate hemoglobin  Hemoglobin is 10.9, down approximately 2 g from last hemoglobin measured  Otherwise lab work is reassuring.  Suspicious for diverticular bleed, will admit to the hospitalist service     ____________________________________________   FINAL CLINICAL IMPRESSION(S) / ED DIAGNOSES  Final diagnoses:  Lower GI bleed        Note:  This document was prepared using Dragon voice recognition software and may include unintentional dictation errors.   Lavonia Drafts, MD 11/13/20 1309

## 2020-11-13 NOTE — ED Notes (Signed)
Pt taken to [facility] at this time.

## 2020-11-13 NOTE — ED Triage Notes (Signed)
Patient to ER for c/o bright red rectal bleeding that started this am.

## 2020-11-13 NOTE — Progress Notes (Signed)
Pt arrived from floor at approx 1900, report given to charge RN at bedside. Charge RN gave report to me at bedside, provider at bedside. Pt VSS. 1L of NS given, 1 unit PRBC ordered and hung. Pt A&Ox 4,VSS on RA , no c/o pain. 2 new PIVs placed. Pt picked up by transport approx 2150 for transport to [place]. Pt sister at bedside and updated with transition. Transfer of care complete.

## 2020-11-13 NOTE — ED Notes (Signed)
Spoke to [facility] who stated they would be coming to get pt shortly.

## 2020-11-13 NOTE — Progress Notes (Deleted)
      Established patient visit   Patient: Deborah Jenkins   DOB: [birth year]   65 y.o. Female  MRN: [id number] Visit Date: 11/13/2020  Today's healthcare provider: Trinna Post, PA-C   No chief complaint on file.  Subjective    HPI  Hemorrhoids: Patient complains of {exam; rfv anorectal:14296}. Onset of symptoms was {onset:60104} ago with {clinical course - history:17::"unchanged"} course since that time.  She describes symptoms as {anorectal symptoms:14157}. Treatment to date has been {anorectal tx to T[facility]:65035}. Patient denies {anorectal denies:14159}.   {Show patient history (optional):23778::" "}   Medications: Outpatient Medications Prior to Visit  Medication Sig  . albuterol (VENTOLIN HFA) 108 (90 Base) MCG/ACT inhaler Inhale 2 puffs into the lungs every 4 (four) hours as needed for wheezing or shortness of breath.  Marland Kitchen amoxicillin-clavulanate (AUGMENTIN) 875-125 MG tablet Take 1 tablet by mouth every 12 (twelve) hours. (Patient not taking: Reported on 05/12/2020)  . aspirin 81 MG tablet Take 81 mg by mouth daily.  . benzonatate (TESSALON) 100 MG capsule Take 1 capsule (100 mg total) by mouth 3 (three) times daily as needed for cough.  Marland Kitchen BIOTIN PO Take by mouth. (Patient not taking: Reported on 05/12/2020)  . Chlorphen-Phenyleph-ASA (ALKA-SELTZER PLUS COLD PO) Take by mouth.  . cholecalciferol (VITAMIN D) 1000 UNITS tablet Take 1,000 Units by mouth daily.  Marland Kitchen FLUoxetine (PROZAC) 20 MG capsule Take 2 capsules (40 mg total) by mouth daily.  . fluticasone (FLONASE) 50 MCG/ACT nasal spray Place 2 sprays into both nostrils daily.  . magic mouthwash w/lidocaine SOLN Take 5 mLs by mouth 3 (three) times daily as needed for mouth pain.  Marland Kitchen MELATONIN PO Take 1 tablet by mouth daily. Reported on 08/21/2015  . naproxen (NAPROSYN) 500 MG tablet Take 1 tablet (500 mg total) by mouth 2 (two) times daily with a meal.  . Omega-3 Fatty Acids (FISH OIL) 1000 MG CAPS Take by mouth.  Marland Kitchen omeprazole  (PRILOSEC) 20 MG capsule TAKE 1 CAPSULE BY MOUTH ONCE A DAY  . predniSONE (STERAPRED UNI-PAK 21 TAB) 10 MG (21) TBPK tablet Take by mouth daily. As directed (Patient not taking: Reported on 05/12/2020)  . Pseudoeph-Doxylamine-DM-APAP (NYQUIL PO) Take by mouth. (Patient not taking: Reported on 05/12/2020)  . rosuvastatin (CRESTOR) 10 MG tablet Take 1 tablet (10 mg total) by mouth daily.  Marland Kitchen triamcinolone ointment (KENALOG) 0.5 % Apply 1 application topically 2 (two) times daily.   No facility-administered medications prior to visit.    Review of Systems  {Labs  Heme  Chem  Endocrine  Serology  Results Review (optional):23779::" "}   Objective    There were no vitals taken for this visit. {Show previous vital signs (optional):23777::" "}   Physical Exam  ***  No results found for any visits on 11/13/20.  Assessment & Plan     ***  No follow-ups on file.      {provider attestation***:1}   Paulene Floor  [facility] [phone] (phone) [phone] (fax)  [place]

## 2020-11-13 NOTE — Telephone Encounter (Signed)
Pt called with complaints of rectal bleeding that started 11/13/20; she had a BM x 3 and she is not sure if there was blood in the first 2 BMs; she says there was a large amount of bright red blood on the tissue and in the commode; recommendations made per nurse triage protocol; she verbalized understanding; the pt sees Dr Brita Romp, [facility]; will route to office for notification.  Reason for Disposition . MODERATE rectal bleeding (small blood clots, passing blood without stool, or toilet water turns red) . [1] MODERATE rectal bleeding (small blood clots, passing blood without stool, or toilet water turns red) AND [2] more than once a day  Answer Assessment - Initial Assessment Questions 1. APPEARANCE of BLOOD: "What color is it?" "Is it passed separately, on the surface of the stool, or mixed in with the stool?"      Bright red on tissue and in commode 2. AMOUNT: "How much blood was passed?"      Unable to quantify 3. FREQUENCY: "How many times has blood been passed with the stools?"      At least once; pt had 3 BMs today 4. ONSET: "When was the blood first seen in the stools?" (Days or weeks)      11/13/20 5. DIARRHEA: "Is there also some diarrhea?" If Yes, ask: "How many diarrhea stools were passed in past 24 hours?"      no 6. CONSTIPATION: "Do you have constipation?" If Yes, ask: "How bad is it?"    no 7. RECURRENT SYMPTOMS: "Have you had blood in your stools before?" If Yes, ask: "When was the last time?" and "What happened that time?"     no 8. BLOOD THINNERS: "Do you take any blood thinners?" (e.g., Coumadin/warfarin, Pradaxa/dabigatran, aspirin)     Aspirin 81 mg daily 9. OTHER SYMPTOMS: "Do you have any other symptoms?"  (e.g., abdominal pain, vomiting, dizziness, fever)   Urgency to have BM 10. PREGNANCY: "Is there any chance you are pregnant?" "When was your last menstrual period?"     no  Protocols used: RECTAL BLEEDING-A-AH

## 2020-11-13 NOTE — Progress Notes (Addendum)
Report given to primary RN Joy at [facility] via phone. No questions or concerns at the moment, transport at bedside. Pt A&O, VSS, no concerns. 1 unit PRBC hung, running,  Handoff given to Transport RN blood to continue infusing primary transfer RN care.

## 2020-11-13 NOTE — Progress Notes (Signed)
Pt arrived to floor from ER at 6:35, sitting up in bed, giving me her history and explaining why she was here. Within 10 minutes, she said she had to have a BM so I pulled up the BSC to the bed knowing she was weak and she had a medium dark bloody liquid stool. She got herself back to bed no problem and we continued talking. 5 minutes later, she said she had to go again and bolted for the commode which thankfully was right there. She started having another liquid stool with lots of gas and said, "Ooo, I'm feeling weak." I immediately ran to her side and stood in front of her in case she were to pass out and told her to take some deep breaths. Within 30 seconds she had passed out and I called a rapid response on the patient. BP was 125/78, O2 was 98% on room air and respirations were 28. The whole team arrived within 3 minutes or less.

## 2020-11-13 NOTE — H&P (Signed)
History and Physical   Deborah Jenkins AOZ:[id number] DOB: Dec 22, 1955 DOA: 11/13/2020  PCP: Erasmo Downer, MD  Patient coming from: home  I have personally briefly reviewed patient's old medical records in [facility] EMR.  Chief Concern: Bright red blood per rectum  HPI: Deborah Jenkins is a 65 y.o. female with medical history significant for hyperlipidemia, obesity, presents to the emergency department for chief concerns of bright red blood per rectum.  She reports the rectal bleeding started 11/13/20. She had four episodes of rectal bleeding and with the last episode she felt weakness. She denies syncope or loc. She reports this has never happened before.   She states her last colonoscopy and was normal and was told she did not have diverticulosis or hemorrhoids. She takes naproxen or ibuprofen 200 mg infrequently, maybe once a month for headaches. She is not menstruating, she had menopause at 46.  She denies shortness of breath, chest pain, dysuria, hematuria, vaginal bleeding, overt abdominal pain.  She feels abdominal discomfort/irritation and feels like she is about to have another bowel movement. She denies eating beets or inserting anything in her rectum.  Allergies: She states atorvastatin made her hair fall out and she tolerates crestor and does not take this every day.  Social history: lives by herself. She smokes less than 1 pack per week. She never drinks etoh and denies recreational drug use. She works as a Engineer, site.  Vaccination: Patient is vaccinated for COVID-19, 2 doses  ROS: Constitutional: no weight change, no fever ENT/Mouth: no sore throat, no rhinorrhea Eyes: no eye pain, no vision changes Cardiovascular: no chest pain, no dyspnea,  no edema, no palpitations Respiratory: no cough, no sputum, no wheezing Gastrointestinal: no nausea, no vomiting, + diarrhea, no constipation Genitourinary: no urinary incontinence, no dysuria, no hematuria.  Abdominal  discomfort Musculoskeletal: no arthralgias, no myalgias Skin: no skin lesions, no pruritus, Neuro: + weakness, no loss of consciousness, no syncope Psych: no anxiety, no depression, + decrease appetite Heme/Lymph: no bruising, + rectal bleeding/bloody diarrhea  ED Course: Discussed with ED provider, patient requiring hospitalization due to bright red blood per rectum with significant hemoglobin drop.  Vitals in the ED was reassuring with temperature of 98, respiration rate of 16, heart rate of 80, initially tachycardic with rate of 105, blood pressure 118/83, satting at 98% on room air.  Assessment/Plan  Active Problems:   Lower GI bleed   Lower GI bleed Moderate acute anemia secondary to lower GI loss -Patient is hemodynamically stable at this time -Hemoglobin on presentation is 10.9 baseline is 12.2-12.9 -Colonoscopy at age 40 was negative for diverticular or internal hemorrhoids per patient report -GI has been consulted via secure chat -CBC in the a.m. -As patient is hemodynamically stable at this time no urgent need for blood transfusion  Hyperlipidemia-Crestor 10 mg nightly has been resumed GERD-PPI resumed  As needed medications: Albuterol, acetaminophen, ondansetron  Chart reviewed.   DVT prophylaxis: TED hose, no pharmacologic DVT prophylaxis due to high risk of bleeding Code Status: Full code Diet: Clear liquids, n.p.o. at midnight Family Communication: No Disposition Plan: Pending clinical course Consults called: Gastroenterology Admission status: MedSurg observation with telemetry  Past Medical History:  Diagnosis Date  . Depression 2006  . GERD (gastroesophageal reflux disease) 2006   Past Surgical History:  Procedure Laterality Date  . TONSILLECTOMY Bilateral Childhood  . TUBAL LIGATION    . WRIST FRACTURE SURGERY Right    in childhood   Social History:  reports that she  has been smoking cigarettes. She has a 12.60 pack-year smoking history. She has  never used smokeless tobacco. She reports that she does not drink alcohol and does not use drugs.  Allergies  Allergen Reactions  . Atorvastatin     Hair loss   Family History  Problem Relation Age of Onset  . Hypertension Mother   . Diverticulosis Mother   . Healthy Father   . Hypertension Sister   . Healthy Sister   . Healthy Brother   . Healthy Sister   . Alcohol abuse Brother   . Alcohol abuse Brother   . Cirrhosis Brother   . Cirrhosis Brother   . Colon cancer Neg Hx   . Breast cancer Neg Hx   . Ovarian cancer Neg Hx   . Cervical cancer Neg Hx    Family history: Family history reviewed and not pertinent  Prior to Admission medications   Medication Sig Start Date End Date Taking? Authorizing Provider  albuterol (VENTOLIN HFA) 108 (90 Base) MCG/ACT inhaler Inhale 2 puffs into the lungs every 4 (four) hours as needed for wheezing or shortness of breath. 04/27/20   Mickie Bail, NP  amoxicillin-clavulanate (AUGMENTIN) 875-125 MG tablet Take 1 tablet by mouth every 12 (twelve) hours. Patient not taking: Reported on 05/12/2020 04/27/20   Mickie Bail, NP  aspirin 81 MG tablet Take 81 mg by mouth daily.    [provider]  benzonatate (TESSALON) 100 MG capsule Take 1 capsule (100 mg total) by mouth 3 (three) times daily as needed for cough. 04/27/20   Mickie Bail, NP  BIOTIN PO Take by mouth. Patient not taking: Reported on 05/12/2020    [provider]  Chlorphen-Phenyleph-ASA (ALKA-SELTZER PLUS COLD PO) Take by mouth.    [provider]  cholecalciferol (VITAMIN D) 1000 UNITS tablet Take 1,000 Units by mouth daily.    [provider]  FLUoxetine (PROZAC) 20 MG capsule Take 2 capsules (40 mg total) by mouth daily. 03/25/20   Trey Sailors, PA-C  fluticasone (FLONASE) 50 MCG/ACT nasal spray Place 2 sprays into both nostrils daily. 03/25/20   Trey Sailors, PA-C  magic mouthwash w/lidocaine SOLN Take 5 mLs by mouth 3 (three) times daily as  needed for mouth pain. 03/27/20   Trey Sailors, PA-C  MELATONIN PO Take 1 tablet by mouth daily. Reported on 08/21/2015    [provider]  naproxen (NAPROSYN) 500 MG tablet Take 1 tablet (500 mg total) by mouth 2 (two) times daily with a meal. 08/13/20   Wallis Bamberg, PA-C  Omega-3 Fatty Acids (FISH OIL) 1000 MG CAPS Take by mouth.    [provider]  omeprazole (PRILOSEC) 20 MG capsule TAKE 1 CAPSULE BY MOUTH ONCE A DAY 10/07/20   Bacigalupo, Marzella Schlein, MD  predniSONE (STERAPRED UNI-PAK 21 TAB) 10 MG (21) TBPK tablet Take by mouth daily. As directed Patient not taking: Reported on 05/12/2020 04/27/20   Mickie Bail, NP  Pseudoeph-Doxylamine-DM-APAP (NYQUIL PO) Take by mouth. Patient not taking: Reported on 05/12/2020    [provider]  rosuvastatin (CRESTOR) 10 MG tablet Take 1 tablet (10 mg total) by mouth daily. 05/18/20   Trey Sailors, PA-C  triamcinolone ointment (KENALOG) 0.5 % Apply 1 application topically 2 (two) times daily. 03/22/19   Erasmo Downer, MD   Physical Exam: Vitals:   11/13/20 1003 11/13/20 1158 11/13/20 1159  BP: (!) 139/95 118/83 118/83  Pulse: (!) 105 79 80  Resp: 16  14 16  Temp: 98.6 F (37 C) 98 F (36.7 C) 98 F (36.7 C)  TempSrc: Oral Oral Oral  SpO2: 100%  98%  Weight: 102.5 kg    Height: 5\' 6"  (1.676 m)     Constitutional: appears younger than chronological age, NAD, calm, comfortable Eyes: PERRL, lids and conjunctivae normal ENMT: Mucous membranes are moist. Posterior pharynx clear of any exudate or lesions. Age-appropriate dentition. Hearing appropriate Neck: normal, supple, no masses, no thyromegaly Respiratory: clear to auscultation bilaterally, no wheezing, no crackles. Normal respiratory effort. No accessory muscle use.  Cardiovascular: Regular rate and rhythm, no murmurs / rubs / gallops. No extremity edema. 2+ pedal pulses. No carotid bruits.  Abdomen: Obese abdomen, moderate tenderness diffusely throughout the  abdomen, no tenderness, no masses palpated, no hepatosplenomegaly. Bowel sounds positive.  Musculoskeletal: no clubbing / cyanosis. No joint deformity upper and lower extremities. Good ROM, no contractures, no atrophy. Normal muscle tone.  Skin: no rashes, lesions, ulcers. No induration Neurologic: Sensation intact. Strength 5/5 in all 4.  Psychiatric: Normal judgment and insight. Alert and oriented x 3. Normal mood.   EKG: independently reviewed, showing sinus rhythm with rate of 90, QTc 423  Chest x-ray on Admission: Not indicated  Labs on Admission: I have personally reviewed following labs  CBC: Recent Labs  Lab 11/13/20 1022  WBC 5.8  HGB 10.9*  HCT 36.4  MCV 88.1  PLT 344   Basic Metabolic Panel: Recent Labs  Lab 11/13/20 1022  NA 135  K 4.4  CL 105  CO2 21*  GLUCOSE 105*  BUN 10  CREATININE 0.90  CALCIUM 8.3*   GFR: Estimated Creatinine Clearance: 78.4 mL/min (by C-G formula based on SCr of 0.9 mg/dL).  Liver Function Tests: Recent Labs  Lab 11/13/20 1022  AST 29  ALT 25  ALKPHOS 77  BILITOT 0.7  PROT 7.0  ALBUMIN 3.7   Coagulation Profile: Recent Labs  Lab 11/13/20 1022  INR 0.9   Urine analysis:    Component Value Date/Time   BILIRUBINUR neg 05/18/2016 1209   PROTEINUR neg 05/18/2016 1209   UROBILINOGEN 0.2 05/18/2016 1209   NITRITE neg 05/18/2016 1209   LEUKOCYTESUR Trace (A) 05/18/2016 1209   Theodosia Bahena N Julie-Ann Vanmaanen D.O. [facility]  If 7PM-7AM, please contact overnight-coverage provider If 7AM-7PM, please contact day coverage provider [email or web address]  11/13/2020, 1:28 PM

## 2020-11-13 NOTE — Progress Notes (Signed)
Chaplain responded to Rapid Response.  Chaplain is available to provide support and follow-up.    11/13/20 1900  Clinical Encounter Type  Visited With Patient  Visit Type Code

## 2020-11-13 NOTE — Progress Notes (Signed)
Report give not transport via phone Caleb,RN 2050. ETA 20-30 minutes for patient. Pt A&Ox 4, RA, all VSS, 2 patent PIVS, pt denies any c/o pain, sob, light headness. Plan to hang 1 unit PRBC and give updated handoff once transport arrives to pick patient up. Daughter at bedside. No questions at the moment.

## 2020-11-13 NOTE — Consult Note (Signed)
Cephas Darby, MD [address]  [place]  [place], [place] [address]  Main: [phone]  Fax: [phone] Pager: [phone]   Consultation  Referring Provider:     No ref. provider found Primary Care Physician:  Virginia Crews, MD Primary Gastroenterologist: Althia Forts         Reason for Consultation:     Rectal bleeding  Date of Admission:  11/13/2020 Date of Consultation:  11/13/2020         HPI:   Deborah Jenkins is a 65 y.o. female with no significant past medical history presents to the ER this morning due to recurrent episodes of hematochezia, she reports that she had 2 episodes last night and she did not pay attention to her bowel movement but she knew her stool was liquid.  She had third episode and found that it was just blood.  She had fourth episode when she was at work this morning just before she arrived in the ER which was dark red with clots.  When I saw her, patient felt like she about to have another bowel movement, she feels gurgling in her abdomen.  She feels weak otherwise denies any dizziness, lightheadedness.  No prior episodes in the past.  Had a colonoscopy in 2015, was found to have sigmoid diverticulosis.  Patient denies constipation.  She does smoke cigarettes, 1 pack lasts for about a week.  She is otherwise healthy  Hemoglobin on admission was 10.9, last normal 12.2 in 03/2020.  Normal BUN/creatinine.  Normal LFTs, normal coags She does not drink alcohol  NSAIDs: None  Antiplts/Anticoagulants/Anti thrombotics: None  GI Procedures: Colonoscopy in 2015 for screening Excellent prep, sigmoid diverticulosis  Past Medical History:  Diagnosis Date  . Depression 2006  . GERD (gastroesophageal reflux disease) 2006    Past Surgical History:  Procedure Laterality Date  . TONSILLECTOMY Bilateral Childhood  . TUBAL LIGATION    . WRIST FRACTURE SURGERY Right    in childhood    Prior to Admission medications   Medication Sig Start Date End  Date Taking? Authorizing Provider  albuterol (VENTOLIN HFA) 108 (90 Base) MCG/ACT inhaler Inhale 2 puffs into the lungs every 4 (four) hours as needed for wheezing or shortness of breath. 04/27/20  Yes Sharion Balloon, NP  aspirin 81 MG tablet Take 81 mg by mouth daily.   Yes [provider]  cholecalciferol (VITAMIN D) 1000 UNITS tablet Take 1,000 Units by mouth daily.   Yes [provider]  cholecalciferol (VITAMIN D3) 25 MCG (1000 UNIT) tablet Take 1,000 Units by mouth daily.   Yes [provider]  FLUoxetine (PROZAC) 20 MG capsule Take 2 capsules (40 mg total) by mouth daily. Patient taking differently: Take 40 mg by mouth at bedtime. 03/25/20  Yes Trinna Post, PA-C  Melatonin 10 MG TABS Take 10 mg by mouth at bedtime.   Yes [provider]  Omega-3 Fatty Acids (FISH OIL) 1000 MG CAPS Take 1,000 mg by mouth daily.   Yes [provider]  omeprazole (PRILOSEC) 20 MG capsule TAKE 1 CAPSULE BY MOUTH ONCE A DAY 10/07/20  Yes Bacigalupo, Dionne Bucy, MD  rosuvastatin (CRESTOR) 10 MG tablet Take 1 tablet (10 mg total) by mouth daily. 05/18/20   Trinna Post, PA-C   Current Facility-Administered Medications:  .  acetaminophen (TYLENOL) tablet 325 mg, 325 mg, Oral, Q6H PRN **OR** acetaminophen (TYLENOL) suppository 325 mg, 325 mg, Rectal, Q6H PRN, Cox, Amy N, DO .  albuterol (VENTOLIN HFA) 108 (90 Base) MCG/ACT inhaler 2 puff, 2 puff, Inhalation, Q4H PRN, Cox, Amy N, DO .  cholecalciferol (VITAMIN D3) tablet 1,000 Units, 1,000 Units, Oral, Daily, Cox, Amy N, DO .  FLUoxetine (PROZAC) capsule 40 mg, 40 mg, Oral, QHS, Cox, Amy N, DO .  melatonin tablet 10 mg, 10 mg, Oral, QHS PRN, Cox, Amy N, DO .  ondansetron (ZOFRAN) tablet 4 mg, 4 mg, Oral, Q6H PRN **OR** ondansetron (ZOFRAN) injection 4 mg, 4 mg, Intravenous, Q6H PRN, Cox, Amy N, DO .  [START ON 11/14/2020] pantoprazole (PROTONIX) EC tablet 40 mg, 40 mg, Oral, Daily, Cox, Amy N, DO .  [START ON 11/14/2020]  rosuvastatin (CRESTOR) tablet 10 mg, 10 mg, Oral, QHS, Cox, Amy N, DO  Current Outpatient Medications:  .  albuterol (VENTOLIN HFA) 108 (90 Base) MCG/ACT inhaler, Inhale 2 puffs into the lungs every 4 (four) hours as needed for wheezing or shortness of breath., Disp: 18 g, Rfl: 0 .  aspirin 81 MG tablet, Take 81 mg by mouth daily., Disp: , Rfl:  .  cholecalciferol (VITAMIN D) 1000 UNITS tablet, Take 1,000 Units by mouth daily., Disp: , Rfl:  .  cholecalciferol (VITAMIN D3) 25 MCG (1000 UNIT) tablet, Take 1,000 Units by mouth daily., Disp: , Rfl:  .  FLUoxetine (PROZAC) 20 MG capsule, Take 2 capsules (40 mg total) by mouth daily. (Patient taking differently: Take 40 mg by mouth at bedtime.), Disp: 180 capsule, Rfl: 3 .  Melatonin 10 MG TABS, Take 10 mg by mouth at bedtime., Disp: , Rfl:  .  Omega-3 Fatty Acids (FISH OIL) 1000 MG CAPS, Take 1,000 mg by mouth daily., Disp: , Rfl:  .  omeprazole (PRILOSEC) 20 MG capsule, TAKE 1 CAPSULE BY MOUTH ONCE A DAY, Disp: 90 capsule, Rfl: 1 .  rosuvastatin (CRESTOR) 10 MG tablet, Take 1 tablet (10 mg total) by mouth daily., Disp: 90 tablet, Rfl: 3   Family History  Problem Relation Age of Onset  . Hypertension Mother   . Diverticulosis Mother   . Healthy Father   . Hypertension Sister   . Healthy Sister   . Healthy Brother   . Healthy Sister   . Alcohol abuse Brother   . Alcohol abuse Brother   . Cirrhosis Brother   . Cirrhosis Brother   . Colon cancer Neg Hx   . Breast cancer Neg Hx   . Ovarian cancer Neg Hx   . Cervical cancer Neg Hx      Social History   Tobacco Use  . Smoking status: Current Every Day Smoker    Packs/day: 0.30    Years: 42.00    Pack years: 12.60    Types: Cigarettes  . Smokeless tobacco: Never Used  . Tobacco comment: started smoking at 65 YO; is smoking 1/3 PPD  Vaping Use  . Vaping Use: Never used  Substance Use Topics  . Alcohol use: No    Alcohol/week: 0.0 standard drinks  . Drug use: No    Allergies as  of 11/13/2020 - Review Complete 11/13/2020  Allergen Reaction Noted  . Atorvastatin  04/06/2018    Review of Systems:    All systems reviewed and negative except where noted in HPI.   Physical Exam:  Vital signs in last 24 hours: Temp:  [98 F (36.7 C)-98.6 F (37 C)] 98 F (36.7 C) (03/11 1159) Pulse Rate:  [79-105] 80 (03/11 1159) Resp:  [14-16] 16 (03/11 1159) BP: (118-139)/(83-95) 118/83 (03/11 1159) SpO2:  [  98 %-100 %] 98 % (03/11 1159) Weight:  [102.5 kg] 102.5 kg (03/11 1003)   General:   Pleasant, cooperative in NAD Head:  Normocephalic and atraumatic. Eyes:   No icterus.   Conjunctiva pink. PERRLA. Ears:  Normal auditory acuity. Neck:  Supple; no masses or thyroidomegaly Lungs: Respirations even and unlabored. Lungs clear to auscultation bilaterally.   No wheezes, crackles, or rhonchi.  Heart:  Regular rate and rhythm;  Without murmur, clicks, rubs or gallops Abdomen:  Soft, nondistended, nontender. Normal bowel sounds. No appreciable masses or hepatomegaly.  No rebound or guarding.  Rectal:  Not performed. Msk:  Symmetrical without gross deformities.  Strength normal Extremities:  Without edema, cyanosis or clubbing. Neurologic:  Alert and oriented x3;  grossly normal neurologically. Skin:  Intact without significant lesions or rashes. Psych:  Alert and cooperative. Normal affect.  LAB RESULTS: CBC Latest Ref Rng & Units 11/13/2020 03/25/2020 03/22/2019  WBC 4.0 - 10.5 K/uL 5.8 5.6 6.2  Hemoglobin 12.0 - 15.0 g/dL 10.9(L) 12.2 12.9  Hematocrit 36.0 - 46.0 % 36.4 38.5 41.5  Platelets 150 - 400 K/uL 344 356 383    BMET BMP Latest Ref Rng & Units 11/13/2020 03/25/2020 03/22/2019  Glucose 70 - 99 mg/dL 105(H) 95 89  BUN 8 - 23 mg/dL _0 Creatinine 0.44 - 1.00 mg/dL 0.90 0.85 0.89  BUN/Creat Ratio 12 - 28 - 11(L) 10(L)  Sodium 135 - 145 mmol/L 135 139 140  Potassium 3.5 - 5.1 mmol/L 4.4 4.7 4.7  Chloride 98 - 111 mmol/L 105 104 103  CO2 22 - 32 mmol/L 21(L) 21  20  Calcium 8.9 - 10.3 mg/dL 8.3(L) 9.1 9.6    LFT Hepatic Function Latest Ref Rng & Units 11/13/2020 03/25/2020 03/22/2019  Total Protein 6.5 - 8.1 g/dL 7.0 7.1 7.0  Albumin 3.5 - 5.0 g/dL 3.7 4.1 4.6  AST 15 - 41 U/L _1 ALT 0 - 44 U/L _2 Alk Phosphatase 38 - 126 U/L 77 83 81  Total Bilirubin 0.3 - 1.2 mg/dL 0.7 0.2 0.3     STUDIES: No results found.    Impression / Plan:   Deborah Jenkins is a 65 y.o. female with painless rectal bleeding/hematochezia.  Most likely diverticular bleed.  Normal BUN/creatinine  Hematochezia, likely diverticular bleed Recommend stat bleeding scan, if positive, consult vascular surgery or IR for embolization.  If bleeding scan is negative, bowel prep tonight and colonoscopy tomorrow Okay with clear liquid diet Maintenance IV fluids Monitor CBC closely, transfuse to keep hemoglobin above 7  Thank you for involving me in the care of this patient.  Dr. Vicente Males will cover for the weekend    LOS: 0 days   Sherri Sear, MD  11/13/2020, 2:45 PM   Note: This dictation was prepared with Dragon dictation along with smaller phrase technology. Any transcriptional errors that result from this process are unintentional.

## 2020-11-13 NOTE — Progress Notes (Signed)
Rapid Response Event Note   Reason for Call : called for RR on actively bleeding GIB patient who was just admitted to floor.   Initial Focused Assessment: laying in bed, lethargic, but arousable and answers questions. Notable amount bright red blood from rectum.      Interventions: Amy Cox, DO at bedside... emergent transfer to stepdown. Stat labs, Amp dextrose, and NS bolus given.    Plan of Care: Dr Tobie Poet speaking with GI and IR on call to formulate plan at this time.    Event Summary: as above  MD Notified: Dr Posey Rea Call Time:1855 Arrival Time:1857 End Time:1930  Richell Corker A, RN

## 2020-11-13 NOTE — Telephone Encounter (Signed)
Returned call to patient and scheduled ov. Advised ED if symptoms worsen.

## 2020-11-13 NOTE — Discharge Summary (Addendum)
Physician Discharge Summary   Baneza Bartoszek  female DOB: [birth year]  JQB:[id number]  PCP: Virginia Crews, MD  Admit date: 11/13/2020 Discharge date: 11/13/2020  Admitted From: [facility] Disposition: [facility], higher level of care Home Health: No CODE STATUS: Full code  Discharge Instructions    Increase activity slowly   Complete by: As directed      Hospital Course:  For full details, please see H&P, progress notes, consult notes and ancillary notes.   Briefly, Ms. Caserta is a 65 year old female with medical history significant for hyperlipidemia, obesity, depression, presented to the [facility] emergency department for chief concerns of bright red blood per rectum.    She reports the rectal bleeding started on 11/13/2020. She had 4 episodes of rectal bleeding and with the last episode she felt mild weakness.  She denies syncope, loss of consciousness, inability to ambulate, chest pain, shortness of breath, overt abdominal pain.  She reports this is never happened before.  She states her last colonoscopy, at age 38, was normal and she said specifically that she did not have diverticular disease or hemorrhoids.  She was told to come back in 5 years because of family history of diverticulosis.  However, at age 30 she did not go back for the colonoscopy.  During her initial hospital admission evaluation she denies shortness of breath, chest pain, dysuria, hematuria, vaginal bleeding, overt abdominal pain, chest pain, dizziness, weakness.  Of note, she denies taking BC powder, Goody powder, drinking alcohol.  She takes naproxen or ibuprofen 200 mg infrequently, once a month for headaches.  She states the last time she took an ibuprofen was about a month ago.  She is currently not menstruating and she reached menopause at approximately age 24.  GI was consulted and recommended nuclear medicine GI blood loss scan which was done and read as GI bleed localized to the transverse colon  and splenic flexure.  Upon arrival to medical floor, she endorsed to nursing staff that she felt very weak and per nursing staff: 'she promptly passed'.  It is unclear whether she actually lost consciousness. RN communicated this change in status to me via secure chat and I immediately ordered repeat: STAT vitals, EKG, troponin, glucose, hemoglobin were ordered.  1 unit of packed red blood cell was ordered stat.  Rapid response was called and I arrived to the patient's bedroom and she was in Trendelenburg position.  She was awake alert and oriented to self, age, location.  She endorsed feeling very weak and had spontaneous eye opening.  Please see my rapid response progress note for further details.  GI was recalled, IR was called, vascular was called and all specialist services recommended transfer to [facility] for higher level of care, concerns for impending hemodynamic compromise and need for emergent IR intervention, which is not available at [facility] at this time.  [facility], [facility] has accepted the patient.  Prior to transfer, patient remained stable, endorsed to me that she understands that she will be transferred. I updated patient on the plan.  I updated sister, Karlene Einstein at bedside appropriately.  All patient and family questions were answered and all concerns were addressed.  The patient's chronic medical conditions were treated accordingly per the patient's home medication regimen.  Discharge Diagnoses:  Active Problems:   Lower GI bleed  Discharge Instructions:   Allergies as of 11/13/2020      Reactions   Atorvastatin    Hair loss  Medication List    STOP taking these medications   aspirin 81 MG tablet     TAKE these medications   albuterol 108 (90 Base) MCG/ACT inhaler Commonly known as: VENTOLIN HFA Inhale 2 puffs into the lungs every 4 (four) hours as needed for wheezing or shortness of breath.   cholecalciferol 1000 units tablet Commonly known  as: VITAMIN D Take 1,000 Units by mouth daily.   cholecalciferol 25 MCG (1000 UNIT) tablet Commonly known as: VITAMIN D3 Take 1,000 Units by mouth daily.   Fish Oil 1000 MG Caps Take 1,000 mg by mouth daily.   FLUoxetine 20 MG capsule Commonly known as: PROZAC Take 2 capsules (40 mg total) by mouth daily. What changed: when to take this   Melatonin 10 MG Tabs Take 10 mg by mouth at bedtime.   omeprazole 20 MG capsule Commonly known as: PRILOSEC TAKE 1 CAPSULE BY MOUTH ONCE A DAY   rosuvastatin 10 MG tablet Commonly known as: Crestor Take 1 tablet (10 mg total) by mouth daily.       Allergies  Allergen Reactions  . Atorvastatin     Hair loss   The results of significant diagnostics from this hospitalization (including imaging, microbiology, ancillary and laboratory) are listed below for reference.   Consultations: Gastroenterology, interventional radiology  Procedures/Studies: NM GI Blood Loss  Result Date: 11/13/2020 CLINICAL DATA:  Bright red blood per rectum. EXAM: NUCLEAR MEDICINE GASTROINTESTINAL BLEEDING SCAN TECHNIQUE: Sequential abdominal images were obtained following intravenous administration of Tc-56m labeled red blood cells. RADIOPHARMACEUTICALS:  21.03 mCi Tc-38m pertechnetate in-vitro labeled red cells. COMPARISON:  None FINDINGS: Activity is noted in the region of the transverse colon and splenic flexure which does not show significant movement. Findings consistent with a primary colonic bleed. IMPRESSION: GI bleed localized to the transverse colon and splenic flexure. Electronically Signed   By: Marijo Sanes M.D.   On: 11/13/2020 17:14   Labs: I personally reviewed the following labs  Basic Metabolic Panel: Recent Labs  Lab 11/13/20 1022 11/13/20 1912  NA 135  --   K 4.4  --   CL 105  --   CO2 21*  --   GLUCOSE 105* 159*  BUN 10  --   CREATININE 0.90  --   CALCIUM 8.3*  --    Liver Function Tests: Recent Labs  Lab 11/13/20 1022  AST 29   ALT 25  ALKPHOS 77  BILITOT 0.7  PROT 7.0  ALBUMIN 3.7   CBC: Recent Labs  Lab 11/13/20 1022 11/13/20 1912  WBC 5.8  --   HGB 10.9* 9.0*  HCT 36.4  --   MCV 88.1  --   PLT 344  --    CBG: Recent Labs  Lab 11/13/20 1859 11/13/20 1916  GLUCAP 157* 144*   Urinalysis    Component Value Date/Time   BILIRUBINUR neg 05/18/2016 1209   PROTEINUR neg 05/18/2016 1209   UROBILINOGEN 0.2 05/18/2016 1209   NITRITE neg 05/18/2016 1209   LEUKOCYTESUR Trace (A) 05/18/2016 1209   Microbiology Recent Results (from the past 240 hour(s))  SARS CORONAVIRUS 2 (TAT 6-24 HRS) Nasopharyngeal Nasopharyngeal Swab     Status: None   Collection Time: 11/13/20 11:23 AM   Specimen: Nasopharyngeal Swab  Result Value Ref Range Status   SARS Coronavirus 2 NEGATIVE NEGATIVE Final    Comment: (NOTE) SARS-CoV-2 target nucleic acids are NOT DETECTED.  The SARS-CoV-2 RNA is generally detectable in upper and lower respiratory specimens during the acute phase  of infection. Negative results do not preclude SARS-CoV-2 infection, do not rule out co-infections with other pathogens, and should not be used as the sole basis for treatment or other patient management decisions. Negative results must be combined with clinical observations, patient history, and epidemiological information. The expected result is Negative.  Fact Sheet for Patients: SugarRoll.be  Fact Sheet for Healthcare Providers: https://www.woods-mathews.com/  This test is not yet approved or cleared by the [place] FDA and  has been authorized for detection and/or diagnosis of SARS-CoV-2 by FDA under an Emergency Use Authorization (EUA). This EUA will remain  in effect (meaning this test can be used) for the duration of the COVID-19 declaration under Se ction 564(b)(1) of the Act, 21 U.S.C. section 360bbb-3(b)(1), unless the authorization is terminated or revoked sooner.  Performed at  [facility], [place] [address], [place], [place] [address]   MRSA PCR Screening     Status: None   Collection Time: 11/13/20  7:25 PM   Specimen: Nasopharyngeal  Result Value Ref Range Status   MRSA by PCR NEGATIVE NEGATIVE Final    Comment:        The GeneXpert MRSA Assay (FDA approved for NASAL specimens only), is one component of a comprehensive MRSA colonization surveillance program. It is not intended to diagnose MRSA infection nor to guide or monitor treatment for MRSA infections. Performed at [facility], [address] [place],  [address]    Total time spend on discharging this patient, including the last patient exam, discussing the hospital stay, instructions for ongoing care as it relates to all pertinent caregivers, as well as preparing the medical discharge records, prescriptions, and/or referrals as applicable, is 30 minutes.  Criss Alvine, MD  [facility] 11/13/2020, 10:15 PM

## 2020-11-13 NOTE — Telephone Encounter (Signed)
Patient sent an email stating she had a bloody stool this morning. Patient states there was a lot of blood in the toilet. Patient states bleeding occurs only with stools. Patient states no dizziness. Patient was schedule to see Fabio Bering this morning. Patient was advised ED if bleeding worsens. Patient expressed understanding.

## 2020-11-13 NOTE — ED Notes (Signed)
Son at bedside.

## 2020-11-13 NOTE — Progress Notes (Signed)
[facility] Rapid Response Note  I received secure message from RN: She passed out, bleeding from the rectum  I ordered a stat hemoglobin hematocrit, blood glucose, troponin, repeat vital signs, EKG  Presented at bedside to patient  Vitals: Respiration was noted to be 16, SBP 110, MAP 91, HR 83, temperature was 99.2, satting 100% on room air.  Physical exam:  Neuro: Patient was opening eyes spontaneously and weakly talking and responding to sternal rubs.  She was answering questions and stating that she feels very weak. Skin appeared pale. Normocephalic atraumatic, trachea midline, no asymmetry noted, pupils equal reactive to light and accommodation, eyes spontaneously opening, spontaneously breathing, not using accessory muscles, capillary refills less than 3 seconds, heart rate regular, normal rate.  Abdomen soft nontender, nondistended.  Moving all extremities.  GU: Bright red blood per rectum and on the bed sheets, unable to quantify the volume.  Assessment and Plan:   Symptomatic anemia in setting of active bleeding from transverse colon and splenic flexure per nuclear medicine GI blood loss can -1 unit of packed red blood cell was ordered for transfusion immediately. -D50 stat -I discussed the case with GI on-call, IR on-call, and vascular on-call and recommended transfer patient to [facility] -I called [facility] patient to [facility] for IR intervention.  [facility] has accepted the patient pending bed.  Family communication: Per patient request, I updated updated sister, Karlene Einstein.  Ona Rathert, DO [facility]   CRITICAL CARE Performed by: Criss Alvine  Total critical care time: 40 minutes  Critical care time was exclusive of separately billable procedures and treating other patients.  Critical care was necessary to treat or prevent imminent or life-threatening deterioration.  Critical care was time spent personally by me on the following  activities: development of treatment plan with patient and/or surrogate as well as nursing, discussions with consultants, evaluation of patient's response to treatment, examination of patient, obtaining history from patient or surrogate, ordering and performing treatments and interventions, ordering and review of laboratory studies, ordering and review of radiographic studies, pulse oximetry and re-evaluation of patient's condition.

## 2020-11-14 DIAGNOSIS — K5790 Diverticulosis of intestine, part unspecified, without perforation or abscess without bleeding: Secondary | ICD-10-CM

## 2020-11-14 DIAGNOSIS — E7800 Pure hypercholesterolemia, unspecified: Secondary | ICD-10-CM

## 2020-11-14 DIAGNOSIS — D62 Acute posthemorrhagic anemia: Secondary | ICD-10-CM

## 2020-11-14 DIAGNOSIS — K219 Gastro-esophageal reflux disease without esophagitis: Secondary | ICD-10-CM

## 2020-11-14 DIAGNOSIS — K5731 Diverticulosis of large intestine without perforation or abscess with bleeding: Secondary | ICD-10-CM | POA: Diagnosis not present

## 2020-11-14 DIAGNOSIS — F331 Major depressive disorder, recurrent, moderate: Secondary | ICD-10-CM

## 2020-11-14 DIAGNOSIS — K922 Gastrointestinal hemorrhage, unspecified: Secondary | ICD-10-CM | POA: Diagnosis not present

## 2020-11-14 LAB — TYPE AND SCREEN
ABO/RH(D): A POS
ABO/RH(D): A POS
Antibody Screen: NEGATIVE
Antibody Screen: NEGATIVE
Unit division: 0

## 2020-11-14 LAB — HEMOGLOBIN AND HEMATOCRIT, BLOOD
HCT: 30.8 % — ABNORMAL LOW (ref 36.0–46.0)
HCT: 29.4 % — ABNORMAL LOW (ref 36.0–46.0)
Hemoglobin: 9.7 g/dL — ABNORMAL LOW (ref 12.0–15.0)
Hemoglobin: 9.5 g/dL — ABNORMAL LOW (ref 12.0–15.0)

## 2020-11-14 LAB — BASIC METABOLIC PANEL
Anion gap: 6 (ref 5–15)
BUN: 6 mg/dL — ABNORMAL LOW (ref 8–23)
CO2: 23 mmol/L (ref 22–32)
Calcium: 8.4 mg/dL — ABNORMAL LOW (ref 8.9–10.3)
Chloride: 108 mmol/L (ref 98–111)
Creatinine, Ser: 0.83 mg/dL (ref 0.44–1.00)
GFR, Estimated: >60 mL/min (ref >60)
Glucose, Bld: 107 mg/dL — ABNORMAL HIGH (ref 70–99)
Potassium: 3.9 mmol/L (ref 3.5–5.1)
Sodium: 137 mmol/L (ref 135–145)

## 2020-11-14 LAB — BPAM RBC
Blood Product Expiration Date: 202204102359
ISSUE DATE / TIME: 202203112045
Unit Type and Rh: 6200

## 2020-11-14 MED ORDER — ACETAMINOPHEN 325 MG PO TABS: 650.0000 mg | ORAL_TABLET | Freq: Four times a day (QID) | ORAL | Status: DC | PRN

## 2020-11-14 MED ORDER — ROSUVASTATIN CALCIUM 5 MG PO TABS
10.0000 mg | ORAL_TABLET | Freq: Every day | ORAL | Status: DC
  Administered 2020-11-14: 10 mg via ORAL
  Filled 2020-11-14: qty 2

## 2020-11-14 MED ORDER — FLUTICASONE PROPIONATE 50 MCG/ACT NA SUSP
1.0000 | Freq: Every day | NASAL | Status: DC | PRN
  Filled 2020-11-14: qty 16

## 2020-11-14 MED ORDER — OMEGA-3-ACID ETHYL ESTERS 1 G PO CAPS
1.0000 g | ORAL_CAPSULE | Freq: Every day | ORAL | Status: DC
  Administered 2020-11-14: 1 g via ORAL
  Filled 2020-11-14: qty 1

## 2020-11-14 MED ORDER — ALBUTEROL SULFATE HFA 108 (90 BASE) MCG/ACT IN AERS
2.0000 | INHALATION_SPRAY | RESPIRATORY_TRACT | Status: DC | PRN
  Filled 2020-11-14: qty 6.7

## 2020-11-14 MED ORDER — DEXTROSE IN LACTATED RINGERS 5 % IV SOLN: INTRAVENOUS | Status: DC

## 2020-11-14 MED ORDER — FLUOXETINE HCL 20 MG PO CAPS
20.0000 mg | ORAL_CAPSULE | Freq: Every day | ORAL | Status: DC
  Administered 2020-11-14: 20 mg via ORAL
  Filled 2020-11-14: qty 1

## 2020-11-14 MED ORDER — MELATONIN 5 MG PO TABS
10.0000 mg | ORAL_TABLET | Freq: Every day | ORAL | Status: DC
  Administered 2020-11-14: 10 mg via ORAL
  Filled 2020-11-14: qty 2

## 2020-11-14 MED ORDER — ONDANSETRON HCL 4 MG/2ML IJ SOLN: 4.0000 mg | Freq: Four times a day (QID) | INTRAMUSCULAR | Status: DC | PRN

## 2020-11-14 MED ORDER — ACETAMINOPHEN 650 MG RE SUPP: 650.0000 mg | Freq: Four times a day (QID) | RECTAL | Status: DC | PRN

## 2020-11-14 MED ORDER — PANTOPRAZOLE SODIUM 40 MG PO TBEC
40.0000 mg | DELAYED_RELEASE_TABLET | Freq: Every day | ORAL | Status: DC
  Administered 2020-11-14: 40 mg via ORAL
  Filled 2020-11-14: qty 1

## 2020-11-14 MED ORDER — VITAMIN D 25 MCG (1000 UNIT) PO TABS
1000.0000 [IU] | ORAL_TABLET | Freq: Every day | ORAL | Status: DC
  Administered 2020-11-14: 1000 [IU] via ORAL
  Filled 2020-11-14: qty 1

## 2020-11-14 MED ORDER — SODIUM CHLORIDE 0.9 % IV SOLN: INTRAVENOUS | Status: DC

## 2020-11-14 NOTE — Progress Notes (Signed)
Pt mentioned she has not been taking her Crestor since last 1 month. She thought it caused her to loose her hair. Nor did she followed up on her lab work. Told pt that she could talk to MD if she wants to switch to different cholesterol medication. Pt stated she will take the same medication and will see . Will continue to monitor.

## 2020-11-14 NOTE — Consult Note (Addendum)
Referring Provider:  [facility]         Primary Care Physician:  Virginia Crews, MD Primary Gastroenterologist:  unassigned           We were asked to see this patient for:   GI bleed               Attending physician's note   I have taken a history, examined the patient and reviewed the chart. I agree with the Advanced Practitioner's note, impression and recommendations.  65 year old very pleasant female transferred from [facility] with hematochezia.  RBC tagged scan was positive for bleeding near splenic flexure  Colonoscopy in 2015 showed left colon diverticulosis otherwise was unremarkable with no polyps.. She is current with colorectal cancer screening, is not due for colonoscopy.  [place] etiology of hematochezia is diverticular hemorrhage  She is currently hemodynamically stable no further bleeding  Continue clear liquids Monitor hemoglobin and transfuse if below 7  If she develops recurrent episode of hematochezia, please page IR for CT angio and embolization  Please call with any questions   The patient was provided an opportunity to ask questions and all were answered. The patient agreed with the plan and demonstrated an understanding of the instructions.  Damaris Hippo , MD [phone]   ASSESSMENT / PLAN:   # 65 yo female with painless hematochezia and RBC tagged scan positive for bleeding in area of transverse colon and splenic flexure. Transferred to [facility] for possible IR intervention but IR evaluated and recommended supportive care. Bleeding stopped last night. Suspect diverticular hemorrhage --If no further bleeding today then she can be discharged home. She is up to date on screening colonoscopy ( 2015).  --Continue clear liquids today.   # Acute blood loss anemia. Hgb stable at 9.5 post 1 unit of PRBCs.     HPI:                                                                                                                              Chief Complaint:  Rectal bleeding  Deborah Jenkins is a 65 y.o. female with a history of GERD, hyperlipidemia, and diverticulosis transferred from [facility] last night for painless hematochezia.  She began having bleeding yesterday, had multiple episodes with clots. She went to [facility] ED and had several more episodes. In ED she was slightly tachycardic, normotensive. Chemistry panel unremarkable. CBC notable for hemoglobin of 10.9, down from 12.2 in July. Covid screening test was negative.   Nuclear medicine scan that localized bleeding to transverse colon and splenic flexure. Gastroenterology at [facility] recommended transfer to [facility]. IR at [facility] recommended continued medical management unless life-threatening bleeding ensues.   This am patient reports that the bleeding stopped last evening at [facility]. She has never had this happen before. BMs always normal at home. She has GERD, asymptomatic on daily PPI for years. No other GI complaints. She is up to date  on colon cancer screening. No [place] of colon cancer.    PREVIOUS ENDOSCOPIC EVALUATIONS / PERTINENT STUDIES   Sept 2015 colonoscopy  --complete exam, excellent prep --lipoma in ascending colon, sigmoid diverticulosis.    Past Medical History:  Diagnosis Date   Depression 2006   GERD (gastroesophageal reflux disease) 2006    Past Surgical History:  Procedure Laterality Date   TONSILLECTOMY Bilateral Childhood   TUBAL LIGATION     WRIST FRACTURE SURGERY Right    in childhood    Prior to Admission medications   Medication Sig Start Date End Date Taking? Authorizing Provider  albuterol (VENTOLIN HFA) 108 (90 Base) MCG/ACT inhaler Inhale 2 puffs into the lungs every 4 (four) hours as needed for wheezing or shortness of breath. 04/27/20   Sharion Balloon, NP  cholecalciferol (VITAMIN D) 1000 UNITS tablet Take 1,000 Units by mouth daily.    [provider]  cholecalciferol (VITAMIN D3) 25 MCG (1000 UNIT)  tablet Take 1,000 Units by mouth daily.    [provider]  FLUoxetine (PROZAC) 20 MG capsule Take 2 capsules (40 mg total) by mouth daily. Patient taking differently: Take 40 mg by mouth at bedtime. 03/25/20   Trinna Post, PA-C  Melatonin 10 MG TABS Take 10 mg by mouth at bedtime.    [provider]  Omega-3 Fatty Acids (FISH OIL) 1000 MG CAPS Take 1,000 mg by mouth daily.    [provider]  omeprazole (PRILOSEC) 20 MG capsule TAKE 1 CAPSULE BY MOUTH ONCE A DAY 10/07/20   Bacigalupo, Dionne Bucy, MD  rosuvastatin (CRESTOR) 10 MG tablet Take 1 tablet (10 mg total) by mouth daily. 05/18/20   Trinna Post, PA-C    Current Facility-Administered Medications  Medication Dose Route Frequency Provider Last Rate Last Admin   0.9 %  sodium chloride infusion   Intravenous Continuous Opyd, [name], MD 110 mL/hr at 11/14/20 0322 New Bag at 11/14/20 0322   acetaminophen (TYLENOL) tablet 650 mg  650 mg Oral Q6H PRN Opyd, [name], MD       Or   acetaminophen (TYLENOL) suppository 650 mg  650 mg Rectal Q6H PRN Opyd, [name], MD       albuterol (VENTOLIN HFA) 108 (90 Base) MCG/ACT inhaler 2 puff  2 puff Inhalation Q4H PRN Opyd, [name], MD       ondansetron (ZOFRAN) injection 4 mg  4 mg Intravenous Q6H PRN Opyd, [name], MD        Allergies as of 11/13/2020 - Review Complete 11/13/2020  Allergen Reaction Noted   Atorvastatin  04/06/2018    Family History  Problem Relation Age of Onset   Hypertension Mother    Diverticulosis Mother    Healthy Father    Hypertension Sister    Healthy Sister    Healthy Brother    Healthy Sister    Alcohol abuse Brother    Alcohol abuse Brother    Cirrhosis Brother    Cirrhosis Brother    Colon cancer Neg Hx    Breast cancer Neg Hx    Ovarian cancer Neg Hx    Cervical cancer Neg Hx     Social History   Socioeconomic History   Marital status: Single    Spouse name: Not on file   Number of  children: 2   Years of education: 14   Highest education level: Associate degree: academic program  Occupational History   Occupation: Ship broker: [employer]  Comment: [employer]  Tobacco Use   Smoking status: Current Every Day Smoker    Packs/day: 0.30    Years: 42.00    Pack years: 12.60    Types: Cigarettes   Smokeless tobacco: Never Used   Tobacco comment: started smoking at 65 YO; is smoking 1/3 PPD  Vaping Use   Vaping Use: Never used  Substance and Sexual Activity   Alcohol use: No    Alcohol/week: 0.0 standard drinks   Drug use: No   Sexual activity: Yes    Birth control/protection: Post-menopausal  Other Topics Concern   Not on file  Social History Narrative      Social Determinants of Health   Financial Resource Strain: Not on file  Food Insecurity: Not on file  Transportation Needs: Not on file  Physical Activity: Not on file  Stress: Not on file  Social Connections: Not on file  Intimate Partner Violence: Not on file    Review of Systems: All systems reviewed and negative except where noted in HPI.  OBJECTIVE:    Physical Exam: Vital signs in last 24 hours: Temp:  [97.9 F (36.6 C)-99.3 F (37.4 C)] 98.8 F (37.1 C) (03/12 0833) Pulse Rate:  [79-105] 86 (03/12 0833) Resp:  [12-30] 14 (03/12 0833) BP: (99-139)/(66-95) 105/76 (03/12 0833) SpO2:  [94 %-100 %] 95 % (03/12 0833) Weight:  [102.5 kg] 102.5 kg (03/11 1003) Last BM Date: 11/14/20 General:   Alert, pleasant female in NAD Psych:  Pleasant, cooperative. Normal mood and affect. Eyes:  Pupils equal, sclera clear, no icterus.   Conjunctiva pink. Ears:  Normal auditory acuity. Nose:  No deformity, discharge,  or lesions. Neck:  Supple; no masses Lungs:  Clear throughout to auscultation.   No wheezes, crackles, or rhonchi.  Heart:  Regular rate and rhythm; no lower extremity edema Abdomen:  Soft, non-distended, nontender, BS active, no palp mass   Rectal:   Deferred  Msk:  Symmetrical without gross deformities. . Neurologic:  Alert and  oriented x4;  grossly normal neurologically. Skin:  Intact without significant lesions or rashes.     Intake/Output from previous day: No intake/output data recorded. Intake/Output this shift: No intake/output data recorded.   Lab Results: Recent Labs    11/13/20 1022 11/13/20 1912 11/14/20 0151  WBC 5.8  --   --   HGB 10.9* 9.0* 9.7*  HCT 36.4  --  30.8*  PLT 344  --   --    BMET Recent Labs    11/13/20 1022 11/13/20 1912 11/14/20 0151  NA 135  --  137  K 4.4  --  3.9  CL 105  --  108  CO2 21*  --  23  GLUCOSE 105* 159* 107*  BUN 10  --  6*  CREATININE 0.90  --  0.83  CALCIUM 8.3*  --  8.4*   LFT Recent Labs    11/13/20 1022  PROT 7.0  ALBUMIN 3.7  AST 29  ALT 25  ALKPHOS 77  BILITOT 0.7   PT/INR Recent Labs    11/13/20 1022  LABPROT 12.4  INR 0.9   Hepatitis Panel No results for input(s): HEPBSAG, HCVAB, HEPAIGM, HEPBIGM in the last 72 hours.   . CBC Latest Ref Rng & Units 11/14/2020 11/13/2020 11/13/2020  WBC 4.0 - 10.5 K/uL - - 5.8  Hemoglobin 12.0 - 15.0 g/dL 9.7(L) 9.0(L) 10.9(L)  Hematocrit 36.0 - 46.0 % 30.8(L) - 36.4  Platelets 150 - 400 K/uL - - 344    .  CMP Latest Ref Rng & Units 11/14/2020 11/13/2020 11/13/2020  Glucose 70 - 99 mg/dL 107(H) 159(H) 105(H)  BUN 8 - 23 mg/dL 6(L) - 10  Creatinine 0.44 - 1.00 mg/dL 0.83 - 0.90  Sodium 135 - 145 mmol/L 137 - 135  Potassium 3.5 - 5.1 mmol/L 3.9 - 4.4  Chloride 98 - 111 mmol/L 108 - 105  CO2 22 - 32 mmol/L 23 - 21(L)  Calcium 8.9 - 10.3 mg/dL 8.4(L) - 8.3(L)  Total Protein 6.5 - 8.1 g/dL - - 7.0  Total Bilirubin 0.3 - 1.2 mg/dL - - 0.7  Alkaline Phos 38 - 126 U/L - - 77  AST 15 - 41 U/L - - 29  ALT 0 - 44 U/L - - 25   Studies/Results: NM GI Blood Loss  Result Date: 11/13/2020 CLINICAL DATA:  Bright red blood per rectum. EXAM: NUCLEAR MEDICINE GASTROINTESTINAL BLEEDING SCAN TECHNIQUE: Sequential  abdominal images were obtained following intravenous administration of Tc-28m labeled red blood cells. RADIOPHARMACEUTICALS:  21.03 mCi Tc-53m pertechnetate in-vitro labeled red cells. COMPARISON:  None FINDINGS: Activity is noted in the region of the transverse colon and splenic flexure which does not show significant movement. Findings consistent with a primary colonic bleed. IMPRESSION: GI bleed localized to the transverse colon and splenic flexure. Electronically Signed   By: Marijo Sanes M.D.   On: 11/13/2020 17:14    Principal Problem:   Acute lower GI bleeding    Tye Savoy, NP-C @  11/14/2020, 9:25 AM

## 2020-11-14 NOTE — Plan of Care (Signed)

## 2020-11-14 NOTE — Progress Notes (Signed)
Chief Complaint: Patient was seen in consultation today for lower GI bleed  Referring Physician(s): Dr. Mitzi Hansen  Supervising Physician: Jacqulynn Cadet  Patient Status: Deborah Jenkins - In-pt  History of Present Illness: Deborah Jenkins is a 65 y.o. female thata presented [facility] with lower GI bleed. Has been transferred to [facility] after positive NM RBC scan. IR was contacted overnight. Pt stable but possible need for angio/embo. PMHx, meds, labs, imaging, allergies reviewed. Feeling better since arrival to [facility]. No further blood BM per pt. Has never had GI bleed before, had colonoscopy several years ago, no issues to her knowledge. Hgb dropped to 9.0, has received 1 PRBC, up to 9.7   Past Medical History:  Diagnosis Date  . Depression 2006  . GERD (gastroesophageal reflux disease) 2006    Past Surgical History:  Procedure Laterality Date  . TONSILLECTOMY Bilateral Childhood  . TUBAL LIGATION    . WRIST FRACTURE SURGERY Right    in childhood    Allergies: Atorvastatin  Medications:  Current Facility-Administered Medications:  .  0.9 %  sodium chloride infusion, , Intravenous, Continuous, Opyd, [name], MD, Last Rate: 110 mL/hr at 11/14/20 0322, New Bag at 11/14/20 0322 .  acetaminophen (TYLENOL) tablet 650 mg, 650 mg, Oral, Q6H PRN **OR** acetaminophen (TYLENOL) suppository 650 mg, 650 mg, Rectal, Q6H PRN, Opyd, Timothy S, MD .  albuterol (VENTOLIN HFA) 108 (90 Base) MCG/ACT inhaler 2 puff, 2 puff, Inhalation, Q4H PRN, Opyd, Timothy S, MD .  ondansetron (ZOFRAN) injection 4 mg, 4 mg, Intravenous, Q6H PRN, Opyd, [name], MD    Family History  Problem Relation Age of Onset  . Hypertension Mother   . Diverticulosis Mother   . Healthy Father   . Hypertension Sister   . Healthy Sister   . Healthy Brother   . Healthy Sister   . Alcohol abuse Brother   . Alcohol abuse Brother   . Cirrhosis Brother   . Cirrhosis Brother   . Colon cancer Neg Hx   . Breast cancer Neg  Hx   . Ovarian cancer Neg Hx   . Cervical cancer Neg Hx     Social History   Socioeconomic History  . Marital status: Single    Spouse name: Not on file  . Number of children: 2  . Years of education: 83  . Highest education level: Associate degree: academic program  Occupational History  . 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 Smoking status: Current Every Day Smoker    Packs/day: 0.30    Years: 42.00    Pack years: 12.60    Types: Cigarettes  . Smokeless tobacco: Never Used  . Tobacco comment: started smoking at 65 YO; is smoking 1/3 PPD  Vaping Use  . Vaping Use: Never used  Substance and Sexual Activity  . Alcohol use: No    Alcohol/week: 0.0 standard drinks  . Drug use: No  . Sexual activity: Yes    Birth control/protection: Post-menopausal  Other Topics Concern  . Not on file  Social History Narrative      Social Determinants of Health   Financial Resource Strain: Not on file  Food Insecurity: Not on file  Transportation Needs: Not on file  Physical Activity: Not on file  Stress: Not on file  Social Connections: Not on file     Review of Systems: A 12 point ROS discussed and pertinent positives are indicated in the HPI above.  All other systems are negative.  Review of Systems  Vital Signs: BP 105/76 (BP Location: Left Arm)   Pulse 86   Temp 98.8 F (37.1 C) (Oral)   Resp 14   SpO2 95%   Physical Exam Constitutional:      General: She is not in acute distress.    Appearance: Normal appearance. She is not ill-appearing.  HENT:     Mouth/Throat:     Mouth: Mucous membranes are moist.     Pharynx: Oropharynx is clear.  Cardiovascular:     Rate and Rhythm: Normal rate and regular rhythm.     Pulses: Normal pulses.     Heart sounds: Normal heart sounds.  Pulmonary:     Effort: Pulmonary effort is normal. No respiratory distress.     Breath sounds: Normal breath sounds.  Abdominal:      General: Abdomen is flat. There is no distension.     Palpations: Abdomen is soft.     Tenderness: There is no abdominal tenderness.  Skin:    General: Skin is warm and dry.  Neurological:     General: No focal deficit present.     Mental Status: She is alert and oriented to person, place, and time.  Psychiatric:        Mood and Affect: Mood normal.        Thought Content: Thought content normal.        Judgment: Judgment normal.      Imaging: NM GI Blood Loss  Result Date: 11/13/2020 CLINICAL DATA:  Bright red blood per rectum. EXAM: NUCLEAR MEDICINE GASTROINTESTINAL BLEEDING SCAN TECHNIQUE: Sequential abdominal images were obtained following intravenous administration of Tc-52m labeled red blood cells. RADIOPHARMACEUTICALS:  21.03 mCi Tc-21m pertechnetate in-vitro labeled red cells. COMPARISON:  None FINDINGS: Activity is noted in the region of the transverse colon and splenic flexure which does not show significant movement. Findings consistent with a primary colonic bleed. IMPRESSION: GI bleed localized to the transverse colon and splenic flexure. Electronically Signed   By: Marijo Sanes M.D.   On: 11/13/2020 17:14    Labs:  CBC: Recent Labs    03/25/20 1052 11/13/20 1022 11/13/20 1912 11/14/20 0151 11/14/20 0915  WBC 5.6 5.8  --   --   --   HGB 12.2 10.9* 9.0* 9.7* 9.5*  HCT 38.5 36.4  --  30.8* 29.4*  PLT 356 344  --   --   --     COAGS: Recent Labs    11/13/20 1022  INR 0.9  APTT 26    BMP: Recent Labs    03/25/20 1052 11/13/20 1022 11/13/20 1912 11/14/20 0151  NA 139 135  --  137  K 4.7 4.4  --  3.9  CL 104 105  --  108  CO2 21 21*  --  23  GLUCOSE 95 105* 159* 107*  BUN 9 10  --  6*  CALCIUM 9.1 8.3*  --  8.4*  CREATININE 0.85 0.90  --  0.83  GFRNONAA 74 >60  --  >60  GFRAA 86  --   --   --     LIVER FUNCTION TESTS: Recent Labs    03/25/20 1052 11/13/20 1022  BILITOT 0.2 0.7  AST 18 29  ALT 16 25  ALKPHOS 83 77  PROT 7.1 7.0   ALBUMIN 4.1 3.7    TUMOR MARKERS: No results for input(s): AFPTM, CEA, CA199, CHROMGRNA in the last 8760 hours.  Assessment and Plan: Lower GI  bleed, NM RBC reviewed with Dr. Laurence Ferrari Currently hemodynamically stable. Hgb up to 9.7 post transfusion No further bloody BM per pt. Discussed role for angio/embo in the event of recurrent bleeding. .Risks and benefits of visceral angio with possible embolization were discussed with the patient including, but not limited to bleeding, infection, vascular injury or contrast induced renal failure.  This interventional procedure involves the use of X-rays and because of the nature of the planned procedure, it is possible that we will have prolonged use of X-ray fluoroscopy.  Potential radiation risks to you include (but are not limited to) the following: - A slightly elevated risk for cancer  several years later in life. This risk is typically less than 0.5% percent. This risk is low in comparison to the normal incidence of human cancer, which is 33% for women and 50% for men according to the [place]. - Radiation induced injury can include skin redness, resembling a rash, tissue breakdown / ulcers and hair loss (which can be temporary or permanent).   The likelihood of either of these occurring depends on the difficulty of the procedure and whether you are sensitive to radiation due to previous procedures, disease, or genetic conditions.   IF your procedure requires a prolonged use of radiation, you will be notified and given written instructions for further action.  It is your responsibility to monitor the irradiated area for the 2 weeks following the procedure and to notify your physician if you are concerned that you have suffered a radiation induced injury.    All of the patient's questions were answered, patient is agreeable if procedure necessary.  Consent signed and in chart.     Thank you for this interesting  consult.  I greatly enjoyed meeting Anahit Klumb and look forward to participating in their care.  A copy of this report was sent to the requesting provider on this date.  Electronically Signed: Ascencion Dike, PA-C 11/14/2020, 9:55 AM   I spent a total of 20 minutes in face to face in clinical consultation, greater than 50% of which was counseling/coordinating care for GI bleed

## 2020-11-14 NOTE — Progress Notes (Signed)
Patient arrived to [id number] via carelink. Vitals were WNL at this time. Pt oriented to room with call bell in reach, CHG bath given, and CCMD called. Delay on entry of information d/t pt information being transferred.  Will continue to monitor.

## 2020-11-14 NOTE — H&P (Signed)
History and Physical    Lan Entsminger VOJ:[id number] DOB: Oct 24, 1955 DOA: 11/13/2020  PCP: Virginia Crews, MD   Patient coming from: Home   Chief Complaint: Rectal bleeding   HPI: Deborah Jenkins is a 65 y.o. female with medical history significant for hyperlipidemia and depression who presented to the emergency department for evaluation of rectal bleeding.  Patient reports that she was in her usual state of health until the night of 11/12/2020 when she woke up to use the restroom and had some liquid stool but did not turn the lights on or see the stool; this happened twice overnight.  The following morning she felt that she was going to have recurrent episode of diarrhea and saw what she describes as a large volume of dark red blood and clots.  She denies any abdominal pain, nausea, or vomiting, and has never experienced this previously.  She denies any fevers, chills, cough, or chest pain.  She had a colonoscopy in 2015 at [facility] with few diverticula in the sigmoid colon.  [facility] ED and hospital course: Upon arrival to the ED, patient is found to be afebrile, saturating well on room air, heart rate 105, and stable blood pressure.  EKG features a sinus rhythm.  Chemistry panel unremarkable.  CBC notable for hemoglobin of 10.9, down from 12.2 in July.  Covid screening test was negative.  She was admitted to the hospitalist service and GI was consulted.  Patient had recurrent episodes of painless rectal bleeding after admission and had a transient loss of consciousness associated with one of them.  She was given IV fluids and 1 unit of RBC after this episode.  She had a nuclear medicine scan that localized bleeding to transverse colon and splenic flexure.  Gastroenterology at [facility] recommended transfer to [facility].  IR at [facility] recommended continued medical management unless life-threatening bleeding ensues.  Patient arrives at [facility] with normal heart rate and blood pressure and no acute  complaints.  Review of Systems:  All other systems reviewed and apart from HPI, are negative.  Past Medical History:  Diagnosis Date  . Depression 2006  . GERD (gastroesophageal reflux disease) 2006    Past Surgical History:  Procedure Laterality Date  . TONSILLECTOMY Bilateral Childhood  . TUBAL LIGATION    . WRIST FRACTURE SURGERY Right    in childhood    Social History:   reports that she has been smoking cigarettes. She has a 12.60 pack-year smoking history. She has never used smokeless tobacco. She reports that she does not drink alcohol and does not use drugs.  Allergies  Allergen Reactions  . Atorvastatin     Hair loss    Family History  Problem Relation Age of Onset  . Hypertension Mother   . Diverticulosis Mother   . Healthy Father   . Hypertension Sister   . Healthy Sister   . Healthy Brother   . Healthy Sister   . Alcohol abuse Brother   . Alcohol abuse Brother   . Cirrhosis Brother   . Cirrhosis Brother   . Colon cancer Neg Hx   . Breast cancer Neg Hx   . Ovarian cancer Neg Hx   . Cervical cancer Neg Hx      Prior to Admission medications   Medication Sig Start Date End Date Taking? Authorizing Provider  albuterol (VENTOLIN HFA) 108 (90 Base) MCG/ACT inhaler Inhale 2 puffs into the lungs every 4 (four) hours as needed for wheezing or shortness of breath. 04/27/20  Sharion Balloon, NP  cholecalciferol (VITAMIN D) 1000 UNITS tablet Take 1,000 Units by mouth daily.    [provider]  cholecalciferol (VITAMIN D3) 25 MCG (1000 UNIT) tablet Take 1,000 Units by mouth daily.    [provider]  FLUoxetine (PROZAC) 20 MG capsule Take 2 capsules (40 mg total) by mouth daily. Patient taking differently: Take 40 mg by mouth at bedtime. 03/25/20   Trinna Post, PA-C  Melatonin 10 MG TABS Take 10 mg by mouth at bedtime.    [provider]  Omega-3 Fatty Acids (FISH OIL) 1000 MG CAPS Take 1,000 mg by mouth daily.    [provider]  omeprazole (PRILOSEC) 20 MG capsule TAKE 1 CAPSULE BY MOUTH ONCE A DAY 10/07/20   Bacigalupo, Dionne Bucy, MD  rosuvastatin (CRESTOR) 10 MG tablet Take 1 tablet (10 mg total) by mouth daily. 05/18/20   Trinna Post, PA-C    Physical Exam: Vitals:   11/14/20 0000 11/14/20 0100 11/14/20 0300  BP:  115/83 119/74  Pulse:  85 90  Resp: 16 16 18   Temp: 97.9 F (36.6 C) 98.1 F (36.7 C) 98.5 F (36.9 C)  TempSrc: Oral Oral Oral  SpO2:  94% 98%    Constitutional: NAD, calm  Eyes: PERTLA, lids and conjunctivae normal ENMT: Mucous membranes are moist. Posterior pharynx clear of any exudate or lesions.   Neck:  supple, no masses  Respiratory: no wheezing, no crackles. No accessory muscle use.  Cardiovascular: S1 & S2 heard, regular rate and rhythm. No extremity edema.   Abdomen: No distension, no tenderness, soft. Bowel sounds active.  Musculoskeletal: no clubbing / cyanosis. No joint deformity upper and lower extremities.   Skin: no significant rashes, lesions, ulcers. Warm, dry, well-perfused. Neurologic: CN 2-12 grossly intact. Sensation intact. Moving all extremities.  Psychiatric: Alert and oriented to person, place, and situation. Very pleasant and cooperative.    Labs and Imaging on Admission: I have personally reviewed following labs and imaging studies  CBC: Recent Labs  Lab 11/13/20 1022 11/13/20 1912 11/14/20 0151  WBC 5.8  --   --   HGB 10.9* 9.0* 9.7*  HCT 36.4  --  30.8*  MCV 88.1  --   --   PLT 344  --   --    Basic Metabolic Panel: Recent Labs  Lab 11/13/20 1022 11/13/20 1912 11/14/20 0151  NA 135  --  137  K 4.4  --  3.9  CL 105  --  108  CO2 21*  --  23  GLUCOSE 105* 159* 107*  BUN 10  --  6*  CREATININE 0.90  --  0.83  CALCIUM 8.3*  --  8.4*   GFR: Estimated Creatinine Clearance: 85 mL/min (by C-G formula based on SCr of 0.83 mg/dL). Liver Function Tests: Recent Labs  Lab 11/13/20 1022  AST 29  ALT 25  ALKPHOS 77  BILITOT  0.7  PROT 7.0  ALBUMIN 3.7   No results for input(s): LIPASE, AMYLASE in the last 168 hours. No results for input(s): AMMONIA in the last 168 hours. Coagulation Profile: Recent Labs  Lab 11/13/20 1022  INR 0.9   Cardiac Enzymes: No results for input(s): CKTOTAL, CKMB, CKMBINDEX, TROPONINI in the last 168 hours. BNP (last 3 results) No results for input(s): PROBNP in the last 8760 hours. HbA1C: No results for input(s): HGBA1C in the last 72 hours. CBG: Recent Labs  Lab 11/13/20 1859 11/13/20 1916  GLUCAP 157* 144*  Lipid Profile: No results for input(s): CHOL, HDL, LDLCALC, TRIG, CHOLHDL, LDLDIRECT in the last 72 hours. Thyroid Function Tests: No results for input(s): TSH, T4TOTAL, FREET4, T3FREE, THYROIDAB in the last 72 hours. Anemia Panel: No results for input(s): VITAMINB12, FOLATE, FERRITIN, TIBC, IRON, RETICCTPCT in the last 72 hours. Urine analysis:    Component Value Date/Time   BILIRUBINUR neg 05/18/2016 1209   PROTEINUR neg 05/18/2016 1209   UROBILINOGEN 0.2 05/18/2016 1209   NITRITE neg 05/18/2016 1209   LEUKOCYTESUR Trace (A) 05/18/2016 1209   Sepsis Labs: @LABRCNTIP (procalcitonin:4,lacticidven:4) ) Recent Results (from the past 240 hour(s))  SARS CORONAVIRUS 2 (TAT 6-24 HRS) Nasopharyngeal Nasopharyngeal Swab     Status: None   Collection Time: 11/13/20 11:23 AM   Specimen: Nasopharyngeal Swab  Result Value Ref Range Status   SARS Coronavirus 2 NEGATIVE NEGATIVE Final    Comment: (NOTE) SARS-CoV-2 target nucleic acids are NOT DETECTED.  The SARS-CoV-2 RNA is generally detectable in upper and lower respiratory specimens during the acute phase of infection. Negative results do not preclude SARS-CoV-2 infection, do not rule out co-infections with other pathogens, and should not be used as the sole basis for treatment or other patient management decisions. Negative results must be combined with clinical observations, patient history, and  epidemiological information. The expected result is Negative.  Fact Sheet for Patients: SugarRoll.be  Fact Sheet for Healthcare Providers: https://www.woods-mathews.com/  This test is not yet approved or cleared by the [place] FDA and  has been authorized for detection and/or diagnosis of SARS-CoV-2 by FDA under an Emergency Use Authorization (EUA). This EUA will remain  in effect (meaning this test can be used) for the duration of the COVID-19 declaration under Se ction 564(b)(1) of the Act, 21 U.S.C. section 360bbb-3(b)(1), unless the authorization is terminated or revoked sooner.  Performed at [facility], [place] [address], [place], [place] [address]   MRSA PCR Screening     Status: None   Collection Time: 11/13/20  7:25 PM   Specimen: Nasopharyngeal  Result Value Ref Range Status   MRSA by PCR NEGATIVE NEGATIVE Final    Comment:        The GeneXpert MRSA Assay (FDA approved for NASAL specimens only), is one component of a comprehensive MRSA colonization surveillance program. It is not intended to diagnose MRSA infection nor to guide or monitor treatment for MRSA infections. Performed at [facility], [address], [place], [place] [address]      Radiological Exams on Admission: NM GI Blood Loss  Result Date: 11/13/2020 CLINICAL DATA:  Bright red blood per rectum. EXAM: NUCLEAR MEDICINE GASTROINTESTINAL BLEEDING SCAN TECHNIQUE: Sequential abdominal images were obtained following intravenous administration of Tc-34m labeled red blood cells. RADIOPHARMACEUTICALS:  21.03 mCi Tc-43m pertechnetate in-vitro labeled red cells. COMPARISON:  None FINDINGS: Activity is noted in the region of the transverse colon and splenic flexure which does not show significant movement. Findings consistent with a primary colonic bleed. IMPRESSION: GI bleed localized to the transverse colon and splenic flexure. Electronically Signed    By: Marijo Sanes M.D.   On: 11/13/2020 17:14    EKG: Independently reviewed. Sinus rhythm.   Assessment/Plan   1. Painless hematochezia; acute blood-loss anemia  - Presents with one day of painless hematochezia, initially admitted to [facility] where she was seen by GI, Hgb dropped to 9.0 from baseline in 12-range and she has a syncopal episode, was given 1 unit RBC and IVF, and transferred to [facility] at recommendation of  GI  - She is now hemodynamically stable with no further bleeding since leaving [facility] and post-transfusion Hgb is 9.7  - IR aware of patient and localized bleeding on nuc med scan but advised continued management with IVF and RBCs  - Trend H&H, transfuse as needed, continue NPO and IVF, consult GI, call IR if life-threatening bleeding returns      DVT prophylaxis: SCDs  Code Status: Full  Level of Care: Level of care: Progressive Family Communication: None present  Disposition Plan:  Patient is from: home  Anticipated d/c is to: Home  Anticipated d/c date is: 11/16/20 Patient currently: Pending repeat H&H, GI eval  Consults called: Message sent to GI with request for routine AM consult  Admission status: Inpatient     Vianne Bulls, MD [facility]  11/14/2020, 3:37 AM

## 2020-11-14 NOTE — Progress Notes (Signed)
PROGRESS NOTE    Deborah Jenkins  WGN:[id number] DOB: [birth year] DOA: 11/13/2020 PCP: Erasmo Downer, MD    Brief Narrative:  Mrs. Dewilde was admitted to the hospital with working diagnosis of lower GI bleeding.  65 year old female past medical history for diverticulosis, dyslipidemia and depression who presented with rectal bleeding.  She reported 24 hours of diarrhea with dark stools, hematochezia with blood clots.  No abdominal pain.  Initially evaluated the ER [facility], she received 1 unit packed red blood cells and a nuclear medicine scan was performed.  It was localized a bleeding spot at the transverse colon and splenic flexure.  Transferred to [facility] for further invasive management. Her vital signs at the time of transfer blood pressure 115/83, heart rate 85, respirate 16, temperature 98.1, oxygen saturation 94%, her lungs are clear to auscultation bilaterally, heart S1-S2, present rhythmic, soft abdomen, no lower extremity edema.  Sodium 135, potassium 4.4, chloride 105, bicarb 21, glucose 105, BUN 10, creatinine 0.9, white count 5.8, hemoglobin 10.9, hematocrit 36.4 and platelets 344. SARS COVID-19 negative.  EKG 90 bpm, normal axis normal intervals, sinus rhythm, no ST segment T wave changes.  Assessment & Plan:   Principal Problem:   Acute lower GI bleeding Active Problems:   Depression   GERD (gastroesophageal reflux disease)   Hyperlipidemia   Diverticulosis   Acute blood loss anemia   1. Acute blood loss anemia due to lower GI bleed, diverticular bleed. Sp 1 unit PRBC transfusion.  Hb is 9,5 and Hct at 29,4. No hematochezia or melena today.   Continue with clear liquid diet and follow cell count in am. If no further bleeding plan for dc home in am, and have colonoscopy as outpatient.  No indication for IR procedure at this time.   Hydration with balanced electrolyte solutions with dextrose at 50 ml per hr.   2. Dyslipidemia. Resume statin therapy.  3. Depression.  Continue with fluoxetine.  4. GERD. Resume pantoprazole.    Status is: Inpatient  Remains inpatient appropriate because:IV treatments appropriate due to intensity of illness or inability to take PO   Dispo: The patient is from: Home              Anticipated d/c is to: Home              Patient currently is not medically stable to d/c.   Difficult to place patient No   DVT prophylaxis: scd   Code Status:   full  Family Communication:  No family at the bedside       Consultants:   GI   IR       Subjective: Patient with no abdominal pain, no nausea or vomiting, no chest pain. No melena or hematochezia today.   Objective: Vitals:   11/14/20 0100 11/14/20 0300 11/14/20 0833 11/14/20 1151  BP: 115/83 119/74 105/76 115/83  Pulse: 85 90 86 84  Resp: 16 18 14 20   Temp: 98.1 F (36.7 C) 98.5 F (36.9 C) 98.8 F (37.1 C) 98.9 F (37.2 C)  TempSrc: Oral Oral Oral Oral  SpO2: 94% 98% 95% 94%    Intake/Output Summary (Last 24 hours) at 11/14/2020 1352 Last data filed at 11/14/2020 1222 Gross per 24 hour  Intake --  Output 2000 ml  Net -2000 ml   There were no vitals filed for this visit.  Examination:   General: Not in pain or dyspnea,  Neurology: Awake and alert, non focal  E ENT: no pallor, no icterus, oral  mucosa moist Cardiovascular: No JVD. S1-S2 present, rhythmic, no gallops, rubs, or murmurs. No lower extremity edema. Pulmonary: positive breath sounds bilaterally, adequate air movement, no wheezing, rhonchi or rales. Gastrointestinal. Abdomen soft and non tender Skin. No rashes Musculoskeletal: no joint deformities     Data Reviewed: I have personally reviewed following labs and imaging studies  CBC: Recent Labs  Lab 11/13/20 1022 11/13/20 1912 11/14/20 0151 11/14/20 0915  WBC 5.8  --   --   --   HGB 10.9* 9.0* 9.7* 9.5*  HCT 36.4  --  30.8* 29.4*  MCV 88.1  --   --   --   PLT 344  --   --   --    Basic Metabolic Panel: Recent Labs   Lab 11/13/20 1022 11/13/20 1912 11/14/20 0151  NA 135  --  137  K 4.4  --  3.9  CL 105  --  108  CO2 21*  --  23  GLUCOSE 105* 159* 107*  BUN 10  --  6*  CREATININE 0.90  --  0.83  CALCIUM 8.3*  --  8.4*   GFR: Estimated Creatinine Clearance: 85 mL/min (by C-G formula based on SCr of 0.83 mg/dL). Liver Function Tests: Recent Labs  Lab 11/13/20 1022  AST 29  ALT 25  ALKPHOS 77  BILITOT 0.7  PROT 7.0  ALBUMIN 3.7   No results for input(s): LIPASE, AMYLASE in the last 168 hours. No results for input(s): AMMONIA in the last 168 hours. Coagulation Profile: Recent Labs  Lab 11/13/20 1022  INR 0.9   Cardiac Enzymes: No results for input(s): CKTOTAL, CKMB, CKMBINDEX, TROPONINI in the last 168 hours. BNP (last 3 results) No results for input(s): PROBNP in the last 8760 hours. HbA1C: No results for input(s): HGBA1C in the last 72 hours. CBG: Recent Labs  Lab 11/13/20 1859 11/13/20 1916  GLUCAP 157* 144*   Lipid Profile: No results for input(s): CHOL, HDL, LDLCALC, TRIG, CHOLHDL, LDLDIRECT in the last 72 hours. Thyroid Function Tests: No results for input(s): TSH, T4TOTAL, FREET4, T3FREE, THYROIDAB in the last 72 hours. Anemia Panel: No results for input(s): VITAMINB12, FOLATE, FERRITIN, TIBC, IRON, RETICCTPCT in the last 72 hours.    Radiology Studies: I have reviewed all of the imaging during this hospital visit personally     Scheduled Meds: Continuous Infusions: . sodium chloride 110 mL/hr at 11/14/20 0322     LOS: 1 day        Kahne Helfand Annett Gula, MD

## 2020-11-15 DIAGNOSIS — K219 Gastro-esophageal reflux disease without esophagitis: Secondary | ICD-10-CM | POA: Diagnosis not present

## 2020-11-15 DIAGNOSIS — K5790 Diverticulosis of intestine, part unspecified, without perforation or abscess without bleeding: Secondary | ICD-10-CM | POA: Diagnosis not present

## 2020-11-15 DIAGNOSIS — Z8249 Family history of ischemic heart disease and other diseases of the circulatory system: Secondary | ICD-10-CM | POA: Diagnosis not present

## 2020-11-15 DIAGNOSIS — F17210 Nicotine dependence, cigarettes, uncomplicated: Secondary | ICD-10-CM | POA: Diagnosis not present

## 2020-11-15 DIAGNOSIS — K5731 Diverticulosis of large intestine without perforation or abscess with bleeding: Secondary | ICD-10-CM

## 2020-11-15 DIAGNOSIS — E7800 Pure hypercholesterolemia, unspecified: Secondary | ICD-10-CM | POA: Diagnosis not present

## 2020-11-15 DIAGNOSIS — F331 Major depressive disorder, recurrent, moderate: Secondary | ICD-10-CM | POA: Diagnosis not present

## 2020-11-15 DIAGNOSIS — F32A Depression, unspecified: Secondary | ICD-10-CM | POA: Diagnosis not present

## 2020-11-15 DIAGNOSIS — D62 Acute posthemorrhagic anemia: Secondary | ICD-10-CM | POA: Diagnosis not present

## 2020-11-15 DIAGNOSIS — K922 Gastrointestinal hemorrhage, unspecified: Secondary | ICD-10-CM | POA: Diagnosis not present

## 2020-11-15 DIAGNOSIS — E785 Hyperlipidemia, unspecified: Secondary | ICD-10-CM | POA: Diagnosis not present

## 2020-11-15 LAB — HEMOGLOBIN AND HEMATOCRIT, BLOOD
HCT: 28.9 % — ABNORMAL LOW (ref 36.0–46.0)
Hemoglobin: 9.3 g/dL — ABNORMAL LOW (ref 12.0–15.0)

## 2020-11-15 LAB — HIV ANTIBODY (ROUTINE TESTING W REFLEX): HIV Screen 4th Generation wRfx: NONREACTIVE

## 2020-11-15 SURGERY — COLONOSCOPY
Anesthesia: Monitor Anesthesia Care

## 2020-11-15 NOTE — Discharge Summary (Signed)
Physician Discharge Summary  Deborah Jenkins QQI:[id number] DOB: 01/30/1956 DOA: 11/13/2020  PCP: Virginia Crews, MD  Admit date: 11/13/2020 Discharge date: 11/15/2020  Admitted From: Home  Disposition:  Home   Recommendations for Outpatient Follow-up and new medication changes:  1. Follow up with Dr. Brita Romp in 7 days.   Home Health: no   Equipment/Devices: no    Discharge Condition: stable  CODE STATUS: full  Diet recommendation: regular   Brief/Interim Summary: Deborah Jenkins was admitted to the hospital with working diagnosis of lower GI bleeding due to diverticulosis.  65 year old female past medical history for diverticulosis, dyslipidemia and depression who presented with rectal bleeding.  She reported 24 hours of diarrhea with dark stools, hematochezia with blood clots.  No abdominal pain.  Initially evaluated the ER [facility], she received 1 unit packed red blood cells and a nuclear medicine scan was performed.  It was localized a bleeding spot at the transverse colon and splenic flexure.  Transferred to [facility] for further invasive management. Her vital signs at the time of transfer blood pressure 115/83, heart rate 85, respiratory rate 16, temperature 98.1, oxygen saturation 94%, her lungs were clear to auscultation bilaterally, heart S1-S2, present rhythmic, soft abdomen, no lower extremity edema.  Sodium 135, potassium 4.4, chloride 105, bicarb 21, glucose 105, BUN 10, creatinine 0.9, white count 5.8, hemoglobin 10.9, hematocrit 36.4 and platelets 344. SARS COVID-19 negative.  EKG 90 bpm, normal axis normal intervals, sinus rhythm, no ST segment T wave changes.  Patient evaluated by IR and GI, no further bleeding.   1. Acute blood loss anemia due to lower GI bleed, diverticular hemorrhage.  Patient had no further melena or hematochezia during her hospitalization, her hemoglobin remained stable 9.7, 9.5, 9.3.  She remained on clear liquid diet for 24 hours with good  toleration.  In 2015 her colonoscopy showed left colon diverticulosis otherwise unremarkable with no polyps.  She is current with colorectal cancer screening, not due for colonoscopy.  2.  Dyslipidemia.  Continue statin therapy.  3.  Depression.  On fluoxetine.  4.  GERD.  Continue pantoprazole.   Discharge Diagnoses:  Principal Problem:   Acute lower GI bleeding Active Problems:   Depression   GERD (gastroesophageal reflux disease)   Hyperlipidemia   Diverticulosis   Acute blood loss anemia    Discharge Instructions   Allergies as of 11/15/2020      Reactions   Atorvastatin    Hair loss      Medication List    TAKE these medications   albuterol 108 (90 Base) MCG/ACT inhaler Commonly known as: VENTOLIN HFA Inhale 2 puffs into the lungs every 4 (four) hours as needed for wheezing or shortness of breath.   cholecalciferol 25 MCG (1000 UNIT) tablet Commonly known as: VITAMIN D3 Take 1,000 Units by mouth daily.   Fish Oil 1000 MG Caps Take 1,000 mg by mouth daily.   FLUoxetine 20 MG capsule Commonly known as: PROZAC Take 2 capsules (40 mg total) by mouth daily. What changed:   how much to take  when to take this   fluticasone 50 MCG/ACT nasal spray Commonly known as: FLONASE Place 1 spray into both nostrils daily as needed for allergies or rhinitis.   hydrocortisone cream 0.5 % Apply 1 application topically daily as needed for itching (on elbows).   MAG-CAPS PO Take 1 tablet by mouth daily.   Melatonin 10 MG Tabs Take 10 mg by mouth at bedtime.   omeprazole 20 MG capsule Commonly  known as: PRILOSEC TAKE 1 CAPSULE BY MOUTH ONCE A DAY   rosuvastatin 10 MG tablet Commonly known as: Crestor Take 1 tablet (10 mg total) by mouth daily. What changed: when to take this       Allergies  Allergen Reactions   Atorvastatin     Hair loss    Consultations:  GI   IR    Procedures/Studies: NM GI Blood Loss  Result Date: 11/13/2020 CLINICAL  DATA:  Bright red blood per rectum. EXAM: NUCLEAR MEDICINE GASTROINTESTINAL BLEEDING SCAN TECHNIQUE: Sequential abdominal images were obtained following intravenous administration of Tc-88m labeled red blood cells. RADIOPHARMACEUTICALS:  21.03 mCi Tc-15m pertechnetate in-vitro labeled red cells. COMPARISON:  None FINDINGS: Activity is noted in the region of the transverse colon and splenic flexure which does not show significant movement. Findings consistent with a primary colonic bleed. IMPRESSION: GI bleed localized to the transverse colon and splenic flexure. Electronically Signed   By: Marijo Sanes M.D.   On: 11/13/2020 17:14        Subjective: Patient is feeling well no further melena or hematochezia. No nausea or vomiting, no chest pain or dyspnea.   Discharge Exam: Vitals:   11/15/20 0559 11/15/20 0814  BP: 119/74 115/76  Pulse: 84 90  Resp: 14 17  Temp: 98.4 F (36.9 C) 97.9 F (36.6 C)  SpO2: 97% 95%   Vitals:   11/14/20 1937 11/15/20 0147 11/15/20 0559 11/15/20 0814  BP: (!) 124/96 120/80 119/74 115/76  Pulse: 88 100 84 90  Resp: 19 20 14 17   Temp: 98.2 F (36.8 C) 98.3 F (36.8 C) 98.4 F (36.9 C) 97.9 F (36.6 C)  TempSrc: Oral Oral Oral Oral  SpO2: 96% 95% 97% 95%    General: Not in pain or dyspnea.  Neurology: Awake and alert, non focal  E ENT: no pallor, no icterus, oral mucosa moist Cardiovascular: No JVD. S1-S2 present, rhythmic, no gallops, rubs, or murmurs. No lower extremity edema. Pulmonary: vesicular breath sounds bilaterally, adequate air movement, no wheezing, rhonchi or rales. Gastrointestinal. Abdomen with, no organomegaly, non tender, no rebound or guarding Skin. No rashes Musculoskeletal: no joint deformities   The results of significant diagnostics from this hospitalization (including imaging, microbiology, ancillary and laboratory) are listed below for reference.     Microbiology: Recent Results (from the past 240 hour(s))  SARS  CORONAVIRUS 2 (TAT 6-24 HRS) Nasopharyngeal Nasopharyngeal Swab     Status: None   Collection Time: 11/13/20 11:23 AM   Specimen: Nasopharyngeal Swab  Result Value Ref Range Status   SARS Coronavirus 2 NEGATIVE NEGATIVE Final    Comment: (NOTE) SARS-CoV-2 target nucleic acids are NOT DETECTED.  The SARS-CoV-2 RNA is generally detectable in upper and lower respiratory specimens during the acute phase of infection. Negative results do not preclude SARS-CoV-2 infection, do not rule out co-infections with other pathogens, and should not be used as the sole basis for treatment or other patient management decisions. Negative results must be combined with clinical observations, patient history, and epidemiological information. The expected result is Negative.  Fact Sheet for Patients: SugarRoll.be  Fact Sheet for Healthcare Providers: https://www.woods-mathews.com/  This test is not yet approved or cleared by the [place] FDA and  has been authorized for detection and/or diagnosis of SARS-CoV-2 by FDA under an Emergency Use Authorization (EUA). This EUA will remain  in effect (meaning this test can be used) for the duration of the COVID-19 declaration under Se ction 564(b)(1) of the Act, 21 U.S.C. section  360bbb-3(b)(1), unless the authorization is terminated or revoked sooner.  Performed at [facility], [place] [address], [place], [place] [address]   MRSA PCR Screening     Status: None   Collection Time: 11/13/20  7:25 PM   Specimen: Nasopharyngeal  Result Value Ref Range Status   MRSA by PCR NEGATIVE NEGATIVE Final    Comment:        The GeneXpert MRSA Assay (FDA approved for NASAL specimens only), is one component of a comprehensive MRSA colonization surveillance program. It is not intended to diagnose MRSA infection nor to guide or monitor treatment for MRSA infections. Performed at [facility], [address] [place], [place] [address]      Labs: BNP (last 3 results) No results for input(s): BNP in the last 8760 hours. Basic Metabolic Panel: Recent Labs  Lab 11/13/20 1022 11/13/20 1912 11/14/20 0151  NA 135  --  137  K 4.4  --  3.9  CL 105  --  108  CO2 21*  --  23  GLUCOSE 105* 159* 107*  BUN 10  --  6*  CREATININE 0.90  --  0.83  CALCIUM 8.3*  --  8.4*   Liver Function Tests: Recent Labs  Lab 11/13/20 1022  AST 29  ALT 25  ALKPHOS 77  BILITOT 0.7  PROT 7.0  ALBUMIN 3.7   No results for input(s): LIPASE, AMYLASE in the last 168 hours. No results for input(s): AMMONIA in the last 168 hours. CBC: Recent Labs  Lab 11/13/20 1022 11/13/20 1912 11/14/20 0151 11/14/20 0915 11/15/20 0331  WBC 5.8  --   --   --   --   HGB 10.9* 9.0* 9.7* 9.5* 9.3*  HCT 36.4  --  30.8* 29.4* 28.9*  MCV 88.1  --   --   --   --   PLT 344  --   --   --   --    Cardiac Enzymes: No results for input(s): CKTOTAL, CKMB, CKMBINDEX, TROPONINI in the last 168 hours. BNP: Invalid input(s): POCBNP CBG: Recent Labs  Lab 11/13/20 1859 11/13/20 1916  GLUCAP 157* 144*   D-Dimer No results for input(s): DDIMER in the last 72 hours. Hgb A1c No results for input(s): HGBA1C in the last 72 hours. Lipid Profile No results for input(s): CHOL, HDL, LDLCALC, TRIG, CHOLHDL, LDLDIRECT in the last 72 hours. Thyroid function studies No results for input(s): TSH, T4TOTAL, T3FREE, THYROIDAB in the last 72 hours.  Invalid input(s): FREET3 Anemia work up No results for input(s): VITAMINB12, FOLATE, FERRITIN, TIBC, IRON, RETICCTPCT in the last 72 hours. Urinalysis    Component Value Date/Time   BILIRUBINUR neg 05/18/2016 1209   PROTEINUR neg 05/18/2016 1209   UROBILINOGEN 0.2 05/18/2016 1209   NITRITE neg 05/18/2016 1209   LEUKOCYTESUR Trace (A) 05/18/2016 1209   Sepsis Labs Invalid input(s): PROCALCITONIN,  WBC,  LACTICIDVEN Microbiology Recent Results (from the past 240 hour(s))  SARS  CORONAVIRUS 2 (TAT 6-24 HRS) Nasopharyngeal Nasopharyngeal Swab     Status: None   Collection Time: 11/13/20 11:23 AM   Specimen: Nasopharyngeal Swab  Result Value Ref Range Status   SARS Coronavirus 2 NEGATIVE NEGATIVE Final    Comment: (NOTE) SARS-CoV-2 target nucleic acids are NOT DETECTED.  The SARS-CoV-2 RNA is generally detectable in upper and lower respiratory specimens during the acute phase of infection. Negative results do not preclude SARS-CoV-2 infection, do not rule out co-infections with other pathogens, and should not be  used as the sole basis for treatment or other patient management decisions. Negative results must be combined with clinical observations, patient history, and epidemiological information. The expected result is Negative.  Fact Sheet for Patients: SugarRoll.be  Fact Sheet for Healthcare Providers: https://www.woods-mathews.com/  This test is not yet approved or cleared by the [place] FDA and  has been authorized for detection and/or diagnosis of SARS-CoV-2 by FDA under an Emergency Use Authorization (EUA). This EUA will remain  in effect (meaning this test can be used) for the duration of the COVID-19 declaration under Se ction 564(b)(1) of the Act, 21 U.S.C. section 360bbb-3(b)(1), unless the authorization is terminated or revoked sooner.  Performed at [facility], [place] [address], [place], [place] [address]   MRSA PCR Screening     Status: None   Collection Time: 11/13/20  7:25 PM   Specimen: Nasopharyngeal  Result Value Ref Range Status   MRSA by PCR NEGATIVE NEGATIVE Final    Comment:        The GeneXpert MRSA Assay (FDA approved for NASAL specimens only), is one component of a comprehensive MRSA colonization surveillance program. It is not intended to diagnose MRSA infection nor to guide or monitor treatment for MRSA infections. Performed at [facility], [address] [place], [place] [address]      Time coordinating discharge: 45 minutes  SIGNED:   Tawni Millers, MD  [facility] 11/15/2020, 8:27 AM

## 2020-11-15 NOTE — Progress Notes (Signed)
 GASTROENTEROLOGY ROUNDING NOTE   Subjective: No further episodes of hematochezia. Overall feeling well.   Objective: Vital signs in last 24 hours: Temp:  [97.9 F (36.6 C)-98.9 F (37.2 C)] 97.9 F (36.6 C) (03/13 0814) Pulse Rate:  [84-100] 90 (03/13 0814) Resp:  [14-20] 17 (03/13 0814) BP: (115-127)/(72-96) 115/76 (03/13 0814) SpO2:  [94 %-97 %] 95 % (03/13 0814) Last BM Date: 11/14/20 General: NAD   Intake/Output from previous day: 03/12 0701 - 03/13 0700 In: 640 [P.O.:640] Out: 2600 [Urine:2600] Intake/Output this shift: No intake/output data recorded.   Lab Results: Recent Labs    11/13/20 1022 11/13/20 1912 11/14/20 0151 11/14/20 0915 11/15/20 0331  WBC 5.8  --   --   --   --   HGB 10.9*   < > 9.7* 9.5* 9.3*  PLT 344  --   --   --   --   MCV 88.1  --   --   --   --    < > = values in this interval not displayed.   BMET Recent Labs    11/13/20 1022 11/13/20 1912 11/14/20 0151  NA 135  --  137  K 4.4  --  3.9  CL 105  --  108  CO2 21*  --  23  GLUCOSE 105* 159* 107*  BUN 10  --  6*  CREATININE 0.90  --  0.83  CALCIUM 8.3*  --  8.4*   LFT Recent Labs    11/13/20 1022  PROT 7.0  ALBUMIN 3.7  AST 29  ALT 25  ALKPHOS 77  BILITOT 0.7   PT/INR Recent Labs    11/13/20 1022  INR 0.9      Imaging/Other results: NM GI Blood Loss  Result Date: 11/13/2020 CLINICAL DATA:  Bright red blood per rectum. EXAM: NUCLEAR MEDICINE GASTROINTESTINAL BLEEDING SCAN TECHNIQUE: Sequential abdominal images were obtained following intravenous administration of Tc-98m labeled red blood cells. RADIOPHARMACEUTICALS:  21.03 mCi Tc-57m pertechnetate in-vitro labeled red cells. COMPARISON:  None FINDINGS: Activity is noted in the region of the transverse colon and splenic flexure which does not show significant movement. Findings consistent with a primary colonic bleed. IMPRESSION: GI bleed localized to the transverse colon and splenic flexure. Electronically  Signed   By: Marijo Sanes M.D.   On: 11/13/2020 17:14      Assessment &Plan  28 yr F with h/o diverticulosis transferred from [facility] with hematochezia likely secondary to diverticular hemorrhage. No further bleeding Advance diet as tolerated Ok to discharge home and follow up with GI in [place] , [place] [phone]  [facility] Gastroenterology

## 2020-11-16 ENCOUNTER — Other Ambulatory Visit: Payer: Self-pay | Admitting: *Deleted

## 2020-11-16 NOTE — Progress Notes (Signed)
Established patient visit   Patient: Deborah Jenkins   DOB: [birth year]   65 y.o. Female  MRN: [id number] Visit Date: 11/17/2020  Today's healthcare provider: Trinna Post, PA-C   Chief Complaint  Patient presents with  . GI Bleeding   Subjective    HPI  Patient is a 65 year old female who presents for follow up after a 2 day hospitalization for lower GI bleed. She has history of diverticulosis on colonoscopy from 2015.  She was admitted with Hgb of 10.9 and discharged with Hgb of 9.3.  She did get 1 unit of blood and bleed was localized to splenic flexure. She did not ultimately require any intervention. She was admitted on 3/11 and discharged on 3/11. She did not have colonoscopy during her hospital stay.  Patient states that they told her since she was stable they preferred she set it up as her screening colonoscopy.  Patient prefers to have this done at [facility] rather than at [facility] as she did in the past.  At the time of her discharge she was advised to continue Pantoprazole. Patient states she is doing better but still very weak.  She had her first formed bowel movement today and today is the first day she has not seen any blood. She also wants to know if she should continue her Aspirin.    Medications: Outpatient Medications Prior to Visit  Medication Sig  . albuterol (VENTOLIN HFA) 108 (90 Base) MCG/ACT inhaler Inhale 2 puffs into the lungs every 4 (four) hours as needed for wheezing or shortness of breath.  . cholecalciferol (VITAMIN D3) 25 MCG (1000 UNIT) tablet Take 1,000 Units by mouth daily.  Marland Kitchen FLUoxetine (PROZAC) 20 MG capsule Take 2 capsules (40 mg total) by mouth daily. (Patient taking differently: Take 20 mg by mouth at bedtime.)  . fluticasone (FLONASE) 50 MCG/ACT nasal spray Place 1 spray into both nostrils daily as needed for allergies or rhinitis.  . hydrocortisone cream 0.5 % Apply 1 application topically daily as needed for itching (on elbows).  . Magnesium  Oxide (MAG-CAPS PO) Take 1 tablet by mouth daily.  . Melatonin 10 MG TABS Take 10 mg by mouth at bedtime.  . Omega-3 Fatty Acids (FISH OIL) 1000 MG CAPS Take 1,000 mg by mouth daily.  Marland Kitchen omeprazole (PRILOSEC) 20 MG capsule TAKE 1 CAPSULE BY MOUTH ONCE A DAY  . rosuvastatin (CRESTOR) 10 MG tablet Take 1 tablet (10 mg total) by mouth daily. (Patient taking differently: Take 10 mg by mouth at bedtime.)   No facility-administered medications prior to visit.    Review of Systems  Constitutional: Negative for fever.  Gastrointestinal: Positive for diarrhea (none since yesterday). Negative for abdominal pain, blood in stool (As of today), constipation, nausea and vomiting.       Objective    BP 122/78 (BP Location: Left Arm, Patient Position: Sitting, Cuff Size: Normal)   Pulse 85   Temp 98.4 F (36.9 C) (Oral)   Wt 223 lb (101.2 kg)   SpO2 100%   BMI 35.99 kg/m     Physical Exam Constitutional:      Appearance: Normal appearance.  Cardiovascular:     Rate and Rhythm: Normal rate and regular rhythm.     Heart sounds: Normal heart sounds.  Pulmonary:     Effort: Pulmonary effort is normal.     Breath sounds: Normal breath sounds.  Abdominal:     General: Bowel sounds are normal.  Palpations: Abdomen is soft.     Tenderness: There is no abdominal tenderness.  Skin:    General: Skin is warm and dry.     Coloration: Skin is not pale.  Neurological:     Mental Status: She is alert and oriented to person, place, and time. Mental status is at baseline.  Psychiatric:        Mood and Affect: Mood normal.        Behavior: Behavior normal.       No results found for any visits on 11/17/20.  Assessment & Plan    1. Acute lower GI bleeding  Localized to splenic flexure, likely 2/2 diverticulosis. Patient received 1 unit of blood during admission but no other acute intervention. Will recheck CBC today and have her f/u w/ GI. Hold Aspirin for now. Counseled aspiring likely has  minimal benefit for her.  - CBC with Differential - Comprehensive Metabolic Panel (CMET) - Ambulatory referral to Gastroenterology  2. Diverticulosis of colon with hemorrhage  - CBC with Differential - Comprehensive Metabolic Panel (CMET) - Ambulatory referral to Gastroenterology  3. Pure hypercholesterolemia  Having difficulty tolerating statin due to hair loss. Can recheck lipid profile and revisit at next OV.  - Lipid Profile   No follow-ups on file.      ITrinna Post, PA-C, have reviewed all documentation for this visit. The documentation on 11/17/20 for the exam, diagnosis, procedures, and orders are all accurate and complete.  The entirety of the information documented in the History of Present Illness, Review of Systems and Physical Exam were personally obtained by me. Portions of this information were initially documented by Clinton Quant, CMA and reviewed by me for thoroughness and accuracy.        Paulene Floor  [facility] [phone] (phone) [phone] (fax)  [place]

## 2020-11-16 NOTE — Patient Outreach (Signed)
[facility] Care Management  11/16/2020  Deborah Jenkins Oct 19, 1955 [id number]   Transition of care call/case closure   Referral received:11/16/20 Initial outreach:11/16/20 Insurance: [place] [employer]    Subjective: Initial successful telephone call to patient's preferred number in order to complete transition of care assessment; 2 HIPAA identifiers verified. Explained purpose of call and completed transition of care assessment.  Deborah Jenkins states that she is still weak , but doing good. She denies abdominal pain, nausea, dizziness,she reports having soft formed bowel movment on today she reports noting blood within stool and when wiping,describes  not as bad as before.  She states that she did  not notify MD office  as she has an appointment on tomorrow., reinforced importance of seeking urgent  medical attention/ED for worsening symptoms sooner  due to recent hospitalization due to GI bleed.  She reports tolerating diet carefully, she is following a soft diet.  Children available to assist as needed in her  recovery.   Reviewed accessing the following [place] Benefits : She denies any ongoing health issues and says she does not need a referral to one of the [place] chronic disease management programs.  She is unsure if she has  the hospital indemnity but has access to follow up.  She  uses a Company secretary outpatient pharmacy at [facility] .      Objective:  Deborah Jenkins  was hospitalized at [facility] 3/11-3/13, for Lower GI bleed Diverticular hemorrhage,Anemia transferred from [facility] on 3/11. Comorbidities include:  Hyperlipidemia, Depression, Smoker  She  was discharged to home on 11/15/20 without the need for home health services or DME.   Assessment:  Patient voices good understanding of all discharge instructions.  See transition of care flowsheet for assessment details.   Plan:  Reviewed hospital discharge diagnosis of Lower GI bleed,  Diverticular hemorrhage   and discharge treatment plan using hospital discharge instructions, assessing medication adherence, reviewing problems requiring provider notification, and discussing the importance of follow up with surgeon, primary care provider and/or specialists as directed. Stressed importance of seeking medical attention for recurrent or worsening symptoms.   Reviewed [place] healthy lifestyle program information to receive discounted premium for  2023   Step 1: Get  your annual physical  Step 2: Complete your health assessment  Step 3:Identify your current health status and complete the corresponding action step between September 05, 2020 and May 06, 2021.      Patient agreeable to call in the next week for follow up .Will route successful outreach letter with [place] Management pamphlet and 24 Hour Nurse Line Magnet to [place] Management clinical pool to be mailed to patient's home address.   Joylene Draft, RN, BSN  [place] Management Coordinator  [phone]- Mobile [phone]- Toll Free Main Office

## 2020-11-17 ENCOUNTER — Encounter: Payer: Self-pay | Admitting: *Deleted

## 2020-11-17 ENCOUNTER — Other Ambulatory Visit: Payer: Self-pay

## 2020-11-17 ENCOUNTER — Ambulatory Visit (INDEPENDENT_AMBULATORY_CARE_PROVIDER_SITE_OTHER): Payer: 59 | Admitting: Physician Assistant

## 2020-11-17 VITALS — BP 122/78 | HR 85 | Temp 98.4°F | Wt 223.0 lb

## 2020-11-17 DIAGNOSIS — E7800 Pure hypercholesterolemia, unspecified: Secondary | ICD-10-CM | POA: Diagnosis not present

## 2020-11-17 DIAGNOSIS — K5731 Diverticulosis of large intestine without perforation or abscess with bleeding: Secondary | ICD-10-CM | POA: Diagnosis not present

## 2020-11-17 DIAGNOSIS — K922 Gastrointestinal hemorrhage, unspecified: Secondary | ICD-10-CM | POA: Diagnosis not present

## 2020-11-17 NOTE — Patient Instructions (Signed)

## 2020-11-18 LAB — COMPREHENSIVE METABOLIC PANEL
ALT: 30 IU/L (ref 0–32)
AST: 31 IU/L (ref 0–40)
Albumin/Globulin Ratio: 2 (ref 1.2–2.2)
Albumin: 4.2 g/dL (ref 3.8–4.8)
Alkaline Phosphatase: 86 IU/L (ref 44–121)
BUN/Creatinine Ratio: 10 — ABNORMAL LOW (ref 12–28)
BUN: 9 mg/dL (ref 8–27)
Bilirubin Total: <0.2 mg/dL (ref 0.0–1.2)
CO2: 21 mmol/L (ref 20–29)
Calcium: 8.9 mg/dL (ref 8.7–10.3)
Chloride: 104 mmol/L (ref 96–106)
Creatinine, Ser: 0.94 mg/dL (ref 0.57–1.00)
Globulin, Total: 2.1 g/dL (ref 1.5–4.5)
Glucose: 122 mg/dL — ABNORMAL HIGH (ref 65–99)
Potassium: 4.6 mmol/L (ref 3.5–5.2)
Sodium: 141 mmol/L (ref 134–144)
Total Protein: 6.3 g/dL (ref 6.0–8.5)
eGFR: 69 mL/min/{1.73_m2} (ref >59)

## 2020-11-18 LAB — LIPID PANEL
Chol/HDL Ratio: 3.1 ratio (ref 0.0–4.4)
Cholesterol, Total: 194 mg/dL (ref 100–199)
HDL: 63 mg/dL (ref >39)
LDL Chol Calc (NIH): 117 mg/dL — ABNORMAL HIGH (ref 0–99)
Triglycerides: 79 mg/dL (ref 0–149)
VLDL Cholesterol Cal: 14 mg/dL (ref 5–40)

## 2020-11-18 LAB — CBC WITH DIFFERENTIAL/PLATELET
Basophils Absolute: 0 10*3/uL (ref 0.0–0.2)
Basos: 0 %
EOS (ABSOLUTE): 0.1 10*3/uL (ref 0.0–0.4)
Eos: 2 %
Hematocrit: 30.4 % — ABNORMAL LOW (ref 34.0–46.6)
Hemoglobin: 9.5 g/dL — ABNORMAL LOW (ref 11.1–15.9)
Immature Grans (Abs): 0 10*3/uL (ref 0.0–0.1)
Immature Granulocytes: 0 %
Lymphocytes Absolute: 1.5 10*3/uL (ref 0.7–3.1)
Lymphs: 21 %
MCH: 26.5 pg — ABNORMAL LOW (ref 26.6–33.0)
MCHC: 31.3 g/dL — ABNORMAL LOW (ref 31.5–35.7)
MCV: 85 fL (ref 79–97)
Monocytes Absolute: 0.5 10*3/uL (ref 0.1–0.9)
Monocytes: 8 %
Neutrophils Absolute: 4.8 10*3/uL (ref 1.4–7.0)
Neutrophils: 69 %
Platelets: 318 10*3/uL (ref 150–450)
RBC: 3.59 x10E6/uL — ABNORMAL LOW (ref 3.77–5.28)
RDW: 14 % (ref 11.7–15.4)
WBC: 6.9 10*3/uL (ref 3.4–10.8)

## 2020-11-23 ENCOUNTER — Encounter: Payer: Self-pay | Admitting: Physician Assistant

## 2020-11-24 ENCOUNTER — Other Ambulatory Visit: Payer: Self-pay | Admitting: *Deleted

## 2020-11-24 NOTE — Patient Outreach (Signed)
[facility] Care Management  11/24/2020  Deborah Jenkins 1956-05-27 [id number]  Transition of care call/Follow up  Referral received: 11/16/20 Initial outreach attempt: 11/16/20 Insurance: [place]   Subjective: Unsuccessful outreach call to patient, she is unable to talk at this time, she is agreeable to return call later when off work.   Return call to patient no answer able to leave a HIPAA compliant voicemail message.    Objective: Deborah Jenkins was hospitalized [facility] 3/11-3/13, for Lower GI bleed Diverticular hemorrhage,Anemia transferred from [facility] on 3/11. Comorbidities include:  Hyperlipidemia, Depression, Smoker  She  was discharged to home on 11/15/20 without the need for home health servicesor DME.  Plan If no return call on today, will plan return call in the next 4 business days.   Joylene Draft, RN, BSN  [place] Management Coordinator  [phone]- Mobile [phone]- Toll Free Main Office

## 2020-11-26 ENCOUNTER — Other Ambulatory Visit: Payer: Self-pay | Admitting: *Deleted

## 2020-11-26 NOTE — Patient Outreach (Addendum)
[facility]) Care Management  11/26/2020  Deborah Jenkins [birth year] [id number]   Transition of care follow up call  Referral received: 11/16/20 Initial outreach attempt: 11/16/20 Insurance:[employer]R    Subjective Unsuccessful follow up call to patient, no answer able to leave a HIPAA compliant voicemail message for return call.   Objective[name] bleed Diverticular hemorrhage,Anemia transferred from [facility] on 3/11.Comorbidities include: Hyperlipidemia, Depression, Smoker  Shewas discharged to home on 3/13/22without the need for home health servicesor DME. Noted patient with post discharge follow up with PCP, and referral to GI has been made.    Plan: If no return call from patient, will plan return call in the next 3 weeks as previous agreed to follow up call.    Joylene Draft, RN, BSN  [place] Management Coordinator  [phone]- Mobile [phone]- Toll Free Main Office

## 2020-12-02 ENCOUNTER — Telehealth: Payer: Self-pay

## 2020-12-02 NOTE — Telephone Encounter (Signed)
Copied from [place] [id number]. Topic: Appointment Scheduling - Scheduling Inquiry for Clinic >> Dec 02, 2020 11:04 AM Rayann Heman wrote: Reason for CRM: Patient called and stated that she needs to have blood work done. I do not see any order in the system. Patient states that Fabio Bering had originally told patient to come back to get this done.

## 2020-12-02 NOTE — Telephone Encounter (Signed)
Please advise on CBC order.

## 2020-12-03 ENCOUNTER — Other Ambulatory Visit: Payer: Self-pay

## 2020-12-03 ENCOUNTER — Ambulatory Visit: Payer: 59 | Admitting: Gastroenterology

## 2020-12-03 ENCOUNTER — Other Ambulatory Visit: Payer: Self-pay | Admitting: *Deleted

## 2020-12-03 ENCOUNTER — Encounter: Payer: Self-pay | Admitting: Gastroenterology

## 2020-12-03 VITALS — BP 142/88 | HR 103 | Temp 98.2°F | Ht 67.0 in | Wt 221.0 lb

## 2020-12-03 DIAGNOSIS — D509 Iron deficiency anemia, unspecified: Secondary | ICD-10-CM | POA: Diagnosis not present

## 2020-12-03 DIAGNOSIS — D649 Anemia, unspecified: Secondary | ICD-10-CM | POA: Diagnosis not present

## 2020-12-03 DIAGNOSIS — Z1211 Encounter for screening for malignant neoplasm of colon: Secondary | ICD-10-CM

## 2020-12-03 DIAGNOSIS — R1319 Other dysphagia: Secondary | ICD-10-CM | POA: Diagnosis not present

## 2020-12-03 MED ORDER — NA SULFATE-K SULFATE-MG SULF 17.5-3.13-1.6 GM/177ML PO SOLN: ORAL | 0 refills | Status: DC

## 2020-12-03 NOTE — Telephone Encounter (Signed)
Ok to order CBC and iron panel for microcytic anemia

## 2020-12-03 NOTE — Telephone Encounter (Signed)
Labs ordered. Patient was advised.  

## 2020-12-03 NOTE — Progress Notes (Signed)
Deborah Jenkins [address]  [place]  [place], [place] [address]  Main: [phone]  Fax: [phone]   Gastroenterology Consultation  Referring Provider:     Trinna Post, PA-C Primary Care Physician:  Virginia Crews, MD Reason for Consultation:     Lower GI bleeding        HPI:    Chief Complaint  Patient presents with  . Diverticulosis    Deborah Jenkins is a 65 y.o. y/o female referred for consultation & management  by Dr. Brita Romp, Dionne Bucy, MD.  Patient recently admitted to the hospital with hematochezia.  Nuclear scan was positive for bleeding localized to the transverse colon splenic flexure.  Patient was transferred to [facility] in anticipation of embolization by IR.  However, they recommended conservative management and active bleeding resolved.  Patient did not have any nebulization or colonoscopy during her admission.  Since discharge symptoms have completely resolved.  Patient denies any previous history of similar symptoms.  Last colonoscopy was in 2015 which showed diverticulosis.  Lipoma in the ascending colon was biopsied.  Pathology report not available.  Patient is continuing to report fatigue  Is also reporting solid food dysphagia that occurs about once a month.  No prior upper endoscopy.  No episodes of food impaction.  Past Medical History:  Diagnosis Date  . Depression 2006  . GERD (gastroesophageal reflux disease) 2006    Past Surgical History:  Procedure Laterality Date  . TONSILLECTOMY Bilateral Childhood  . TUBAL LIGATION    . WRIST FRACTURE SURGERY Right    in childhood    Prior to Admission medications   Medication Sig Start Date End Date Taking? Authorizing Provider  albuterol (VENTOLIN HFA) 108 (90 Base) MCG/ACT inhaler Inhale 2 puffs into the lungs every 4 (four) hours as needed for wheezing or shortness of breath. 04/27/20  Yes Sharion Balloon, NP  cholecalciferol (VITAMIN D3) 25 MCG (1000 UNIT) tablet  Take 1,000 Units by mouth daily.   Yes [provider]  FLUoxetine (PROZAC) 20 MG capsule Take 2 capsules (40 mg total) by mouth daily. Patient taking differently: Take 20 mg by mouth at bedtime. 03/25/20  Yes Carles Collet M, PA-C  fluticasone (FLONASE) 50 MCG/ACT nasal spray Place 1 spray into both nostrils daily as needed for allergies or rhinitis.   Yes [provider]  hydrocortisone cream 0.5 % Apply 1 application topically daily as needed for itching (on elbows).   Yes [provider]  Magnesium Oxide (MAG-CAPS PO) Take 1 tablet by mouth daily.   Yes [provider]  Melatonin 10 MG TABS Take 10 mg by mouth at bedtime.   Yes [provider]  Na Sulfate-K Sulfate-Mg Sulf 17.5-3.13-1.6 GM/177ML SOLN At 5 PM the day before procedure take 1 bottle and 5 hours before procedure take 1 bottle. 12/03/20  Yes Deborah Jenkins B, MD  omeprazole (PRILOSEC) 20 MG capsule TAKE 1 CAPSULE BY MOUTH ONCE A DAY 10/07/20  Yes Bacigalupo, Dionne Bucy, MD  rosuvastatin (CRESTOR) 10 MG tablet Take 1 tablet (10 mg total) by mouth daily. Patient taking differently: Take 10 mg by mouth at bedtime. 05/18/20  Yes Trinna Post, PA-C    Family History  Problem Relation Age of Onset  . Hypertension Mother   . Diverticulosis Mother   . Healthy Father   . Hypertension Sister   . Healthy Sister   . Healthy Brother   . Healthy Sister   . Alcohol  abuse Brother   . Alcohol abuse Brother   . Cirrhosis Brother   . Cirrhosis Brother   . Colon cancer Neg Hx   . Breast cancer Neg Hx   . Ovarian cancer Neg Hx   . Cervical cancer Neg Hx      Social History   Tobacco Use  . Smoking status: Current Every Day Smoker    Packs/day: 0.30    Years: 42.00    Pack years: 12.60    Types: Cigarettes  . Smokeless tobacco: Never Used  . Tobacco comment: started smoking at 65 YO; is smoking 1/3 PPD  Vaping Use  . Vaping Use: Never used  Substance Use Topics  . Alcohol use: No     Alcohol/week: 0.0 standard drinks  . Drug use: No    Allergies as of 12/03/2020 - Review Complete 12/03/2020  Allergen Reaction Noted  . Atorvastatin  04/06/2018    Review of Systems:    All systems reviewed and negative except where noted in HPI.   Physical Exam:  BP (!) 142/88   Pulse (!) 103   Temp 98.2 F (36.8 C) (Oral)   Ht 5\' 7"  (1.702 m)   Wt 221 lb (100.2 kg)   BMI 34.61 kg/m  No LMP recorded. Patient is postmenopausal. Psych:  Alert and cooperative. Normal mood and affect. General:   Alert,  Well-developed, well-nourished, pleasant and cooperative in NAD Head:  Normocephalic and atraumatic. Eyes:  Sclera clear, no icterus.   Conjunctiva pink. Ears:  Normal auditory acuity. Nose:  No deformity, discharge, or lesions. Mouth:  No deformity or lesions,oropharynx pink & moist. Neck:  Supple; no masses or thyromegaly. Abdomen:  Normal bowel sounds.  No bruits.  Soft, non-tender and non-distended without masses, hepatosplenomegaly or hernias noted.  No guarding or rebound tenderness.    Msk:  Symmetrical without gross deformities. Good, equal movement & strength bilaterally. Pulses:  Normal pulses noted. Extremities:  No clubbing or edema.  No cyanosis. Neurologic:  Alert and oriented x3;  grossly normal neurologically. Skin:  Intact without significant lesions or rashes. No jaundice. Lymph Nodes:  No significant cervical adenopathy. Psych:  Alert and cooperative. Normal mood and affect.   Labs: CBC    Component Value Date/Time   WBC 6.9 11/17/2020 0851   WBC 5.8 11/13/2020 1022   RBC 3.59 (L) 11/17/2020 0851   RBC 4.13 11/13/2020 1022   HGB 9.5 (L) 11/17/2020 0851   HCT 30.4 (L) 11/17/2020 0851   PLT 318 11/17/2020 0851   MCV 85 11/17/2020 0851   MCH 26.5 (L) 11/17/2020 0851   MCH 26.4 11/13/2020 1022   MCHC 31.3 (L) 11/17/2020 0851   MCHC 29.9 (L) 11/13/2020 1022   RDW 14.0 11/17/2020 0851   LYMPHSABS 1.5 11/17/2020 0851   EOSABS 0.1 11/17/2020 0851    BASOSABS 0.0 11/17/2020 0851   CMP     Component Value Date/Time   NA 141 11/17/2020 0851   K 4.6 11/17/2020 0851   CL 104 11/17/2020 0851   CO2 21 11/17/2020 0851   GLUCOSE 122 (H) 11/17/2020 0851   GLUCOSE 107 (H) 11/14/2020 0151   BUN 9 11/17/2020 0851   CREATININE 0.94 11/17/2020 0851   CALCIUM 8.9 11/17/2020 0851   PROT 6.3 11/17/2020 0851   ALBUMIN 4.2 11/17/2020 0851   AST 31 11/17/2020 0851   ALT 30 11/17/2020 0851   ALKPHOS 86 11/17/2020 0851   BILITOT <0.2 11/17/2020 0851   GFRNONAA >60 11/14/2020 0151   GFRAA  86 03/25/2020 1052    Imaging Studies: NM GI Blood Loss  Result Date: 11/13/2020 CLINICAL DATA:  Bright red blood per rectum. EXAM: NUCLEAR MEDICINE GASTROINTESTINAL BLEEDING SCAN TECHNIQUE: Sequential abdominal images were obtained following intravenous administration of Tc-5m labeled red blood cells. RADIOPHARMACEUTICALS:  21.03 mCi Tc-67m pertechnetate in-vitro labeled red cells. COMPARISON:  None FINDINGS: Activity is noted in the region of the transverse colon and splenic flexure which does not show significant movement. Findings consistent with a primary colonic bleed. IMPRESSION: GI bleed localized to the transverse colon and splenic flexure. Electronically Signed   By: Marijo Sanes M.D.   On: 11/13/2020 17:14    Assessment and Plan:   Deborah Jenkins is a 65 y.o. y/o female has been referred for recent lower GI bleeding  Patient has not had endoscopic visualization of her colon family 7 years.  Patient's recent lower GI bleed was likely due to diverticulosis, however, without any recent visualization of the colon, other etiologies cannot be ruled out without a colonoscopy.  Alternative options of conservative management were discussed in detail, including but not limited to medication management, foregoing endoscopic procedures at this time and others.    After discussing risks and benefits and all options in detail, patient would like to proceed  with colonoscopy for further evaluation  She is also reporting dysphagia, has never had an upper endoscopy.  Patient interested in upper endoscopy at this time for the same  I have discussed alternative options, risks & benefits,  which include, but are not limited to, bleeding, infection, perforation,respiratory complication & drug reaction.  The patient agrees with this plan & written consent will be obtained.    High-fiber diet recommended  Given her ongoing fatigue, will check CBC and iron labs and if low, refer to hematology as well as patient reports intolerance to oral iron previously  Patient advised to chew food well.  Cut into small bites.  Follow with sips of water.    Dr Deborah Jenkins  Speech recognition software was used to dictate the above note.

## 2020-12-04 LAB — CBC
Hematocrit: 31.2 % — ABNORMAL LOW (ref 34.0–46.6)
Hemoglobin: 9.6 g/dL — ABNORMAL LOW (ref 11.1–15.9)
MCH: 25.1 pg — ABNORMAL LOW (ref 26.6–33.0)
MCHC: 30.8 g/dL — ABNORMAL LOW (ref 31.5–35.7)
MCV: 82 fL (ref 79–97)
Platelets: 454 10*3/uL — ABNORMAL HIGH (ref 150–450)
RBC: 3.83 x10E6/uL (ref 3.77–5.28)
RDW: 14.1 % (ref 11.7–15.4)
WBC: 7.4 10*3/uL (ref 3.4–10.8)

## 2020-12-04 LAB — IRON AND TIBC
Iron Saturation: 5 % — CL (ref 15–55)
Iron: 22 ug/dL — ABNORMAL LOW (ref 27–139)
Total Iron Binding Capacity: 415 ug/dL (ref 250–450)
UIBC: 393 ug/dL — ABNORMAL HIGH (ref 118–369)

## 2020-12-04 LAB — FERRITIN: Ferritin: 9 ng/mL — ABNORMAL LOW (ref 15–150)

## 2020-12-07 ENCOUNTER — Other Ambulatory Visit: Payer: Self-pay

## 2020-12-07 ENCOUNTER — Other Ambulatory Visit
Admission: RE | Admit: 2020-12-07 | Discharge: 2020-12-07 | Disposition: A | Discharge status: Home / Self Care | Payer: 59 | Source: Ambulatory Visit | Attending: Gastroenterology | Admitting: Gastroenterology

## 2020-12-07 DIAGNOSIS — Z01812 Encounter for preprocedural laboratory examination: Secondary | ICD-10-CM | POA: Diagnosis not present

## 2020-12-07 DIAGNOSIS — Z20822 Contact with and (suspected) exposure to covid-19: Secondary | ICD-10-CM | POA: Insufficient documentation

## 2020-12-07 LAB — SARS CORONAVIRUS 2 (TAT 6-24 HRS): SARS Coronavirus 2: NEGATIVE

## 2020-12-07 MED FILL — Sod Sulfate-Pot Sulf-Mg Sulf Oral Sol 17.5-3.13-1.6 GM/177ML: ORAL | 1 days supply | Qty: 354 | Fill #0 | Status: AC

## 2020-12-08 ENCOUNTER — Encounter: Payer: Self-pay | Admitting: Gastroenterology

## 2020-12-09 ENCOUNTER — Encounter: Payer: Self-pay | Admitting: Gastroenterology

## 2020-12-09 ENCOUNTER — Ambulatory Visit: Payer: 59 | Admitting: Certified Registered"

## 2020-12-09 ENCOUNTER — Encounter
Admission: RE | Disposition: A | Discharge status: Home / Self Care | Payer: Self-pay | Source: Ambulatory Visit | Attending: Gastroenterology

## 2020-12-09 ENCOUNTER — Other Ambulatory Visit: Payer: Self-pay

## 2020-12-09 ENCOUNTER — Ambulatory Visit
Admission: RE | Admit: 2020-12-09 | Discharge: 2020-12-09 | Disposition: A | Discharge status: Home / Self Care | Payer: 59 | Source: Ambulatory Visit | Attending: Gastroenterology | Admitting: Gastroenterology

## 2020-12-09 DIAGNOSIS — F17210 Nicotine dependence, cigarettes, uncomplicated: Secondary | ICD-10-CM | POA: Insufficient documentation

## 2020-12-09 DIAGNOSIS — Z1211 Encounter for screening for malignant neoplasm of colon: Secondary | ICD-10-CM | POA: Diagnosis not present

## 2020-12-09 DIAGNOSIS — Z888 Allergy status to other drugs, medicaments and biological substances status: Secondary | ICD-10-CM | POA: Insufficient documentation

## 2020-12-09 DIAGNOSIS — R1319 Other dysphagia: Secondary | ICD-10-CM

## 2020-12-09 DIAGNOSIS — K3189 Other diseases of stomach and duodenum: Secondary | ICD-10-CM

## 2020-12-09 DIAGNOSIS — D1779 Benign lipomatous neoplasm of other sites: Secondary | ICD-10-CM | POA: Diagnosis not present

## 2020-12-09 DIAGNOSIS — K635 Polyp of colon: Secondary | ICD-10-CM | POA: Diagnosis not present

## 2020-12-09 DIAGNOSIS — K317 Polyp of stomach and duodenum: Secondary | ICD-10-CM | POA: Diagnosis not present

## 2020-12-09 DIAGNOSIS — K449 Diaphragmatic hernia without obstruction or gangrene: Secondary | ICD-10-CM | POA: Diagnosis not present

## 2020-12-09 DIAGNOSIS — R1310 Dysphagia, unspecified: Secondary | ICD-10-CM | POA: Diagnosis not present

## 2020-12-09 DIAGNOSIS — K2940 Chronic atrophic gastritis without bleeding: Secondary | ICD-10-CM

## 2020-12-09 DIAGNOSIS — Z79899 Other long term (current) drug therapy: Secondary | ICD-10-CM | POA: Insufficient documentation

## 2020-12-09 DIAGNOSIS — D179 Benign lipomatous neoplasm, unspecified: Secondary | ICD-10-CM | POA: Diagnosis not present

## 2020-12-09 DIAGNOSIS — K2289 Other specified disease of esophagus: Secondary | ICD-10-CM | POA: Insufficient documentation

## 2020-12-09 DIAGNOSIS — K5730 Diverticulosis of large intestine without perforation or abscess without bleeding: Secondary | ICD-10-CM | POA: Diagnosis not present

## 2020-12-09 DIAGNOSIS — K648 Other hemorrhoids: Secondary | ICD-10-CM | POA: Insufficient documentation

## 2020-12-09 DIAGNOSIS — K5790 Diverticulosis of intestine, part unspecified, without perforation or abscess without bleeding: Secondary | ICD-10-CM | POA: Diagnosis not present

## 2020-12-09 HISTORY — PX: COLONOSCOPY WITH PROPOFOL: SHX5780

## 2020-12-09 HISTORY — PX: ESOPHAGOGASTRODUODENOSCOPY (EGD) WITH PROPOFOL: SHX5813

## 2020-12-09 SURGERY — ESOPHAGOGASTRODUODENOSCOPY (EGD) WITH PROPOFOL
Anesthesia: General

## 2020-12-09 MED ORDER — ALBUTEROL SULFATE HFA 108 (90 BASE) MCG/ACT IN AERS
INHALATION_SPRAY | RESPIRATORY_TRACT | Status: DC | PRN
  Administered 2020-12-09: 2 via RESPIRATORY_TRACT

## 2020-12-09 MED ORDER — PHENYLEPHRINE HCL (PRESSORS) 10 MG/ML IV SOLN
INTRAVENOUS | Status: DC | PRN
  Administered 2020-12-09: 100 ug via INTRAVENOUS

## 2020-12-09 MED ORDER — GLYCOPYRROLATE 0.2 MG/ML IJ SOLN
INTRAMUSCULAR | Status: DC | PRN
  Administered 2020-12-09: .2 mg via INTRAVENOUS

## 2020-12-09 MED ORDER — PROPOFOL 10 MG/ML IV BOLUS
INTRAVENOUS | Status: DC | PRN
  Administered 2020-12-09: 10 mg via INTRAVENOUS
  Administered 2020-12-09: 20 mg via INTRAVENOUS
  Administered 2020-12-09: 50 mg via INTRAVENOUS
  Administered 2020-12-09: 20 mg via INTRAVENOUS

## 2020-12-09 MED ORDER — LIDOCAINE HCL (CARDIAC) PF 100 MG/5ML IV SOSY
PREFILLED_SYRINGE | INTRAVENOUS | Status: DC | PRN
  Administered 2020-12-09: 20 mg via INTRAVENOUS
  Administered 2020-12-09: 80 mg via INTRAVENOUS

## 2020-12-09 MED ORDER — PROPOFOL 500 MG/50ML IV EMUL
INTRAVENOUS | Status: DC | PRN
  Administered 2020-12-09: 150 ug/kg/min via INTRAVENOUS

## 2020-12-09 MED ORDER — SODIUM CHLORIDE 0.9 % IV SOLN
INTRAVENOUS | Status: DC
Start: 2020-12-09 — End: 2020-12-09
  Administered 2020-12-09: 1000 mL via INTRAVENOUS

## 2020-12-09 NOTE — Transfer of Care (Signed)
Immediate Anesthesia Transfer of Care Note  Patient: Deborah Jenkins  Procedure(s) Performed: ESOPHAGOGASTRODUODENOSCOPY (EGD) WITH PROPOFOL (N/A ) COLONOSCOPY WITH PROPOFOL (N/A )  Patient Location: Endoscopy Unit  Anesthesia Type:General  Level of Consciousness: drowsy and patient cooperative  Airway & Oxygen Therapy: Patient Spontanous Breathing and Patient connected to face mask oxygen  Post-op Assessment: Report given to RN and Post -op Vital signs reviewed and stable  Post vital signs: Reviewed and stable  Last Vitals:  Vitals Value Taken Time  BP 95/66 12/09/20 1003  Temp    Pulse 91 12/09/20 1003  Resp 17 12/09/20 1003  SpO2 96 % 12/09/20 1003  Vitals shown include unvalidated device data.  Last Pain:  Vitals:   12/09/20 0844  TempSrc: Temporal  PainSc: 0-No pain         Complications: No complications documented.

## 2020-12-09 NOTE — H&P (Signed)
Vonda Antigua, MD [address] [place], [place], [place], [address] [address], [place], [place], [place], [address] Phone: [phone]  Fax: [phone]  Primary Care Physician:  Virginia Crews, MD   Pre-Procedure History & Physical: HPI:  Deborah Jenkins is a 65 y.o. female is here for a colonoscopy and EGD.   Past Medical History:  Diagnosis Date  . Depression 2006  . GERD (gastroesophageal reflux disease) 2006    Past Surgical History:  Procedure Laterality Date  . FRACTURE SURGERY    . TONSILLECTOMY Bilateral Childhood  . TUBAL LIGATION    . WRIST FRACTURE SURGERY Right    in childhood    Prior to Admission medications   Medication Sig Start Date End Date Taking? Authorizing Provider  cholecalciferol (VITAMIN D3) 25 MCG (1000 UNIT) tablet Take 1,000 Units by mouth daily.   Yes [provider]  FLUoxetine (PROZAC) 20 MG capsule TAKE 2 CAPSULES BY MOUTH DAILY. Patient taking differently: Take 20 mg by mouth at bedtime. 03/25/20 03/25/21 Yes Pollak, Wendee Beavers, PA-C  fluticasone (FLONASE) 50 MCG/ACT nasal spray Place 1 spray into both nostrils daily as needed for allergies or rhinitis.   Yes [provider]  hydrocortisone cream 0.5 % Apply 1 application topically daily as needed for itching (on elbows).   Yes [provider]  Magnesium Oxide (MAG-CAPS PO) Take 1 tablet by mouth daily.   Yes [provider]  Melatonin 10 MG TABS Take 10 mg by mouth at bedtime.   Yes [provider]  Na Sulfate-K Sulfate-Mg Sulf 17.5-3.13-1.6 GM/177ML SOLN AT 5 PM THE DAY BEFORE PROCEDURE TAKE 1 BOTTLE AND 5 HOURS BEFORE PROCEDURE TAKE 1 BOTTLE. 12/03/20 12/03/21 Yes Vonda Antigua B, MD  omeprazole (PRILOSEC) 20 MG capsule TAKE 1 CAPSULE BY MOUTH ONCE A DAY 10/07/20 10/07/21 Yes Bacigalupo, Dionne Bucy, MD  rosuvastatin (CRESTOR) 10 MG tablet Take 1 tablet (10 mg total) by mouth daily. Patient taking differently: Take 10 mg by mouth at  bedtime. 05/18/20  Yes Trinna Post, PA-C  albuterol (VENTOLIN HFA) 108 (90 Base) MCG/ACT inhaler Inhale 2 puffs into the lungs every 4 (four) hours as needed for wheezing or shortness of breath. 04/27/20   Sharion Balloon, NP    Allergies as of 12/03/2020 - Review Complete 12/03/2020  Allergen Reaction Noted  . Atorvastatin  04/06/2018    Family History  Problem Relation Age of Onset  . Hypertension Mother   . Diverticulosis Mother   . Healthy Father   . Hypertension Sister   . Healthy Sister   . Healthy Brother   . Healthy Sister   . Alcohol abuse Brother   . Alcohol abuse Brother   . Cirrhosis Brother   . Cirrhosis Brother   . Colon cancer Neg Hx   . Breast cancer Neg Hx   . Ovarian cancer Neg Hx   . Cervical cancer Neg Hx     Social History   Socioeconomic History  . Marital status: Single    Spouse name: Not on file  . Number of children: 2  . Years of education: 38  . Highest education level: Associate degree: academic program  Occupational History  . Occupation: Ship broker: [employer]  Tobacco Use  . Smoking status: Current Every Day Smoker    Packs/day: 0.30    Years: 42.00    Pack years: 12.60    Types: Cigarettes  . Smokeless tobacco: Never  Used  . Tobacco comment: started smoking at 65 YO; is smoking 1/3 PPD  Vaping Use  . Vaping Use: Never used  Substance and Sexual Activity  . Alcohol use: No    Alcohol/week: 0.0 standard drinks  . Drug use: No  . Sexual activity: Yes    Birth control/protection: Post-menopausal  Other Topics Concern  . Not on file  Social History Narrative      Social Determinants of Health   Financial Resource Strain: Not on file  Food Insecurity: Not on file  Transportation Needs: Not on file  Physical Activity: Not on file  Stress: Not on file  Social Connections: Not on file  Intimate Partner Violence: Not on file    Review of Systems: See HPI, otherwise negative  ROS  Physical Exam: BP (!) 122/92   Pulse 89   Temp 97.9 F (36.6 C) (Temporal)   Resp 20   Ht 5\' 7"  (1.702 m)   Wt 100.4 kg   SpO2 99%   BMI 34.68 kg/m  General:   Alert,  pleasant and cooperative in NAD Head:  Normocephalic and atraumatic. Neck:  Supple; no masses or thyromegaly. Lungs:  Clear throughout to auscultation, normal respiratory effort.    Heart:  +S1, +S2, Regular rate and rhythm, No edema. Abdomen:  Soft, nontender and nondistended. Normal bowel sounds, without guarding, and without rebound.   Neurologic:  Alert and  oriented x4;  grossly normal neurologically.  Impression/Plan: Deborah Jenkins is here for a colonoscopy to be performed for average risk screening and EGD for dysphagia  Risks, benefits, limitations, and alternatives regarding the procedures have been reviewed with the patient.  Questions have been answered.  All parties agreeable.   Virgel Manifold, MD  12/09/2020, 9:15 AM

## 2020-12-09 NOTE — Anesthesia Postprocedure Evaluation (Signed)
Anesthesia Post Note  Patient: Deborah Jenkins  Procedure(s) Performed: ESOPHAGOGASTRODUODENOSCOPY (EGD) WITH PROPOFOL (N/A ) COLONOSCOPY WITH PROPOFOL (N/A )  Patient location during evaluation: Endoscopy Anesthesia Type: General Level of consciousness: awake and alert Pain management: pain level controlled Vital Signs Assessment: post-procedure vital signs reviewed and stable Respiratory status: spontaneous breathing and respiratory function stable Cardiovascular status: stable Anesthetic complications: no   No complications documented.   Last Vitals:  Vitals:   12/09/20 1003 12/09/20 1013  BP: 95/66 (!) 109/92  Pulse: 91 88  Resp: 17 17  Temp: 36.4 C   SpO2: 96% 97%    Last Pain:  Vitals:   12/09/20 1013  TempSrc:   PainSc: 0-No pain                 Haley Fuerstenberg K

## 2020-12-09 NOTE — Anesthesia Procedure Notes (Signed)
Procedure Name: General with mask airway Performed by: Fletcher-Harrison, Nataliyah Packham, CRNA Pre-anesthesia Checklist: Patient identified, Emergency Drugs available, Suction available and Patient being monitored Patient Re-evaluated:Patient Re-evaluated prior to induction Oxygen Delivery Method: Simple face mask Induction Type: IV induction Placement Confirmation: positive ETCO2 and CO2 detector Dental Injury: Teeth and Oropharynx as per pre-operative assessment        

## 2020-12-09 NOTE — Op Note (Signed)
[facility] Gastroenterology Patient Name: Deborah Jenkins Procedure Date: 12/09/2020 9:11 AM MRN: [id number] Account #: [id number] Date of Birth: 19-Apr-1956 Admit Type: Outpatient Age: 65 Room: [facility] ENDO ROOM 3 Gender: Female Note Status: Finalized Procedure:             Upper GI endoscopy Indications:           Dysphagia Providers:             Crayton Savarese B. Maximino Greenland MD, MD Referring MD:          Marzella Schlein. Bacigalupo (Referring MD) Medicines:             Monitored Anesthesia Care Complications:         No immediate complications. Procedure:             Pre-Anesthesia Assessment:                        - The risks and benefits of the procedure and the                         sedation options and risks were discussed with the                         patient. All questions were answered and informed                         consent was obtained.                        - Patient identification and proposed procedure were                         verified prior to the procedure.                        - ASA Grade Assessment: II - A patient with mild                         systemic disease.                        After obtaining informed consent, the endoscope was                         passed under direct vision. Throughout the procedure,                         the patient's blood pressure, pulse, and oxygen                         saturations were monitored continuously. The Endoscope                         was introduced through the mouth, and advanced to the                         second part of duodenum. The upper GI endoscopy was                         accomplished with ease. The  patient tolerated the                         procedure well. Findings:      The Z-line was irregular. As per guidelines salmon colored mucosa 1 cm       above the GE Junction should be biopsied. This was      not the case here, and the Z-line was just irregular and salmon colored        mucosa was      not 1 cm or more in length and thus biopsies are not indicated.      There is no endoscopic evidence of stenosis or stricture in the entire       esophagus. Biopsies were obtained from the proximal and distal esophagus       with cold forceps for histology of suspected eosinophilic esophagitis.      Patchy atrophic mucosa was found in the gastric antrum. Biopsies were       obtained in the gastric body, at the incisura and in the gastric antrum       with cold forceps for histology.      A single 5 mm mucosal papule (nodule) with no bleeding and no stigmata       of recent bleeding was found in the cardia. Biopsies were taken with a       cold forceps for histology.      A small hiatal hernia was present.      The exam of the stomach was otherwise normal.      The examined duodenum was normal. Impression:            - Z-line irregular.                        - Gastric mucosal atrophy.                        - A single mucosal papule (nodule) found in the                         stomach. Biopsied.                        - Small hiatal hernia.                        - Normal examined duodenum.                        - Biopsies were obtained in the gastric body, at the                         incisura and in the gastric antrum. Recommendation:        - Await pathology results.                        - Discharge patient to home.                        - Continue present medications.                        - Resume previous diet.                        -  The findings and recommendations were discussed with                         the patient.                        - The findings and recommendations were discussed with                         the patient's family.                        - Return to primary care physician as previously                         scheduled.                        - Follow an antireflux regimen. Procedure Code(s):     --- Professional ---                         [id number], Esophagogastroduodenoscopy, flexible,                         transoral; with biopsy, single or multiple Diagnosis Code(s):     --- Professional ---                        K22.8, Other specified diseases of esophagus                        K31.89, Other diseases of stomach and duodenum                        K44.9, Diaphragmatic hernia without obstruction or                         gangrene                        R13.10, Dysphagia, unspecified CPT copyright 2019 American Medical Association. All rights reserved. The codes documented in this report are preliminary and upon coder review may  be revised to meet current compliance requirements.  Melodie Bouillon, MD Michel Bickers B. Maximino Greenland MD, MD 12/09/2020 9:38:45 AM This report has been signed electronically. Number of Addenda: 0 Note Initiated On: 12/09/2020 9:11 AM Estimated Blood Loss:  Estimated blood loss: none.      [facility], [place]

## 2020-12-09 NOTE — Op Note (Signed)
[facility] Gastroenterology Patient Name: Deborah Jenkins Procedure Date: 12/09/2020 9:10 AM MRN: [id number] Account #: [id number] Date of Birth: October 10, 1955 Admit Type: Outpatient Age: 65 Room: [facility] ENDO ROOM 3 Gender: Female Note Status: Finalized Procedure:             Colonoscopy Indications:           Screening for colorectal malignant neoplasm Providers:             Emaad Nanna B. Maximino Greenland MD, MD Referring MD:          Marzella Schlein. Bacigalupo (Referring MD) Medicines:             Monitored Anesthesia Care Complications:         No immediate complications. Procedure:             Pre-Anesthesia Assessment:                        - ASA Grade Assessment: II - A patient with mild                         systemic disease.                        - Prior to the procedure, a History and Physical was                         performed, and patient medications, allergies and                         sensitivities were reviewed. The patient's tolerance                         of previous anesthesia was reviewed.                        - The risks and benefits of the procedure and the                         sedation options and risks were discussed with the                         patient. All questions were answered and informed                         consent was obtained.                        - Patient identification and proposed procedure were                         verified prior to the procedure by the physician, the                         nurse, the anesthesiologist, the anesthetist and the                         technician. The procedure was verified in the                         procedure room.  After obtaining informed consent, the colonoscope was                         passed under direct vision. Throughout the procedure,                         the patient's blood pressure, pulse, and oxygen                         saturations were  monitored continuously. The                         Colonoscope was introduced through the anus and                         advanced to the the cecum, identified by appendiceal                         orifice and ileocecal valve. The colonoscopy was                         performed with ease. The patient tolerated the                         procedure well. The quality of the bowel preparation                         was good. Findings:      The perianal and digital rectal examinations were normal.      A 4 mm polyp was found in the cecum. The polyp was flat. The polyp was       removed with a cold biopsy forceps. Resection and retrieval were       complete.      There was a medium-sized lipoma, in the ascending colon. Biopsies were       taken with a cold forceps for histology.      Multiple diverticula were found in the sigmoid colon.      The exam was otherwise without abnormality.      The rectum, sigmoid colon, descending colon, transverse colon, ascending       colon and cecum appeared normal.      Non-bleeding internal hemorrhoids were found during endoscopy.      Retroflexion in the rectum was not performed due to Narrow rectum.       Careful frontal view of the rectum was otherwise normal. Impression:            - One 4 mm polyp in the cecum, removed with a cold                         biopsy forceps. Resected and retrieved.                        - Medium-sized lipoma in the ascending colon. Biopsied.                        - Diverticulosis in the sigmoid colon.                        - The examination  was otherwise normal.                        - The rectum, sigmoid colon, descending colon,                         transverse colon, ascending colon and cecum are normal.                        - Non-bleeding internal hemorrhoids. Recommendation:        - Discharge patient to home (with escort).                        - Advance diet as tolerated.                        -  Continue present medications.                        - Await pathology results.                        - Repeat colonoscopy date to be determined after                         pending pathology results are reviewed.                        - The findings and recommendations were discussed with                         the patient.                        - The findings and recommendations were discussed with                         the patient's family.                        - Return to primary care physician as previously                         scheduled.                        - High fiber diet. Procedure Code(s):     --- Professional ---                        [id number], Colonoscopy, flexible; with biopsy, single or                         multiple Diagnosis Code(s):     --- Professional ---                        Z12.11, Encounter for screening for malignant neoplasm                         of colon  K63.5, Polyp of colon                        D17.5, Benign lipomatous neoplasm of intra-abdominal                         organs CPT copyright 2019 American Medical Association. All rights reserved. The codes documented in this report are preliminary and upon coder review may  be revised to meet current compliance requirements.  Melodie Bouillon, MD Michel Bickers B. Maximino Greenland MD, MD 12/09/2020 10:11:21 AM This report has been signed electronically. Number of Addenda: 0 Note Initiated On: 12/09/2020 9:10 AM Scope Withdrawal Time: 0 hours 16 minutes 25 seconds  Total Procedure Duration: 0 hours 19 minutes 7 seconds  Estimated Blood Loss:  Estimated blood loss: none.      [facility]

## 2020-12-09 NOTE — Anesthesia Preprocedure Evaluation (Signed)
Anesthesia Evaluation  Patient identified by MRN, date of birth, ID band Patient awake    Reviewed: Allergy & Precautions, NPO status , Patient's Chart, lab work & pertinent test results  History of Anesthesia Complications Negative for: history of anesthetic complications  Airway Mallampati: III       Dental   Pulmonary neg sleep apnea, neg COPD, Current Smoker and Patient abstained from smoking.,           Cardiovascular (-) hypertension(-) Past MI and (-) CHF (-) dysrhythmias (-) Valvular Problems/Murmurs     Neuro/Psych neg Seizures Anxiety Depression    GI/Hepatic Neg liver ROS, GERD  Medicated and Controlled,  Endo/Other  neg diabetes  Renal/GU negative Renal ROS     Musculoskeletal   Abdominal   Peds  Hematology   Anesthesia Other Findings   Reproductive/Obstetrics                             Anesthesia Physical Anesthesia Plan  ASA: II  Anesthesia Plan: General   Post-op Pain Management:    Induction: Intravenous  PONV Risk Score and Plan: 2 and Propofol infusion and TIVA  Airway Management Planned: Nasal Cannula  Additional Equipment:   Intra-op Plan:   Post-operative Plan:   Informed Consent: I have reviewed the patients History and Physical, chart, labs and discussed the procedure including the risks, benefits and alternatives for the proposed anesthesia with the patient or authorized representative who has indicated his/her understanding and acceptance.       Plan Discussed with:   Anesthesia Plan Comments:         Anesthesia Quick Evaluation

## 2020-12-10 ENCOUNTER — Encounter: Payer: Self-pay | Admitting: Gastroenterology

## 2020-12-10 LAB — SURGICAL PATHOLOGY

## 2020-12-11 ENCOUNTER — Encounter: Payer: Self-pay | Admitting: Gastroenterology

## 2020-12-16 ENCOUNTER — Telehealth: Payer: Self-pay

## 2020-12-16 DIAGNOSIS — D649 Anemia, unspecified: Secondary | ICD-10-CM

## 2020-12-16 NOTE — Telephone Encounter (Signed)
-----   Message from Virgel Manifold, MD sent at 12/04/2020 12:44 PM EDT ----- Herb Grays please let the patient know, her iron levels are quite low. I would recommend hematology referral to Dr. Janese Banks. Proceed with EGD and colonoscopy as planned

## 2020-12-16 NOTE — Telephone Encounter (Signed)
Called patient to let her know that she had low iron and that she recommended for her to see a hematologist. Patient didn't answer my call but I left her a voicemail letting her know that I would be sending a referral for her low iron and

## 2020-12-17 ENCOUNTER — Other Ambulatory Visit: Payer: Self-pay | Admitting: *Deleted

## 2020-12-17 NOTE — Patient Outreach (Signed)
[facility]) Care Management  12/17/2020  RIDDHI GRETHER [birth year] [id number]   Transition of care /Case Closure Unsuccessful outreach/follow up    Referral received:11/16/20 Initial outreach:11/16/20 Insurance: [employer]   Subjective: Unsuccessful follow up call to patient,,no answer able to leave a HIPAA compliant voicemail message for return call  Objective[facility] [place] [facility] bleed Diverticular hemorrhage,Anemia transferred from [facility] on 3/11.Comorbidities include: Hyperlipidemia, Depression, Smoker  Shewas discharged to home on 3/13/22without the need for home health servicesor DME.   Marland Kitchen   Plan Case closed to [facility] care management services , unsuccessful follow up TOC call as agreed.  Joylene Draft, RN, BSN  [facility] Management Coordinator  [phone]- Mobile [phone]- Toll Free Main Office

## 2020-12-22 ENCOUNTER — Telehealth: Payer: Self-pay

## 2020-12-22 NOTE — Telephone Encounter (Signed)
Copied from [place] [id number]. Topic: Appointment Scheduling - Scheduling Inquiry for Clinic >> Dec 22, 2020  2:42 PM Erick Blinks wrote: Reason for CRM: Pt called to follow up on iron infusion treatment plan discussed with Carles Collet, pt called and is requesting a call back from office. Please advise  [phone]

## 2020-12-23 ENCOUNTER — Telehealth: Payer: Self-pay

## 2020-12-23 NOTE — Telephone Encounter (Signed)
Copied from [place] [id number]. Topic: General - Other >> Dec 22, 2020  2:44 PM Deborah Jenkins wrote: Reason for CRM: Pt also states that she was told to come in around this time (1 month follow up) for lab work

## 2020-12-23 NOTE — Telephone Encounter (Signed)
Patient has already had follow up labs, been referred to GI and also referred to hematology. Please advised further?

## 2020-12-28 ENCOUNTER — Encounter: Payer: Self-pay | Admitting: Family Medicine

## 2020-12-29 NOTE — Telephone Encounter (Signed)
We can repeat CBC and iron panel, but need to check on hematology referral that looks like it still pending.  We may need to put in a repeat referral to see if its process better from [place] than it is from GI

## 2020-12-30 ENCOUNTER — Other Ambulatory Visit: Payer: Self-pay | Admitting: Family Medicine

## 2020-12-30 DIAGNOSIS — D62 Acute posthemorrhagic anemia: Secondary | ICD-10-CM | POA: Diagnosis not present

## 2020-12-31 ENCOUNTER — Other Ambulatory Visit: Payer: Self-pay

## 2020-12-31 DIAGNOSIS — D649 Anemia, unspecified: Secondary | ICD-10-CM

## 2020-12-31 LAB — CBC WITH DIFFERENTIAL/PLATELET
Basophils Absolute: 0.1 10*3/uL (ref 0.0–0.2)
Basos: 1 %
EOS (ABSOLUTE): 0.1 10*3/uL (ref 0.0–0.4)
Eos: 2 %
Hematocrit: 32.2 % — ABNORMAL LOW (ref 34.0–46.6)
Hemoglobin: 9.7 g/dL — ABNORMAL LOW (ref 11.1–15.9)
Immature Grans (Abs): 0 10*3/uL (ref 0.0–0.1)
Immature Granulocytes: 0 %
Lymphocytes Absolute: 2.6 10*3/uL (ref 0.7–3.1)
Lymphs: 36 %
MCH: 24.4 pg — ABNORMAL LOW (ref 26.6–33.0)
MCHC: 30.1 g/dL — ABNORMAL LOW (ref 31.5–35.7)
MCV: 81 fL (ref 79–97)
Monocytes Absolute: 0.7 10*3/uL (ref 0.1–0.9)
Monocytes: 10 %
Neutrophils Absolute: 3.7 10*3/uL (ref 1.4–7.0)
Neutrophils: 51 %
Platelets: 443 10*3/uL (ref 150–450)
RBC: 3.98 x10E6/uL (ref 3.77–5.28)
RDW: 14.3 % (ref 11.7–15.4)
WBC: 7.2 10*3/uL (ref 3.4–10.8)

## 2020-12-31 LAB — IRON,TIBC AND FERRITIN PANEL
Ferritin: 15 ng/mL (ref 15–150)
Iron Saturation: 8 % — CL (ref 15–55)
Iron: 33 ug/dL (ref 27–139)
Total Iron Binding Capacity: 391 ug/dL (ref 250–450)
UIBC: 358 ug/dL (ref 118–369)

## 2021-01-04 ENCOUNTER — Encounter: Payer: Self-pay | Admitting: Oncology

## 2021-01-04 ENCOUNTER — Inpatient Hospital Stay: Payer: 59 | Attending: Oncology | Admitting: Oncology

## 2021-01-04 ENCOUNTER — Inpatient Hospital Stay: Payer: 59

## 2021-01-04 VITALS — BP 103/77 | HR 90 | Temp 97.9°F | Resp 18 | Ht 67.0 in | Wt 221.6 lb

## 2021-01-04 DIAGNOSIS — L659 Nonscarring hair loss, unspecified: Secondary | ICD-10-CM

## 2021-01-04 DIAGNOSIS — D509 Iron deficiency anemia, unspecified: Secondary | ICD-10-CM | POA: Insufficient documentation

## 2021-01-04 HISTORY — DX: Iron deficiency anemia, unspecified: D50.9

## 2021-01-04 LAB — T4, FREE: Free T4: 1.01 ng/dL (ref 0.61–1.12)

## 2021-01-04 LAB — TSH: TSH: 1.725 u[IU]/mL (ref 0.350–4.500)

## 2021-01-04 NOTE — Progress Notes (Signed)
Hematology/Oncology Consult note [facility] Telephone:([phone] [phone]   Patient Care Team: Virginia Crews, MD as PCP - General (Family Medicine)  REFERRING PROVIDER: Virginia Crews, MD CHIEF COMPLAINTS/REASON FOR VISIT:  Evaluation of iron deficiency anemia  HISTORY OF PRESENTING ILLNESS:  [name] is a  65 y.o.  female with PMH listed below was seen in consultation at the request of Virginia Crews, MD  for evaluation of iron deficiency anemia.   Reviewed patient's recent labs  12/30/2020 Labs revealed anemia with hemoglobin of 9.7, mcv 81  .   Reviewed patient's previous labs ordered by primary care physician's office, anemia is new since March 2022.  March 2022 she was admitted initially to [facility] for lower GI bleeding due to diverticulosis.  Patient was transfused with PRBC during admission and a nuclear medicine scan was performed.  A bleeding spot at the transverse colon and the splenic flexure was localized.  Patient was transferred to [facility].  Patient was recommended to proceed with conversive management and active bleeding resolved.  Patient was was discharged home.  And follow-up with gastroenterologist Dr.Tahiliani 12/09/2020 upper EGD showed gastric mucosal atrophy, single mucosal papule found in the stomach, biopsied small hiatal hernia, normal examined duodenum.  Biopsies were obtained in the gastric body, incisura and gastric antrum-pathology negative for malignancy or dysplasia.  Colonoscopy showed 4 mm polyp in the cecum.  Removed in retrieved-pathology showed benign colonic mucosa with reactive lymphoid tissue..  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  Biopsied.  Pathology showed benign colonic mucosa with submucosal adipose tissue consistent with lipoma.  Diverticulosis in the sigmoid colon.  Otherwise normal.  Nonbleeding internal hemorrhoids.  Patient is postmenopausal.  She denies any additional  episodes of rectal bleeding. Reports feeling very tired.  She craves for ice chips and cornstarch.  Shortness of breath with exertion Also reports hair loss.     Review of Systems  Constitutional: Positive for fatigue. Negative for appetite change, chills and fever.  HENT:   Negative for hearing loss and voice change.   Eyes: Negative for eye problems.  Respiratory: Negative for chest tightness and cough.   Cardiovascular: Negative for chest pain.  Gastrointestinal: Negative for abdominal distention, abdominal pain and blood in stool.  Endocrine: Negative for hot flashes.  Genitourinary: Negative for difficulty urinating and frequency.   Musculoskeletal: Negative for arthralgias.  Skin: Negative for itching and rash.       Hair loss  Neurological: Negative for extremity weakness.  Hematological: Negative for adenopathy.  Psychiatric/Behavioral: Negative for confusion.    MEDICAL HISTORY:  Past Medical History:  Diagnosis Date  . Depression 2006  . GERD (gastroesophageal reflux disease) 2006    SURGICAL HISTORY: Past Surgical History:  Procedure Laterality Date  . COLONOSCOPY WITH PROPOFOL N/A 12/09/2020   Procedure: COLONOSCOPY WITH PROPOFOL;  Surgeon: Virgel Manifold, MD;  Location: [facility] ENDOSCOPY;  Service: Endoscopy;  Laterality: N/A;  . ESOPHAGOGASTRODUODENOSCOPY (EGD) WITH PROPOFOL N/A 12/09/2020   Procedure: ESOPHAGOGASTRODUODENOSCOPY (EGD) WITH PROPOFOL;  Surgeon: Virgel Manifold, MD;  Location: [facility] ENDOSCOPY;  Service: Endoscopy;  Laterality: N/A;  . FRACTURE SURGERY    . TONSILLECTOMY Bilateral Childhood  . TUBAL LIGATION    . WRIST FRACTURE SURGERY Right    in childhood    SOCIAL HISTORY: Social History   Socioeconomic History  . Marital status: Single    Spouse name: Not on file  . Number of children: 2  . Years of education:  14  . Highest education level: Associate degree: academic program  Occupational History  . 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 Smoking status: Current Every Day Smoker    Packs/day: 0.30    Years: 20.00    Pack years: 6.00    Types: Cigarettes  . Smokeless tobacco: Never Used  . Tobacco comment: started smoking at 65 YO; is smoking 1/3 PPD  Vaping Use  . Vaping Use: Never used  Substance and Sexual Activity  . Alcohol use: No    Alcohol/week: 0.0 standard drinks  . Drug use: No  . Sexual activity: Yes    Birth control/protection: Post-menopausal  Other Topics Concern  . Not on file  Social History Narrative      Social Determinants of Health   Financial Resource Strain: Not on file  Food Insecurity: Not on file  Transportation Needs: Not on file  Physical Activity: Not on file  Stress: Not on file  Social Connections: Not on file  Intimate Partner Violence: Not on file    FAMILY HISTORY: Family History  Problem Relation Age of Onset  . Hypertension Mother   . Diverticulosis Mother   . Healthy Father   . Hypertension Sister   . Healthy Sister   . Healthy Brother   . Healthy Sister   . Alcohol abuse Brother   . Alcohol abuse Brother   . Cirrhosis Brother   . Cirrhosis Brother   . Colon cancer Neg Hx   . Breast cancer Neg Hx   . Ovarian cancer Neg Hx   . Cervical cancer Neg Hx     ALLERGIES:  is allergic to atorvastatin.  MEDICATIONS:  Current Outpatient Medications  Medication Sig Dispense Refill  . albuterol (VENTOLIN HFA) 108 (90 Base) MCG/ACT inhaler Inhale 2 puffs into the lungs every 4 (four) hours as needed for wheezing or shortness of breath. 18 g 0  . cholecalciferol (VITAMIN D3) 25 MCG (1000 UNIT) tablet Take 1,000 Units by mouth daily.    Marland Kitchen FLUoxetine (PROZAC) 20 MG capsule TAKE 2 CAPSULES BY MOUTH DAILY. (Patient taking differently: Take 20 mg by mouth at bedtime.) 180 capsule 3  . fluticasone (FLONASE) 50 MCG/ACT nasal spray Place 1 spray into both nostrils daily as needed for allergies or rhinitis.    .  hydrocortisone cream 0.5 % Apply 1 application topically daily as needed for itching (on elbows).    . Melatonin 10 MG TABS Take 10 mg by mouth at bedtime.    Marland Kitchen omeprazole (PRILOSEC) 20 MG capsule TAKE 1 CAPSULE BY MOUTH ONCE A DAY 90 capsule 1  . rosuvastatin (CRESTOR) 10 MG tablet Take 1 tablet (10 mg total) by mouth daily. (Patient taking differently: Take 10 mg by mouth at bedtime.) 90 tablet 3   No current facility-administered medications for this visit.     PHYSICAL EXAMINATION: ECOG PERFORMANCE STATUS: 1 - Symptomatic but completely ambulatory Vitals:   01/04/21 1454  BP: 103/77  Pulse: 90  Resp: 18  Temp: 97.9 F (36.6 C)   Filed Weights   01/04/21 1454  Weight: 221 lb 9.6 oz (100.5 kg)    Physical Exam Constitutional:      General: She is not in acute distress. HENT:     Head: Normocephalic and atraumatic.  Eyes:     General: No scleral icterus. Cardiovascular:     Rate and Rhythm: Normal rate and regular rhythm.  Heart sounds: Normal heart sounds.  Pulmonary:     Effort: Pulmonary effort is normal. No respiratory distress.     Breath sounds: No wheezing.  Abdominal:     General: Bowel sounds are normal. There is no distension.     Palpations: Abdomen is soft.  Musculoskeletal:        General: No deformity. Normal range of motion.     Cervical back: Normal range of motion and neck supple.  Skin:    General: Skin is warm and dry.     Findings: No erythema or rash.  Neurological:     Mental Status: She is alert and oriented to person, place, and time. Mental status is at baseline.     Cranial Nerves: No cranial nerve deficit.     Coordination: Coordination normal.  Psychiatric:        Mood and Affect: Mood normal.       CMP Latest Ref Rng & Units 11/17/2020  Glucose 65 - 99 mg/dL 122(H)  BUN 8 - 27 mg/dL 9  Creatinine 0.57 - 1.00 mg/dL 0.94  Sodium 134 - 144 mmol/L 141  Potassium 3.5 - 5.2 mmol/L 4.6  Chloride 96 - 106 mmol/L 104  CO2 20 - 29  mmol/L 21  Calcium 8.7 - 10.3 mg/dL 8.9  Total Protein 6.0 - 8.5 g/dL 6.3  Total Bilirubin 0.0 - 1.2 mg/dL <0.2  Alkaline Phos 44 - 121 IU/L 86  AST 0 - 40 IU/L 31  ALT 0 - 32 IU/L 30   CBC Latest Ref Rng & Units 12/30/2020  WBC 3.4 - 10.8 x10E3/uL 7.2  Hemoglobin 11.1 - 15.9 g/dL 9.7(L)  Hematocrit 34.0 - 46.6 % 32.2(L)  Platelets 150 - 450 x10E3/uL 443     LABORATORY DATA:  I have reviewed the data as listed Lab Results  Component Value Date   WBC 7.2 12/30/2020   HGB 9.7 (L) 12/30/2020   HCT 32.2 (L) 12/30/2020   MCV 81 12/30/2020   PLT 443 12/30/2020   Recent Labs    03/25/20 1052 03/25/20 1052 11/13/20 1022 11/13/20 1912 11/14/20 0151 11/17/20 0851  NA 139  --  135  --  137 141  K 4.7  --  4.4  --  3.9 4.6  CL 104  --  105  --  108 104  CO2 21  --  21*  --  23 21  GLUCOSE 95   < > 105* 159* 107* 122*  BUN 9  --  10  --  6* 9  CREATININE 0.85  --  0.90  --  0.83 0.94  CALCIUM 9.1  --  8.3*  --  8.4* 8.9  GFRNONAA 74  --  >60  --  >60  --   GFRAA 86  --   --   --   --   --   PROT 7.1  --  7.0  --   --  6.3  ALBUMIN 4.1  --  3.7  --   --  4.2  AST 18  --  29  --   --  31  ALT 16  --  25  --   --  30  ALKPHOS 83  --  77  --   --  86  BILITOT 0.2  --  0.7  --   --  <0.2   < > = values in this interval not displayed.   Iron/TIBC/Ferritin/ %Sat    Component Value Date/Time   IRON 33  12/30/2020 1623   TIBC 391 12/30/2020 1623   FERRITIN 15 12/30/2020 1623   IRONPCTSAT 8 (LL) 12/30/2020 1623     RADIOGRAPHIC STUDIES: I have personally reviewed the radiological images as listed and agreed with the findings in the report. No results found.     ASSESSMENT & PLAN:  1. Iron deficiency anemia, unspecified iron deficiency anemia type   2. Hair loss    Labs are reviewed and discussed with patient. Consistent with iron deficiency anemia. Plan IV iron with Venofer 200mg  weekly x 4 doses. Allergy reactions/infusion reaction including anaphylactic reaction  discussed with patient. Other side effects include but not limited to high blood pressure, skin rash, weight gain, leg swelling, etc. Patient voices understanding and willing to proceed.  Hair loss, check TSH and free T4 today  Orders Placed This Encounter  Procedures  . TSH    Standing Status:   Future    Number of Occurrences:   1    Standing Expiration Date:   01/04/2022  . T4, free    Standing Status:   Future    Number of Occurrences:   1    Standing Expiration Date:   01/04/2022    All questions were answered. The patient knows to call the clinic with any problems questions or concerns.  Cc Bacigalupo, Dionne Bucy, MD  Return of visit: 8 weeks Thank you for this kind referral and the opportunity to participate in the care of this patient. A copy of today's note is routed to referring provider   Earlie Server, MD, PhD Hematology Oncology [place] [facility] 01/04/2021

## 2021-01-04 NOTE — Progress Notes (Signed)
Patient here to establish care for anemia 

## 2021-01-07 ENCOUNTER — Other Ambulatory Visit: Payer: Self-pay

## 2021-01-07 MED FILL — Omeprazole Cap Delayed Release 20 MG: ORAL | 90 days supply | Qty: 90 | Fill #0 | Status: AC

## 2021-01-08 ENCOUNTER — Inpatient Hospital Stay: Payer: 59

## 2021-01-08 VITALS — BP 127/85 | HR 72 | Temp 97.3°F | Resp 18

## 2021-01-08 DIAGNOSIS — L659 Nonscarring hair loss, unspecified: Secondary | ICD-10-CM | POA: Diagnosis not present

## 2021-01-08 DIAGNOSIS — D509 Iron deficiency anemia, unspecified: Secondary | ICD-10-CM | POA: Diagnosis not present

## 2021-01-08 MED ORDER — SODIUM CHLORIDE 0.9 % IV SOLN
Freq: Once | INTRAVENOUS | Status: AC
  Filled 2021-01-08: qty 250

## 2021-01-08 MED ORDER — IRON SUCROSE 20 MG/ML IV SOLN
200.0000 mg | Freq: Once | INTRAVENOUS | Status: AC
  Administered 2021-01-08: 200 mg via INTRAVENOUS
  Filled 2021-01-08: qty 10

## 2021-01-08 MED ORDER — SODIUM CHLORIDE 0.9 % IV SOLN: 200.0000 mg | Freq: Once | INTRAVENOUS | Status: DC

## 2021-01-08 NOTE — Patient Instructions (Signed)
[facility] ONCOL[facility]  Discharge Instructions: Thank you for choosing [facility] to provide your oncology and hematology care.  If you have a lab appointment with the [facility], please go directly to the [facility] and check in at the registration area.  Wear comfortable clothing and clothing appropriate for easy access to any Portacath or PICC line.   We strive to give you quality time with your provider. You may need to reschedule your appointment if you arrive late (15 or more minutes).  Arriving late affects you and other patients whose appointments are after yours.  Also, if you miss three or more appointments without notifying the office, you may be dismissed from the clinic at the provider's discretion.      For prescription refill requests, have your pharmacy contact our office and allow 72 hours for refills to be completed.    Today you received the following venfoer   To help prevent nausea and vomiting after your treatment, we encourage you to take your nausea medication as directed.  BELOW ARE SYMPTOMS THAT SHOULD BE REPORTED IMMEDIATELY: . *FEVER GREATER THAN 100.4 F (38 C) OR HIGHER . *CHILLS OR SWEATING . *NAUSEA AND VOMITING THAT IS NOT CONTROLLED WITH YOUR NAUSEA MEDICATION . *UNUSUAL SHORTNESS OF BREATH . *UNUSUAL BRUISING OR BLEEDING . *URINARY PROBLEMS (pain or burning when urinating, or frequent urination) . *BOWEL PROBLEMS (unusual diarrhea, constipation, pain near the anus) . TENDERNESS IN MOUTH AND THROAT WITH OR WITHOUT PRESENCE OF ULCERS (sore throat, sores in mouth, or a toothache) . UNUSUAL RASH, SWELLING OR PAIN  . UNUSUAL VAGINAL DISCHARGE OR ITCHING   Items with * indicate a potential emergency and should be followed up as soon as possible or go to the Emergency Department if any problems should occur.  Please show the CHEMOTHERAPY ALERT CARD or IMMUNOTHERAPY ALERT CARD at check-in to the Emergency Department  and triage nurse.  Should you have questions after your visit or need to cancel or reschedule your appointment, please contact [facility]  [phone] and follow the prompts.  Office hours are 8:00 a.m. to 4:30 p.m. Monday - Friday. Please note that voicemails left after 4:00 p.m. may not be returned until the following business day.  We are closed weekends and major holidays. You have access to a nurse at all times for urgent questions. Please call the main number to the clinic [phone] and follow the prompts.  For any non-urgent questions, you may also contact your provider using MyChart. We now offer e-Visits for anyone 30 and older to request care online for non-urgent symptoms. For details visit [email or web address].   Also download the MyChart app! Go to the app store, search "MyChart", open the app, select [facility], and log in with your MyChart username and password.  Due to Covid, a mask is required upon entering the hospital/clinic. If you do not have a mask, one will be given to you upon arrival. For doctor visits, patients may have 1 support person aged 42 or older with them. For treatment visits, patients cannot have anyone with them due to current Covid guidelines and our immunocompromised population.   Iron Sucrose injection What is this medicine? IRON SUCROSE (AHY ern SOO krohs) is an iron complex. Iron is used to make healthy red blood cells, which carry oxygen and nutrients throughout the body. This medicine is used to treat iron deficiency anemia in people with chronic kidney disease. This medicine may  be used for other purposes; ask your health care provider or pharmacist if you have questions. COMMON BRAND NAME(S): Venofer What should I tell my health care provider before I take this medicine? They need to know if you have any of these conditions:  anemia not caused by low iron levels  heart disease  high levels of iron in the  blood  kidney disease  liver disease  an unusual or allergic reaction to iron, other medicines, foods, dyes, or preservatives  pregnant or trying to get pregnant  breast-feeding How should I use this medicine? This medicine is for infusion into a vein. It is given by a health care professional in a hospital or clinic setting. Talk to your pediatrician regarding the use of this medicine in children. While this drug may be prescribed for children as young as 2 years for selected conditions, precautions do apply. Overdosage: If you think you have taken too much of this medicine contact a poison control center or emergency room at once. NOTE: This medicine is only for you. Do not share this medicine with others. What if I miss a dose? It is important not to miss your dose. Call your doctor or health care professional if you are unable to keep an appointment. What may interact with this medicine? Do not take this medicine with any of the following medications:  deferoxamine  dimercaprol  other iron products This medicine may also interact with the following medications:  chloramphenicol  deferasirox This list may not describe all possible interactions. Give your health care provider a list of all the medicines, herbs, non-prescription drugs, or dietary supplements you use. Also tell them if you smoke, drink alcohol, or use illegal drugs. Some items may interact with your medicine. What should I watch for while using this medicine? Visit your doctor or healthcare professional regularly. Tell your doctor or healthcare professional if your symptoms do not start to get better or if they get worse. You may need blood work done while you are taking this medicine. You may need to follow a special diet. Talk to your doctor. Foods that contain iron include: whole grains/cereals, dried fruits, beans, or peas, leafy green vegetables, and organ meats (liver, kidney). What side effects may I notice  from receiving this medicine? Side effects that you should report to your doctor or health care professional as soon as possible:  allergic reactions like skin rash, itching or hives, swelling of the face, lips, or tongue  breathing problems  changes in blood pressure  cough  fast, irregular heartbeat  feeling faint or lightheaded, falls  fever or chills  flushing, sweating, or hot feelings  joint or muscle aches/pains  seizures  swelling of the ankles or feet  unusually weak or tired Side effects that usually do not require medical attention (report to your doctor or health care professional if they continue or are bothersome):  diarrhea  feeling achy  headache  irritation at site where injected  nausea, vomiting  stomach upset  tiredness This list may not describe all possible side effects. Call your doctor for medical advice about side effects. You may report side effects to FDA at 1-800-FDA-1088. Where should I keep my medicine? This drug is given in a hospital or clinic and will not be stored at home. NOTE: This sheet is a summary. It may not cover all possible information. If you have questions about this medicine, talk to your doctor, pharmacist, or health care provider.  2021 Elsevier/Gold Standard (  2011-06-02 17:14:35)  

## 2021-01-12 ENCOUNTER — Telehealth: Payer: Self-pay

## 2021-01-12 NOTE — Telephone Encounter (Signed)
FMLA form, leave # [id number], completed and faxed to [employer] (F: [phone] P: [phone])

## 2021-01-14 ENCOUNTER — Inpatient Hospital Stay: Payer: 59

## 2021-01-14 VITALS — BP 129/87 | HR 76 | Temp 96.3°F | Resp 18

## 2021-01-14 DIAGNOSIS — D509 Iron deficiency anemia, unspecified: Secondary | ICD-10-CM | POA: Diagnosis not present

## 2021-01-14 DIAGNOSIS — L659 Nonscarring hair loss, unspecified: Secondary | ICD-10-CM | POA: Diagnosis not present

## 2021-01-14 MED ORDER — SODIUM CHLORIDE 0.9 % IV SOLN
Freq: Once | INTRAVENOUS | Status: AC
Start: 2021-01-14 — End: 2021-01-14
  Filled 2021-01-14: qty 250

## 2021-01-14 MED ORDER — SODIUM CHLORIDE 0.9 % IV SOLN: 200.0000 mg | Freq: Once | INTRAVENOUS | Status: DC

## 2021-01-14 MED ORDER — IRON SUCROSE 20 MG/ML IV SOLN
200.0000 mg | Freq: Once | INTRAVENOUS | Status: AC
Start: 2021-01-14 — End: 2021-01-14
  Administered 2021-01-14: 200 mg via INTRAVENOUS
  Filled 2021-01-14: qty 10

## 2021-01-14 NOTE — Patient Instructions (Signed)
[facility] [place] [facility]    Discharge Instructions:  Thank you for choosing [facility] to provide your oncology and hematology care.  If you have a lab appointment with the Cancer Center, please go directly to the Cancer Center and check in at the registration area.  We strive to give you quality time with your provider. You may need to reschedule your appointment if you arrive late (15 or more minutes).  Arriving late affects you and other patients whose appointments are after yours.  Also, if you miss three or more appointments without notifying the office, you may be dismissed from the clinic at the provider's discretion.      For prescription refill requests, have your pharmacy contact our office and allow 72 hours for refills to be completed.    Today you received the following treatment: Venofer.  BELOW ARE SYMPTOMS THAT SHOULD BE REPORTED IMMEDIATELY: . *FEVER GREATER THAN 100.4 F (38 C) OR HIGHER . *CHILLS OR SWEATING . *NAUSEA AND VOMITING THAT IS NOT CONTROLLED WITH YOUR NAUSEA MEDICATION . *UNUSUAL SHORTNESS OF BREATH . *UNUSUAL BRUISING OR BLEEDING . *URINARY PROBLEMS (pain or burning when urinating, or frequent urination) . *BOWEL PROBLEMS (unusual diarrhea, constipation, pain near the anus) . TENDERNESS IN MOUTH AND THROAT WITH OR WITHOUT PRESENCE OF ULCERS (sore throat, sores in mouth, or a toothache) . UNUSUAL RASH, SWELLING OR PAIN  . UNUSUAL VAGINAL DISCHARGE OR ITCHING   Items with * indicate a potential emergency and should be followed up as soon as possible or go to the Emergency Department if any problems should occur.  Should you have questions after your visit or need to cancel or reschedule your appointment, please contact [facility] [place] [facility]  [phone] and follow the prompts.  Office hours are 8:00 a.m. to 4:30 p.m. Monday - Friday. Please note that voicemails left after 4:00 p.m. may not be  returned until the following business day.  We are closed weekends and major holidays. You have access to a nurse at all times for urgent questions. Please call the main number to the clinic [phone] and follow the prompts.  For any non-urgent questions, you may also contact your provider using MyChart. We now offer e-Visits for anyone 18 and older to request care online for non-urgent symptoms. For details visit [email or web address].   Also download the MyChart app! Go to the app store, search "MyChart", open the app, select , and log in with your MyChart username and password.  Due to Covid, a mask is required upon entering the hospital/clinic. If you do not have a mask, one will be given to you upon arrival. For doctor visits, patients may have 1 support person aged 18 or older with them. For treatment visits, patients cannot have anyone with them due to current Covid guidelines and our immunocompromised population.   Iron Sucrose injection  What is this medicine? IRON SUCROSE (AHY ern SOO krohs) is an iron complex. Iron is used to make healthy red blood cells, which carry oxygen and nutrients throughout the body. This medicine is used to treat iron deficiency anemia in people with chronic kidney disease. This medicine may be used for other purposes; ask your health care provider or pharmacist if you have questions. COMMON BRAND NAME(S): Venofer What should I tell my health care provider before I take this medicine? They need to know if you have any of these conditions:  anemia not caused by low iron levels    heart disease  high levels of iron in the blood  kidney disease  liver disease  an unusual or allergic reaction to iron, other medicines, foods, dyes, or preservatives  pregnant or trying to get pregnant  breast-feeding How should I use this medicine? This medicine is for infusion into a vein. It is given by a health care professional in a hospital or clinic  setting. Talk to your pediatrician regarding the use of this medicine in children. While this drug may be prescribed for children as young as 2 years for selected conditions, precautions do apply. Overdosage: If you think you have taken too much of this medicine contact a poison control center or emergency room at once. NOTE: This medicine is only for you. Do not share this medicine with others. What if I miss a dose? It is important not to miss your dose. Call your doctor or health care professional if you are unable to keep an appointment. What may interact with this medicine? Do not take this medicine with any of the following medications:  deferoxamine  dimercaprol  other iron products This medicine may also interact with the following medications:  chloramphenicol  deferasirox This list may not describe all possible interactions. Give your health care provider a list of all the medicines, herbs, non-prescription drugs, or dietary supplements you use. Also tell them if you smoke, drink alcohol, or use illegal drugs. Some items may interact with your medicine. What should I watch for while using this medicine? Visit your doctor or healthcare professional regularly. Tell your doctor or healthcare professional if your symptoms do not start to get better or if they get worse. You may need blood work done while you are taking this medicine. You may need to follow a special diet. Talk to your doctor. Foods that contain iron include: whole grains/cereals, dried fruits, beans, or peas, leafy green vegetables, and organ meats (liver, kidney). What side effects may I notice from receiving this medicine? Side effects that you should report to your doctor or health care professional as soon as possible:  allergic reactions like skin rash, itching or hives, swelling of the face, lips, or tongue  breathing problems  changes in blood pressure  cough  fast, irregular heartbeat  feeling faint  or lightheaded, falls  fever or chills  flushing, sweating, or hot feelings  joint or muscle aches/pains  seizures  swelling of the ankles or feet  unusually weak or tired Side effects that usually do not require medical attention (report to your doctor or health care professional if they continue or are bothersome):  diarrhea  feeling achy  headache  irritation at site where injected  nausea, vomiting  stomach upset  tiredness This list may not describe all possible side effects. Call your doctor for medical advice about side effects. You may report side effects to FDA at 1-800-FDA-1088. Where should I keep my medicine? This drug is given in a hospital or clinic and will not be stored at home. NOTE: This sheet is a summary. It may not cover all possible information. If you have questions about this medicine, talk to your doctor, pharmacist, or health care provider.  2021 Elsevier/Gold Standard (2011-06-02 17:14:35)  

## 2021-01-21 ENCOUNTER — Other Ambulatory Visit: Payer: Self-pay

## 2021-01-21 ENCOUNTER — Inpatient Hospital Stay: Payer: 59

## 2021-01-21 VITALS — BP 128/81 | HR 77 | Temp 97.8°F | Resp 18

## 2021-01-21 DIAGNOSIS — D509 Iron deficiency anemia, unspecified: Secondary | ICD-10-CM

## 2021-01-21 DIAGNOSIS — L659 Nonscarring hair loss, unspecified: Secondary | ICD-10-CM | POA: Diagnosis not present

## 2021-01-21 MED ORDER — SODIUM CHLORIDE 0.9 % IV SOLN
Freq: Once | INTRAVENOUS | Status: AC
  Filled 2021-01-21: qty 250

## 2021-01-21 MED ORDER — SODIUM CHLORIDE 0.9 % IV SOLN: 200.0000 mg | Freq: Once | INTRAVENOUS | Status: DC

## 2021-01-21 MED ORDER — IRON SUCROSE 20 MG/ML IV SOLN
200.0000 mg | Freq: Once | INTRAVENOUS | Status: AC
  Administered 2021-01-21: 200 mg via INTRAVENOUS
  Filled 2021-01-21: qty 10

## 2021-01-21 NOTE — Patient Instructions (Signed)

## 2021-01-28 ENCOUNTER — Other Ambulatory Visit: Payer: Self-pay

## 2021-01-28 ENCOUNTER — Inpatient Hospital Stay: Payer: 59

## 2021-01-28 VITALS — BP 111/79 | HR 75 | Temp 96.2°F

## 2021-01-28 DIAGNOSIS — L659 Nonscarring hair loss, unspecified: Secondary | ICD-10-CM | POA: Diagnosis not present

## 2021-01-28 DIAGNOSIS — D509 Iron deficiency anemia, unspecified: Secondary | ICD-10-CM

## 2021-01-28 MED ORDER — SODIUM CHLORIDE 0.9 % IV SOLN
Freq: Once | INTRAVENOUS | Status: AC
  Filled 2021-01-28: qty 250

## 2021-01-28 MED ORDER — IRON SUCROSE 20 MG/ML IV SOLN
200.0000 mg | Freq: Once | INTRAVENOUS | Status: AC
  Administered 2021-01-28: 200 mg via INTRAVENOUS
  Filled 2021-01-28: qty 10

## 2021-01-28 MED ORDER — SODIUM CHLORIDE 0.9 % IV SOLN: 200.0000 mg | Freq: Once | INTRAVENOUS | Status: DC

## 2021-01-28 NOTE — Patient Instructions (Signed)

## 2021-02-26 ENCOUNTER — Other Ambulatory Visit: Payer: Self-pay

## 2021-02-26 DIAGNOSIS — L650 Telogen effluvium: Secondary | ICD-10-CM | POA: Diagnosis not present

## 2021-02-26 DIAGNOSIS — D509 Iron deficiency anemia, unspecified: Secondary | ICD-10-CM

## 2021-03-01 ENCOUNTER — Other Ambulatory Visit: Payer: Self-pay

## 2021-03-01 ENCOUNTER — Inpatient Hospital Stay: Payer: 59 | Attending: Oncology

## 2021-03-01 DIAGNOSIS — Z79899 Other long term (current) drug therapy: Secondary | ICD-10-CM | POA: Insufficient documentation

## 2021-03-01 DIAGNOSIS — D509 Iron deficiency anemia, unspecified: Secondary | ICD-10-CM | POA: Insufficient documentation

## 2021-03-01 DIAGNOSIS — F17210 Nicotine dependence, cigarettes, uncomplicated: Secondary | ICD-10-CM | POA: Diagnosis not present

## 2021-03-01 LAB — CBC WITH DIFFERENTIAL/PLATELET
Abs Immature Granulocytes: 0.02 10*3/uL (ref 0.00–0.07)
Basophils Absolute: 0 10*3/uL (ref 0.0–0.1)
Basophils Relative: 1 %
Eosinophils Absolute: 0.1 10*3/uL (ref 0.0–0.5)
Eosinophils Relative: 2 %
HCT: 39.9 % (ref 36.0–46.0)
Hemoglobin: 12.3 g/dL (ref 12.0–15.0)
Immature Granulocytes: 0 %
Lymphocytes Relative: 34 %
Lymphs Abs: 2.2 10*3/uL (ref 0.7–4.0)
MCH: 26.6 pg (ref 26.0–34.0)
MCHC: 30.8 g/dL (ref 30.0–36.0)
MCV: 86.2 fL (ref 80.0–100.0)
Monocytes Absolute: 0.7 10*3/uL (ref 0.1–1.0)
Monocytes Relative: 11 %
Neutro Abs: 3.6 10*3/uL (ref 1.7–7.7)
Neutrophils Relative %: 52 %
Platelets: 342 10*3/uL (ref 150–400)
RBC: 4.63 MIL/uL (ref 3.87–5.11)
RDW: 17.2 % — ABNORMAL HIGH (ref 11.5–15.5)
WBC: 6.7 10*3/uL (ref 4.0–10.5)
nRBC: 0 % (ref 0.0–0.2)

## 2021-03-01 LAB — FERRITIN: Ferritin: 62 ng/mL (ref 11–307)

## 2021-03-01 LAB — IRON AND TIBC
Iron: 57 ug/dL (ref 28–170)
Saturation Ratios: 16 % (ref 10.4–31.8)
TIBC: 347 ug/dL (ref 250–450)
UIBC: 290 ug/dL

## 2021-03-03 ENCOUNTER — Inpatient Hospital Stay (HOSPITAL_BASED_OUTPATIENT_CLINIC_OR_DEPARTMENT_OTHER): Payer: 59 | Admitting: Oncology

## 2021-03-03 ENCOUNTER — Inpatient Hospital Stay: Payer: 59

## 2021-03-03 ENCOUNTER — Encounter: Payer: Self-pay | Admitting: Oncology

## 2021-03-03 VITALS — BP 109/80 | HR 80 | Temp 98.4°F | Resp 18 | Wt 224.0 lb

## 2021-03-03 DIAGNOSIS — D509 Iron deficiency anemia, unspecified: Secondary | ICD-10-CM | POA: Diagnosis not present

## 2021-03-03 DIAGNOSIS — R5383 Other fatigue: Secondary | ICD-10-CM | POA: Diagnosis not present

## 2021-03-03 DIAGNOSIS — Z79899 Other long term (current) drug therapy: Secondary | ICD-10-CM | POA: Diagnosis not present

## 2021-03-03 DIAGNOSIS — F17210 Nicotine dependence, cigarettes, uncomplicated: Secondary | ICD-10-CM | POA: Diagnosis not present

## 2021-03-03 NOTE — Progress Notes (Signed)
Hematology/Oncology Consult note [facility] Telephone:([phone] Fax:([phone]   Patient Care Team: Virginia Crews, MD as PCP - General (Family Medicine)  REFERRING PROVIDER: Virginia Crews, MD CHIEF COMPLAINTS/REASON FOR VISIT:  Evaluation of iron deficiency anemia  HISTORY OF PRESENTING ILLNESS:  Deborah Jenkins is a  65 y.o.  female with PMH listed below was seen in consultation at the request of Virginia Crews, MD  for evaluation of iron deficiency anemia.   Reviewed patient's recent labs  12/30/2020 Labs revealed anemia with hemoglobin of 9.7, mcv 81  .   Reviewed patient's previous labs ordered by primary care physician's office, anemia is new since March 2022.  March 2022 she was admitted initially to [facility] for lower GI bleeding due to diverticulosis.  Patient was transfused with PRBC during admission and a nuclear medicine scan was performed.  A bleeding spot at the transverse colon and the splenic flexure was localized.  Patient was transferred to [facility].  Patient was recommended to proceed with conversive management and active bleeding resolved.  Patient was was discharged home.  And follow-up with gastroenterologist Dr.Tahiliani 12/09/2020 upper EGD showed gastric mucosal atrophy, single mucosal papule found in the stomach, biopsied small hiatal hernia, normal examined duodenum.  Biopsies were obtained in the gastric body, incisura and gastric antrum-pathology negative for malignancy or dysplasia.  Colonoscopy showed 4 mm polyp in the cecum.  Removed in retrieved-pathology showed benign colonic mucosa with reactive lymphoid tissue..  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  Biopsied.  Pathology showed benign colonic mucosa with submucosal adipose tissue consistent with lipoma.  Diverticulosis in the sigmoid colon.  Otherwise normal.  Nonbleeding internal hemorrhoids.  Patient is postmenopausal.  She denies any additional  episodes of rectal bleeding. Reports feeling very tired.  She craves for ice chips and cornstarch.  Shortness of breath with exertion Also reports hair loss.  INTERVAL HISTORY Deborah Jenkins is a 65 y.o. female who has above history reviewed by me today presents for follow up visit for management of IDA Problems and complaints are listed below: She reports that her fatigue has improved, but not to her baseline yet.  She has been diagnosed of alopecia.  Her mother passed away in 2022/10/25. She wakes up early in the morning. She takes melatonin to help her sleep at night. She denies feeling depressed.   Review of Systems  Constitutional:  Positive for fatigue. Negative for appetite change, chills and fever.  HENT:   Negative for hearing loss and voice change.   Eyes:  Negative for eye problems.  Respiratory:  Negative for chest tightness and cough.   Cardiovascular:  Negative for chest pain.  Gastrointestinal:  Negative for abdominal distention, abdominal pain and blood in stool.  Endocrine: Negative for hot flashes.  Genitourinary:  Negative for difficulty urinating and frequency.   Musculoskeletal:  Negative for arthralgias.  Skin:  Negative for itching and rash.       Hair loss  Neurological:  Negative for extremity weakness.  Hematological:  Negative for adenopathy.  Psychiatric/Behavioral:  Negative for confusion.    MEDICAL HISTORY:  Past Medical History:  Diagnosis Date   Depression 2006   GERD (gastroesophageal reflux disease) 2006   Iron deficiency anemia 01/04/2021    SURGICAL HISTORY: Past Surgical History:  Procedure Laterality Date   COLONOSCOPY WITH PROPOFOL N/A 12/09/2020   Procedure: COLONOSCOPY WITH PROPOFOL;  Surgeon: Virgel Manifold, MD;  Location: [facility] ENDOSCOPY;  Service: Endoscopy;  Laterality: N/A;   ESOPHAGOGASTRODUODENOSCOPY (EGD) WITH PROPOFOL N/A 12/09/2020   Procedure: ESOPHAGOGASTRODUODENOSCOPY (EGD) WITH PROPOFOL;  Surgeon: Virgel Manifold, MD;   Location: [facility] ENDOSCOPY;  Service: Endoscopy;  Laterality: N/A;   FRACTURE SURGERY     TONSILLECTOMY Bilateral Childhood   TUBAL LIGATION     WRIST FRACTURE SURGERY Right    in childhood    SOCIAL HISTORY: Social History   Socioeconomic History   Marital status: Single    Spouse name: Not on file   Number of children: 2   Years of education: 14   Highest education level: Associate degree: academic program  Occupational History   Occupation: Ship broker: [employer]    Comment: [employer]  Tobacco Use   Smoking status: Every Day    Packs/day: 0.30    Years: 20.00    Pack years: 6.00    Types: Cigarettes   Smokeless tobacco: Never   Tobacco comments:    started smoking at 65 YO; is smoking 1/3 PPD  Vaping Use   Vaping Use: Never used  Substance and Sexual Activity   Alcohol use: No    Alcohol/week: 0.0 standard drinks   Drug use: No   Sexual activity: Yes    Birth control/protection: Post-menopausal  Other Topics Concern   Not on file  Social History Narrative      Social Determinants of Health   Financial Resource Strain: Not on file  Food Insecurity: Not on file  Transportation Needs: Not on file  Physical Activity: Not on file  Stress: Not on file  Social Connections: Not on file  Intimate Partner Violence: Not on file    FAMILY HISTORY: Family History  Problem Relation Age of Onset   Hypertension Mother    Diverticulosis Mother    Healthy Father    Hypertension Sister    Healthy Sister    Healthy Brother    Healthy Sister    Alcohol abuse Brother    Alcohol abuse Brother    Cirrhosis Brother    Cirrhosis Brother    Colon cancer Neg Hx    Breast cancer Neg Hx    Ovarian cancer Neg Hx    Cervical cancer Neg Hx     ALLERGIES:  is allergic to atorvastatin.  MEDICATIONS:  Current Outpatient Medications  Medication Sig Dispense Refill   cholecalciferol (VITAMIN D3) 25 MCG (1000 UNIT) tablet Take 1,000 Units by mouth daily.      FLUoxetine (PROZAC) 20 MG capsule TAKE 2 CAPSULES BY MOUTH DAILY. (Patient taking differently: Take 20 mg by mouth at bedtime.) 180 capsule 3   fluticasone (FLONASE) 50 MCG/ACT nasal spray Place 1 spray into both nostrils daily as needed for allergies or rhinitis.     hydrocortisone cream 0.5 % Apply 1 application topically daily as needed for itching (on elbows).     Melatonin 10 MG TABS Take 10 mg by mouth at bedtime.     NON FORMULARY Viviscal/ For hair loss     omeprazole (PRILOSEC) 20 MG capsule TAKE 1 CAPSULE BY MOUTH ONCE A DAY 90 capsule 1   rosuvastatin (CRESTOR) 10 MG tablet Take 1 tablet (10 mg total) by mouth daily. (Patient taking differently: Take 10 mg by mouth at bedtime.) 90 tablet 3   albuterol (VENTOLIN HFA) 108 (90 Base) MCG/ACT inhaler Inhale 2 puffs into the lungs every 4 (four) hours as needed for wheezing or shortness of breath. (Patient not taking: Reported on 03/03/2021) 18 g 0  No current facility-administered medications for this visit.     PHYSICAL EXAMINATION: ECOG PERFORMANCE STATUS: 1 - Symptomatic but completely ambulatory Vitals:   03/03/21 1357  BP: 109/80  Pulse: 80  Resp: 18  Temp: 98.4 F (36.9 C)  SpO2: 98%   Filed Weights   03/03/21 1357  Weight: 224 lb (101.6 kg)    Physical Exam Constitutional:      General: She is not in acute distress. HENT:     Head: Normocephalic and atraumatic.  Eyes:     General: No scleral icterus. Cardiovascular:     Rate and Rhythm: Normal rate and regular rhythm.     Heart sounds: Normal heart sounds.  Pulmonary:     Effort: Pulmonary effort is normal. No respiratory distress.     Breath sounds: No wheezing.  Abdominal:     General: Bowel sounds are normal. There is no distension.     Palpations: Abdomen is soft.  Musculoskeletal:        General: No deformity. Normal range of motion.     Cervical back: Normal range of motion and neck supple.  Skin:    General: Skin is warm and dry.     Findings: No  erythema or rash.  Neurological:     Mental Status: She is alert and oriented to person, place, and time. Mental status is at baseline.     Cranial Nerves: No cranial nerve deficit.     Coordination: Coordination normal.  Psychiatric:        Mood and Affect: Mood normal.      CMP Latest Ref Rng & Units 11/17/2020  Glucose 65 - 99 mg/dL 122(H)  BUN 8 - 27 mg/dL 9  Creatinine 0.57 - 1.00 mg/dL 0.94  Sodium 134 - 144 mmol/L 141  Potassium 3.5 - 5.2 mmol/L 4.6  Chloride 96 - 106 mmol/L 104  CO2 20 - 29 mmol/L 21  Calcium 8.7 - 10.3 mg/dL 8.9  Total Protein 6.0 - 8.5 g/dL 6.3  Total Bilirubin 0.0 - 1.2 mg/dL <0.2  Alkaline Phos 44 - 121 IU/L 86  AST 0 - 40 IU/L 31  ALT 0 - 32 IU/L 30   CBC Latest Ref Rng & Units 03/01/2021  WBC 4.0 - 10.5 K/uL 6.7  Hemoglobin 12.0 - 15.0 g/dL 12.3  Hematocrit 36.0 - 46.0 % 39.9  Platelets 150 - 400 K/uL 342     LABORATORY DATA:  I have reviewed the data as listed Lab Results  Component Value Date   WBC 6.7 03/01/2021   HGB 12.3 03/01/2021   HCT 39.9 03/01/2021   MCV 86.2 03/01/2021   PLT 342 03/01/2021   Recent Labs    03/25/20 1052 03/25/20 1052 11/13/20 1022 11/13/20 1912 11/14/20 0151 11/17/20 0851  NA 139  --  135  --  137 141  K 4.7  --  4.4  --  3.9 4.6  CL 104  --  105  --  108 104  CO2 21  --  21*  --  23 21  GLUCOSE 95   < > 105* 159* 107* 122*  BUN 9  --  10  --  6* 9  CREATININE 0.85  --  0.90  --  0.83 0.94  CALCIUM 9.1  --  8.3*  --  8.4* 8.9  GFRNONAA 74  --  >60  --  >60  --   GFRAA 86  --   --   --   --   --  PROT 7.1  --  7.0  --   --  6.3  ALBUMIN 4.1  --  3.7  --   --  4.2  AST 18  --  29  --   --  31  ALT 16  --  25  --   --  30  ALKPHOS 83  --  77  --   --  86  BILITOT 0.2  --  0.7  --   --  <0.2   < > = values in this interval not displayed.    Iron/TIBC/Ferritin/ %Sat    Component Value Date/Time   IRON 57 03/01/2021 1443   IRON 33 12/30/2020 1623   TIBC 347 03/01/2021 1443   TIBC 391  12/30/2020 1623   FERRITIN 62 03/01/2021 1443   FERRITIN 15 12/30/2020 1623   IRONPCTSAT 16 03/01/2021 1443   IRONPCTSAT 8 (LL) 12/30/2020 1623     RADIOGRAPHIC STUDIES: I have personally reviewed the radiological images as listed and agreed with the findings in the report. No results found.     ASSESSMENT & PLAN:  1. Iron deficiency anemia, unspecified iron deficiency anemia type   2. Other fatigue    Iron deficiency anemia,  S/p IV venofer treatments.  Labs are reviewed and discussed with patient. Hemoglobin has normalized and ferritin has improved to 62.  I will hold off Venofer today.   Fatigue, Tsh and free T4 are normal.  Recommend empiric otc Vitamin B12 566mcg daily,  I will check B12 level at next visit.   Orders Placed This Encounter  Procedures   CBC    Standing Status:   Future    Standing Expiration Date:   03/03/2022   Comprehensive metabolic panel    Standing Status:   Future    Standing Expiration Date:   03/03/2022   Iron and TIBC    Standing Status:   Future    Standing Expiration Date:   03/03/2022   Ferritin    Standing Status:   Future    Standing Expiration Date:   03/03/2022   Vitamin B12    Standing Status:   Future    Standing Expiration Date:   03/03/2022    All questions were answered. The patient knows to call the clinic with any problems questions or concerns.  Cc Bacigalupo, Dionne Bucy, MD  Return of visit:  6 months, Lab-[cbc, cmp,iron,ferr, b12] NP +/- Venofer 12 months, Lab-[ cbc,iron,ferr] MD- Venofer  Thank you for this kind referral and the opportunity to participate in the care of this patient. A copy of today's note is routed to referring provider   Earlie Server, MD, PhD Hematology Oncology [place] at [facility] 03/03/2021

## 2021-03-03 NOTE — Progress Notes (Signed)
Patient here for oncology follow-up appointment, expresses concerns of Lightheadness, fatrigue and SOB w/ exertion

## 2021-03-11 ENCOUNTER — Ambulatory Visit: Payer: 59 | Admitting: Family Medicine

## 2021-03-29 ENCOUNTER — Other Ambulatory Visit: Payer: Self-pay

## 2021-03-29 ENCOUNTER — Ambulatory Visit: Payer: 59 | Admitting: Family Medicine

## 2021-03-29 ENCOUNTER — Encounter: Payer: Self-pay | Admitting: Family Medicine

## 2021-03-29 VITALS — BP 113/74 | HR 71 | Temp 98.2°F | Resp 12 | Wt 226.0 lb

## 2021-03-29 DIAGNOSIS — Z0000 Encounter for general adult medical examination without abnormal findings: Secondary | ICD-10-CM | POA: Diagnosis not present

## 2021-03-29 DIAGNOSIS — E7800 Pure hypercholesterolemia, unspecified: Secondary | ICD-10-CM

## 2021-03-29 DIAGNOSIS — F419 Anxiety disorder, unspecified: Secondary | ICD-10-CM

## 2021-03-29 DIAGNOSIS — Z1231 Encounter for screening mammogram for malignant neoplasm of breast: Secondary | ICD-10-CM

## 2021-03-29 DIAGNOSIS — E669 Obesity, unspecified: Secondary | ICD-10-CM

## 2021-03-29 DIAGNOSIS — R739 Hyperglycemia, unspecified: Secondary | ICD-10-CM | POA: Diagnosis not present

## 2021-03-29 DIAGNOSIS — Z6835 Body mass index (BMI) 35.0-35.9, adult: Secondary | ICD-10-CM | POA: Diagnosis not present

## 2021-03-29 MED ORDER — FLUTICASONE PROPIONATE 50 MCG/ACT NA SUSP
1.0000 | Freq: Every day | NASAL | 11 refills | Status: AC | PRN
  Filled 2021-03-29: qty 16, 30d supply, fill #0
  Filled 2021-06-29: qty 16, 30d supply, fill #1

## 2021-03-29 MED ORDER — ROSUVASTATIN CALCIUM 10 MG PO TABS
10.0000 mg | ORAL_TABLET | Freq: Every day | ORAL | 3 refills | Status: DC
Start: 2021-03-29 — End: 2022-03-30
  Filled 2021-03-29: qty 90, 90d supply, fill #0
  Filled 2021-06-29: qty 90, 90d supply, fill #1
  Filled 2021-09-29: qty 90, 90d supply, fill #2
  Filled 2022-01-05: qty 90, 90d supply, fill #3

## 2021-03-29 MED ORDER — FLUOXETINE HCL 20 MG PO CAPS
60.0000 mg | ORAL_CAPSULE | Freq: Every day | ORAL | 1 refills | Status: DC
  Filled 2021-03-29: qty 270, 90d supply, fill #0

## 2021-03-29 NOTE — Assessment & Plan Note (Signed)
Stable, followed by heme/onc

## 2021-03-29 NOTE — Progress Notes (Signed)
Annual Wellness Visit     Patient: Deborah Jenkins, Female    DOB: [birth year], 65 y.o.   MRN: [id number] Visit Date: 03/29/2021  Today's Provider: Shirlee Latch, MD   Chief Complaint  Patient presents with   Annual Exam   Anemia   Hyperlipidemia    Subjective    Deborah Jenkins is a 65 y.o. female who presents today for her Annual Wellness Visit. She reports consuming a low fat diet. Exercise is limited by work-related stress. She generally feels well. She reports sleeping well. She does not have additional problems to discuss today.   HPI  Iron Deficiency Anemia - Admitted for lower GI bleed in April 22; followed by Heme/Onc, next appt in Oct - Denies recent fatigue, SOB, tachycardia, blood in stool  Anxiety - Pt. expresses increased anxiety since her mother passed in Feb - Pt. has been stable on Prozac 20 mg BID  Hair loss - Stable on Minoxidil & Viviscal, followed by Derm  Hyperlipidemia - Pt. states that she has recently missed several doses of rosuvastatin due to forgetfulness; requests repeat lipid panel today  Health Maintenance - Due for annual mammo - Due for shingles & pneumococcal vaccines, pt. would like to defer to a later date - UTD on colonoscopy, Tdap, & pap smear  Medications: Outpatient Medications Prior to Visit  Medication Sig   albuterol (VENTOLIN HFA) 108 (90 Base) MCG/ACT inhaler Inhale 2 puffs into the lungs every 4 (four) hours as needed for wheezing or shortness of breath. (Patient not taking: Reported on 03/03/2021)   cholecalciferol (VITAMIN D3) 25 MCG (1000 UNIT) tablet Take 1,000 Units by mouth daily.   hydrocortisone cream 0.5 % Apply 1 application topically daily as needed for itching (on elbows).   Melatonin 10 MG TABS Take 10 mg by mouth at bedtime.   NON FORMULARY Viviscal/ For hair loss   omeprazole (PRILOSEC) 20 MG capsule TAKE 1 CAPSULE BY MOUTH ONCE A DAY   [DISCONTINUED] FLUoxetine (PROZAC) 20 MG capsule TAKE 2  CAPSULES BY MOUTH DAILY. (Patient taking differently: Take 20 mg by mouth at bedtime.)   [DISCONTINUED] fluticasone (FLONASE) 50 MCG/ACT nasal spray Place 1 spray into both nostrils daily as needed for allergies or rhinitis.   [DISCONTINUED] rosuvastatin (CRESTOR) 10 MG tablet Take 1 tablet (10 mg total) by mouth daily. (Patient taking differently: Take 10 mg by mouth at bedtime.)   No facility-administered medications prior to visit.    Allergies  Allergen Reactions   Atorvastatin     Hair loss    Patient Care Team: Erasmo Downer, MD as PCP - General (Family Medicine)  Review of Systems  Constitutional:  Negative for activity change, appetite change and fatigue.  HENT: Negative.    Eyes: Negative.   Respiratory:  Negative for chest tightness and shortness of breath.   Cardiovascular:  Negative for chest pain and leg swelling.  Gastrointestinal: Negative.   Endocrine: Negative.  Negative for polydipsia and polyuria.  Genitourinary: Negative.   Musculoskeletal: Negative.   Neurological: Negative.         Objective    Vitals: BP 113/74 (BP Location: Right Arm, Cuff Size: Large)   Pulse 71   Temp 98.2 F (36.8 C)   Resp 12   Wt 226 lb (102.5 kg)   BMI 35.40 kg/m     Physical Exam Constitutional:      General: She is not in acute distress.    Appearance: Normal appearance.  HENT:  Head: Normocephalic and atraumatic.     Right Ear: External ear normal.     Left Ear: External ear normal.  Eyes:     Conjunctiva/sclera: Conjunctivae normal.  Cardiovascular:     Rate and Rhythm: Normal rate and regular rhythm.     Pulses: Normal pulses.     Heart sounds: Normal heart sounds.  Pulmonary:     Effort: Pulmonary effort is normal.     Breath sounds: Normal breath sounds.  Abdominal:     General: Abdomen is flat. Bowel sounds are normal.     Palpations: Abdomen is soft.  Musculoskeletal:     Cervical back: Normal range of motion and neck supple.  Skin:     General: Skin is warm and dry.  Neurological:     General: No focal deficit present.     Mental Status: She is alert.  Psychiatric:        Behavior: Behavior normal.        Thought Content: Thought content normal.    Most recent functional status assessment: In your present state of health, do you have any difficulty performing the following activities: 03/29/2021  Hearing? N  Vision? N  Difficulty concentrating or making decisions? N  Walking or climbing stairs? N  Dressing or bathing? N  Doing errands, shopping? N  Some recent data might be hidden   Most recent fall risk assessment: Fall Risk  03/29/2021  Falls in the past year? 0  Number falls in past yr: 0  Injury with Fall? 0  Risk for fall due to : -  Follow up -    Most recent depression screenings: PHQ 2/9 Scores 03/29/2021 11/17/2020  PHQ - 2 Score 4 2  PHQ- 9 Score 10 10   Most recent cognitive screening: No flowsheet data found. Most recent Audit-C alcohol use screening Alcohol Use Disorder Test (AUDIT) 03/29/2021  1. How often do you have a drink containing alcohol? 0  2. How many drinks containing alcohol do you have on a typical day when you are drinking? 0  3. How often do you have six or more drinks on one occasion? 0  AUDIT-C Score 0  Alcohol Brief Interventions/Follow-up -   A score of 3 or more in women, and 4 or more in men indicates increased risk for alcohol abuse, EXCEPT if all of the points are from question 1   No results found for any visits on 03/29/21.  Assessment & Plan     Annual wellness visit done today including the all of the following: Reviewed patient's Family Medical History Reviewed and updated list of patient's medical providers Assessment of cognitive impairment was done Assessed patient's functional ability Established a written schedule for health screening services Health Risk Assessent Completed and Reviewed  Exercise Activities and Dietary recommendations  Goals    None      There is no immunization history on file for this patient.  Health Maintenance  Topic Date Due   COVID-19 Vaccine (1) Never done   Pneumococcal Vaccine 42-72 Years old (1 - PCV) Never done   Zoster Vaccines- Shingrix (1 of 2) Never done   INFLUENZA VACCINE  04/05/2021   MAMMOGRAM  05/08/2021   TETANUS/TDAP  07/20/2022   PAP SMEAR-Modifier  01/11/2023   COLONOSCOPY (Pts 45-44yrs Insurance coverage will need to be confirmed)  12/10/2030   Hepatitis C Screening  Completed   HIV Screening  Completed   HPV VACCINES  Aged Out     Discussed  health benefits of physical activity, and encouraged her to engage in regular exercise appropriate for her age and condition.    Problem List Items Addressed This Visit       Other   Anxiety    Uncontrolled Increase Prozac to 60 mg daily       Relevant Medications   FLUoxetine (PROZAC) 20 MG capsule   Hyperlipidemia    Encouraged pt. to set phone alarm to reminder her to take rosuvastatin; recheck lipid panel today       Relevant Medications   rosuvastatin (CRESTOR) 10 MG tablet   Other Relevant Orders   Lipid panel   Comprehensive metabolic panel   Obesity    Discussed importance of healthy weight management Discussed diet and exercise        Visit for annual health examination - Primary    - Provided pt. info on shingles vaccine       Relevant Orders   Hemoglobin A1c   Lipid panel   Comprehensive metabolic panel   MM 3D SCREEN BREAST BILATERAL   Other Visit Diagnoses     Hyperglycemia       Relevant Orders   Hemoglobin A1c   Screening mammogram for breast cancer       Relevant Orders   MM 3D SCREEN BREAST BILATERAL        Return in about 2 months (around 05/30/2021) for MDD/GAD f/u, virtual ok.      Queen Blossom, MS3  Patient seen along with MS3 student Queen Blossom. I personally evaluated this patient along with the student, and verified all aspects of the history, physical exam, and  medical decision making as documented by the student. I agree with the student's documentation and have made all necessary edits.  Rayne Loiseau, Marzella Schlein, MD, MPH [facility]     Ahmyah Gidley, Marzella Schlein, MD, MPH [facility]

## 2021-03-29 NOTE — Assessment & Plan Note (Signed)
Encouraged pt. to set phone alarm to reminder her to take rosuvastatin; recheck lipid panel today

## 2021-03-29 NOTE — Assessment & Plan Note (Addendum)
Uncontrolled Increase Prozac to 60 mg daily

## 2021-03-29 NOTE — Assessment & Plan Note (Signed)
Discussed importance of healthy weight management Discussed diet and exercise  

## 2021-03-29 NOTE — Assessment & Plan Note (Addendum)
-   Provided pt. info on shingles vaccine

## 2021-03-30 ENCOUNTER — Encounter: Payer: Self-pay | Admitting: Family Medicine

## 2021-03-30 ENCOUNTER — Other Ambulatory Visit: Payer: Self-pay

## 2021-03-30 ENCOUNTER — Other Ambulatory Visit: Payer: Self-pay | Admitting: Family Medicine

## 2021-03-30 DIAGNOSIS — R7303 Prediabetes: Secondary | ICD-10-CM

## 2021-03-30 LAB — COMPREHENSIVE METABOLIC PANEL
ALT: 33 IU/L — ABNORMAL HIGH (ref 0–32)
AST: 23 IU/L (ref 0–40)
Albumin/Globulin Ratio: 2 (ref 1.2–2.2)
Albumin: 4.6 g/dL (ref 3.8–4.8)
Alkaline Phosphatase: 104 IU/L (ref 44–121)
BUN/Creatinine Ratio: 12 (ref 12–28)
BUN: 9 mg/dL (ref 8–27)
Bilirubin Total: <0.2 mg/dL (ref 0.0–1.2)
CO2: 22 mmol/L (ref 20–29)
Calcium: 9.8 mg/dL (ref 8.7–10.3)
Chloride: 104 mmol/L (ref 96–106)
Creatinine, Ser: 0.77 mg/dL (ref 0.57–1.00)
Globulin, Total: 2.3 g/dL (ref 1.5–4.5)
Glucose: 102 mg/dL — ABNORMAL HIGH (ref 65–99)
Potassium: 4.8 mmol/L (ref 3.5–5.2)
Sodium: 139 mmol/L (ref 134–144)
Total Protein: 6.9 g/dL (ref 6.0–8.5)
eGFR: 87 mL/min/{1.73_m2} (ref >59)

## 2021-03-30 LAB — LIPID PANEL
Chol/HDL Ratio: 3.6 ratio (ref 0.0–4.4)
Cholesterol, Total: 284 mg/dL — ABNORMAL HIGH (ref 100–199)
HDL: 79 mg/dL (ref >39)
LDL Chol Calc (NIH): 194 mg/dL — ABNORMAL HIGH (ref 0–99)
Triglycerides: 72 mg/dL (ref 0–149)
VLDL Cholesterol Cal: 11 mg/dL (ref 5–40)

## 2021-03-30 LAB — HEMOGLOBIN A1C
Est. average glucose Bld gHb Est-mCnc: 131 mg/dL
Hgb A1c MFr Bld: 6.2 % — ABNORMAL HIGH (ref 4.8–5.6)

## 2021-03-30 MED ORDER — SEMAGLUTIDE-WEIGHT MANAGEMENT 1 MG/0.5ML ~~LOC~~ SOAJ
1.0000 mg | SUBCUTANEOUS | 0 refills | Status: DC
  Filled 2021-03-30: qty 2, 28d supply, fill #0

## 2021-03-30 MED ORDER — SEMAGLUTIDE-WEIGHT MANAGEMENT 0.5 MG/0.5ML ~~LOC~~ SOAJ
0.5000 mg | SUBCUTANEOUS | 0 refills | Status: DC
  Filled 2021-03-30: qty 2, 28d supply, fill #0

## 2021-03-30 MED ORDER — SEMAGLUTIDE-WEIGHT MANAGEMENT 0.25 MG/0.5ML ~~LOC~~ SOAJ
0.2500 mg | SUBCUTANEOUS | 0 refills | Status: DC
  Filled 2021-03-30: qty 2, 28d supply, fill #0

## 2021-03-30 MED ORDER — OZEMPIC (0.25 OR 0.5 MG/DOSE) 2 MG/1.5ML ~~LOC~~ SOPN
PEN_INJECTOR | SUBCUTANEOUS | 3 refills | Status: DC
  Filled 2021-03-30: qty 1.5, 30d supply, fill #0
  Filled 2021-05-12: qty 4.5, 84d supply, fill #1

## 2021-03-30 MED ORDER — SEMAGLUTIDE-WEIGHT MANAGEMENT 2.4 MG/0.75ML ~~LOC~~ SOAJ
2.4000 mg | SUBCUTANEOUS | 0 refills | Status: DC
  Filled 2021-03-30: qty 3, 28d supply, fill #0

## 2021-03-30 MED ORDER — SEMAGLUTIDE-WEIGHT MANAGEMENT 1.7 MG/0.75ML ~~LOC~~ SOAJ
1.7000 mg | SUBCUTANEOUS | 0 refills | Status: DC
  Filled 2021-03-30: qty 3, 28d supply, fill #0

## 2021-03-30 NOTE — Progress Notes (Signed)
Wegovy on backorder. Will send in [place] instead. Same medication. There is a savings card she can use.

## 2021-04-09 ENCOUNTER — Other Ambulatory Visit: Payer: Self-pay

## 2021-04-09 ENCOUNTER — Other Ambulatory Visit: Payer: Self-pay | Admitting: Family Medicine

## 2021-04-09 DIAGNOSIS — K2100 Gastro-esophageal reflux disease with esophagitis, without bleeding: Secondary | ICD-10-CM

## 2021-04-09 MED ORDER — OMEPRAZOLE 20 MG PO CPDR
DELAYED_RELEASE_CAPSULE | Freq: Every day | ORAL | 1 refills | Status: DC
Start: 2021-04-09 — End: 2021-10-06
  Filled 2021-04-09: qty 90, 90d supply, fill #0
  Filled 2021-07-08: qty 90, 90d supply, fill #1

## 2021-05-13 ENCOUNTER — Encounter: Payer: Self-pay | Admitting: Oncology

## 2021-05-13 ENCOUNTER — Other Ambulatory Visit: Payer: Self-pay

## 2021-06-02 ENCOUNTER — Inpatient Hospital Stay: Payer: 59 | Attending: Oncology

## 2021-06-02 DIAGNOSIS — F17210 Nicotine dependence, cigarettes, uncomplicated: Secondary | ICD-10-CM | POA: Diagnosis not present

## 2021-06-02 DIAGNOSIS — Z79899 Other long term (current) drug therapy: Secondary | ICD-10-CM | POA: Diagnosis not present

## 2021-06-02 DIAGNOSIS — D509 Iron deficiency anemia, unspecified: Secondary | ICD-10-CM | POA: Insufficient documentation

## 2021-06-02 LAB — CBC
HCT: 41.8 % (ref 36.0–46.0)
Hemoglobin: 13 g/dL (ref 12.0–15.0)
MCH: 27.7 pg (ref 26.0–34.0)
MCHC: 31.1 g/dL (ref 30.0–36.0)
MCV: 89.1 fL (ref 80.0–100.0)
Platelets: 325 10*3/uL (ref 150–400)
RBC: 4.69 MIL/uL (ref 3.87–5.11)
RDW: 14.2 % (ref 11.5–15.5)
WBC: 7.1 10*3/uL (ref 4.0–10.5)
nRBC: 0 % (ref 0.0–0.2)

## 2021-06-02 LAB — COMPREHENSIVE METABOLIC PANEL
ALT: 22 U/L (ref 0–44)
AST: 21 U/L (ref 15–41)
Albumin: 4.1 g/dL (ref 3.5–5.0)
Alkaline Phosphatase: 71 U/L (ref 38–126)
Anion gap: 8 (ref 5–15)
BUN: 10 mg/dL (ref 8–23)
CO2: 24 mmol/L (ref 22–32)
Calcium: 8.9 mg/dL (ref 8.9–10.3)
Chloride: 105 mmol/L (ref 98–111)
Creatinine, Ser: 0.8 mg/dL (ref 0.44–1.00)
GFR, Estimated: >60 mL/min (ref >60)
Glucose, Bld: 126 mg/dL — ABNORMAL HIGH (ref 70–99)
Potassium: 3.3 mmol/L — ABNORMAL LOW (ref 3.5–5.1)
Sodium: 137 mmol/L (ref 135–145)
Total Bilirubin: 0.4 mg/dL (ref 0.3–1.2)
Total Protein: 7.4 g/dL (ref 6.5–8.1)

## 2021-06-02 LAB — IRON AND TIBC
Iron: 48 ug/dL (ref 28–170)
Saturation Ratios: 15 % (ref 10.4–31.8)
TIBC: 314 ug/dL (ref 250–450)
UIBC: 266 ug/dL

## 2021-06-02 LAB — VITAMIN B12: Vitamin B-12: 430 pg/mL (ref 180–914)

## 2021-06-02 LAB — FERRITIN: Ferritin: 40 ng/mL (ref 11–307)

## 2021-06-04 ENCOUNTER — Inpatient Hospital Stay: Payer: 59 | Admitting: Oncology

## 2021-06-04 ENCOUNTER — Inpatient Hospital Stay: Payer: 59

## 2021-06-04 ENCOUNTER — Inpatient Hospital Stay (HOSPITAL_BASED_OUTPATIENT_CLINIC_OR_DEPARTMENT_OTHER): Payer: 59 | Admitting: Oncology

## 2021-06-04 DIAGNOSIS — D509 Iron deficiency anemia, unspecified: Secondary | ICD-10-CM

## 2021-06-04 NOTE — Progress Notes (Signed)
Pt confirmed availability for virtual visit at scheduled time.  Pt has no questions or concerns at this time. Pt does endorse approx 13lb weight loss which she contributes to starting ozempic.

## 2021-06-04 NOTE — Progress Notes (Addendum)
Hematology/Oncology Consult note [facility] Telephone:([phone] [phone]   Patient Care Team: Virginia Crews, MD as PCP - General (Family Medicine)  REFERRING PROVIDER: Virginia Crews, MD CHIEF COMPLAINTS/REASON FOR VISIT:  Evaluation of iron deficiency anemia  I connected with Deborah Jenkins on 06/04/21 at 10:30 AM EDT by video enabled telemedicine visit and verified that I am speaking with the correct person using two identifiers.   I discussed the limitations, risks, security and privacy concerns of performing an evaluation and management service by telemedicine and the availability of in-person appointments. I also discussed with the patient that there may be a patient responsible charge related to this service. The patient expressed understanding and agreed to proceed.   Other persons participating in the visit and their role in the encounter: NP, Patient    Patient's location: Home  Provider's location: Home    HISTORY OF PRESENTING ILLNESS:  Deborah Jenkins is a  65 y.o.  female with PMH listed below was seen in consultation at the request of Virginia Crews, MD  for evaluation of iron deficiency anemia.   Reviewed patient's recent labs  12/30/2020 Labs revealed anemia with hemoglobin of 9.7, mcv 81  .   Reviewed patient's previous labs ordered by primary care physician's office, anemia is new since March 2022.  March 2022 she was admitted initially to [facility] for lower GI bleeding due to diverticulosis.  Patient was transfused with PRBC during admission and a nuclear medicine scan was performed.  A bleeding spot at the transverse colon and the splenic flexure was localized.  Patient was transferred to [facility].  Patient was recommended to proceed with conversive management and active bleeding resolved.  Patient was was discharged home.  And follow-up with gastroenterologist Dr.Tahiliani 12/09/2020 upper EGD showed gastric  mucosal atrophy, single mucosal papule found in the stomach, biopsied small hiatal hernia, normal examined duodenum.  Biopsies were obtained in the gastric body, incisura and gastric antrum-pathology negative for malignancy or dysplasia.  Colonoscopy showed 4 mm polyp in the cecum.  Removed in retrieved-pathology showed benign colonic mucosa with reactive lymphoid tissue..  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  Biopsied.  Pathology showed benign colonic mucosa with submucosal adipose tissue consistent with lipoma.  Diverticulosis in the sigmoid colon.  Otherwise normal.  Nonbleeding internal hemorrhoids.  Patient is postmenopausal.  She denies any additional episodes of rectal bleeding. Reports feeling very tired.  She craves for ice chips and cornstarch.  Shortness of breath with exertion Also reports hair loss.   INTERVAL HISTORY Deborah Jenkins is a 65 year old female who presents for follow-up for iron deficiency anemia.  She last received 5 doses of IV Venofer in May 2022.   Patient reports feeling tired.  She recently started a new job where she works 12-hour shifts 2 to 3 days/week.  She denies any bleeding.  She is currently taking oral iron supplements and is tolerating them well.  Denies any GI upset or constipation.   Review of Systems  Constitutional:  Positive for fatigue. Negative for appetite change, fever and unexpected weight change.  HENT:   Negative for nosebleeds, sore throat and trouble swallowing.   Eyes: Negative.   Respiratory: Negative.  Negative for cough, shortness of breath and wheezing.   Cardiovascular: Negative.  Negative for chest pain and leg swelling.  Gastrointestinal:  Negative for abdominal pain, blood in stool, constipation, diarrhea, nausea and vomiting.  Endocrine: Negative.   Genitourinary: Negative.  Negative  for bladder incontinence, hematuria and nocturia.   Musculoskeletal: Negative.  Negative for back pain and flank pain.  Skin: Negative.    Neurological: Negative.  Negative for dizziness, headaches, light-headedness and numbness.  Hematological: Negative.   Psychiatric/Behavioral: Negative.  Negative for confusion. The patient is not nervous/anxious.    MEDICAL HISTORY:  Past Medical History:  Diagnosis Date   Depression 2006   GERD (gastroesophageal reflux disease) 2006   Iron deficiency anemia 01/04/2021    SURGICAL HISTORY: Past Surgical History:  Procedure Laterality Date   COLONOSCOPY WITH PROPOFOL N/A 12/09/2020   Procedure: COLONOSCOPY WITH PROPOFOL;  Surgeon: Virgel Manifold, MD;  Location: [facility] ENDOSCOPY;  Service: Endoscopy;  Laterality: N/A;   ESOPHAGOGASTRODUODENOSCOPY (EGD) WITH PROPOFOL N/A 12/09/2020   Procedure: ESOPHAGOGASTRODUODENOSCOPY (EGD) WITH PROPOFOL;  Surgeon: Virgel Manifold, MD;  Location: [facility] ENDOSCOPY;  Service: Endoscopy;  Laterality: N/A;   FRACTURE SURGERY     TONSILLECTOMY Bilateral Childhood   TUBAL LIGATION     WRIST FRACTURE SURGERY Right    in childhood    SOCIAL HISTORY: Social History   Socioeconomic History   Marital status: Single    Spouse name: Not on file   Number of children: 2   Years of education: 14   Highest education level: Associate degree: academic program  Occupational History   Occupation: Ship broker: [employer]    Comment: [employer]  Tobacco Use   Smoking status: Every Day    Packs/day: 0.30    Years: 20.00    Pack years: 6.00    Types: Cigarettes   Smokeless tobacco: Never   Tobacco comments:    started smoking at 65 YO; is smoking 1/3 PPD  Vaping Use   Vaping Use: Never used  Substance and Sexual Activity   Alcohol use: No    Alcohol/week: 0.0 standard drinks   Drug use: No   Sexual activity: Yes    Birth control/protection: Post-menopausal  Other Topics Concern   Not on file  Social History Narrative      Social Determinants of Health   Financial Resource Strain: Not on file  Food Insecurity: Not on file   Transportation Needs: Not on file  Physical Activity: Not on file  Stress: Not on file  Social Connections: Not on file  Intimate Partner Violence: Not on file    FAMILY HISTORY: Family History  Problem Relation Age of Onset   Hypertension Mother    Diverticulosis Mother    Healthy Father    Hypertension Sister    Healthy Sister    Healthy Brother    Healthy Sister    Alcohol abuse Brother    Alcohol abuse Brother    Cirrhosis Brother    Cirrhosis Brother    Colon cancer Neg Hx    Breast cancer Neg Hx    Ovarian cancer Neg Hx    Cervical cancer Neg Hx     ALLERGIES:  is allergic to atorvastatin.  MEDICATIONS:  Current Outpatient Medications  Medication Sig Dispense Refill   albuterol (VENTOLIN HFA) 108 (90 Base) MCG/ACT inhaler Inhale 2 puffs into the lungs every 4 (four) hours as needed for wheezing or shortness of breath. 18 g 0   cholecalciferol (VITAMIN D3) 25 MCG (1000 UNIT) tablet Take 1,000 Units by mouth daily.     FLUoxetine (PROZAC) 20 MG capsule Take 3 capsules (60 mg total) by mouth daily. 270 capsule 1   fluticasone (FLONASE) 50 MCG/ACT nasal spray Place 1 spray into  both nostrils daily as needed for allergies or rhinitis. 16 g 11   hydrocortisone cream 0.5 % Apply 1 application topically daily as needed for itching (on elbows).     Melatonin 10 MG TABS Take 10 mg by mouth at bedtime.     NON FORMULARY Viviscal/ For hair loss     omeprazole (PRILOSEC) 20 MG capsule TAKE 1 CAPSULE BY MOUTH ONCE A DAY 90 capsule 1   rosuvastatin (CRESTOR) 10 MG tablet Take 1 tablet (10 mg total) by mouth daily. 90 tablet 3   Semaglutide,0.25 or 0.5MG /DOS, (OZEMPIC, 0.25 OR 0.5 MG/DOSE,) 2 MG/1.5ML SOPN Inject 0.25 mg into the skin once a week for 14 days, THEN 0.5 mg once a week for 28 days. 1.5 mL 3   No current facility-administered medications for this visit.     PHYSICAL EXAMINATION: ECOG PERFORMANCE STATUS: 1 - Symptomatic but completely ambulatory There were no  vitals filed for this visit.  There were no vitals filed for this visit.   Physical Exam Constitutional:      Appearance: Normal appearance. She is obese.  Pulmonary:     Effort: Pulmonary effort is normal.  Neurological:     Mental Status: She is alert and oriented to person, place, and time. Mental status is at baseline.      CMP Latest Ref Rng & Units 06/02/2021  Glucose 70 - 99 mg/dL 126(H)  BUN 8 - 23 mg/dL 10  Creatinine 0.44 - 1.00 mg/dL 0.80  Sodium 135 - 145 mmol/L 137  Potassium 3.5 - 5.1 mmol/L 3.3(L)  Chloride 98 - 111 mmol/L 105  CO2 22 - 32 mmol/L 24  Calcium 8.9 - 10.3 mg/dL 8.9  Total Protein 6.5 - 8.1 g/dL 7.4  Total Bilirubin 0.3 - 1.2 mg/dL 0.4  Alkaline Phos 38 - 126 U/L 71  AST 15 - 41 U/L 21  ALT 0 - 44 U/L 22   CBC Latest Ref Rng & Units 06/02/2021  WBC 4.0 - 10.5 K/uL 7.1  Hemoglobin 12.0 - 15.0 g/dL 13.0  Hematocrit 36.0 - 46.0 % 41.8  Platelets 150 - 400 K/uL 325     LABORATORY DATA:  I have reviewed the data as listed Lab Results  Component Value Date   WBC 7.1 06/02/2021   HGB 13.0 06/02/2021   HCT 41.8 06/02/2021   MCV 89.1 06/02/2021   PLT 325 06/02/2021   Recent Labs    11/13/20 1022 11/13/20 1912 11/14/20 0151 11/17/20 0851 03/29/21 1026 06/02/21 1424  NA 135  --  137 141 139 137  K 4.4  --  3.9 4.6 4.8 3.3*  CL 105  --  108 104 104 105  CO2 21*  --  23 21 22 24   GLUCOSE 105*   < > 107* 122* 102* 126*  BUN 10  --  6* 9 9 10   CREATININE 0.90  --  0.83 0.94 0.77 0.80  CALCIUM 8.3*  --  8.4* 8.9 9.8 8.9  GFRNONAA >60  --  >60  --   --  >60  PROT 7.0  --   --  6.3 6.9 7.4  ALBUMIN 3.7  --   --  4.2 4.6 4.1  AST 29  --   --  31 23 21   ALT 25  --   --  30 33* 22  ALKPHOS 77  --   --  86 104 71  BILITOT 0.7  --   --  <0.2 <0.2 0.4   < > =  values in this interval not displayed.    Iron/TIBC/Ferritin/ %Sat    Component Value Date/Time   IRON 48 06/02/2021 1424   IRON 33 12/30/2020 1623   TIBC 314 06/02/2021 1424    TIBC 391 12/30/2020 1623   FERRITIN 40 06/02/2021 1424   FERRITIN 15 12/30/2020 1623   IRONPCTSAT 15 06/02/2021 1424   IRONPCTSAT 8 (LL) 12/30/2020 1623     RADIOGRAPHIC STUDIES: I have personally reviewed the radiological images as listed and agreed with the findings in the report. No results found.  ASSESSMENT & PLAN: Deborah Jenkins presents for routine evaluation for iron deficiency anemia.  IDA likely secondary to GI bleeding.  She denies any additional bleeding since her hospitalization.  She received 5 doses of IV Venofer in May 2022.  Reports feeling much improved since her infusion.  Has noticed increasing fatigue over the past few weeks.  Labs from 06/02/2021 show hemoglobin of 13.0, ferritin of 40, and iron saturation 15%.  B12 levels are 430.  Goal ferritin is between 50 and 100.  Recommend 2 additional doses of IV Venofer.  She will continue her oral iron as she is tolerating this well.  She will return to clinic in 3 months for follow-up with lab work (CBC, CMP, ferritin, iron panel) to see Dr. Tasia Catchings and possible IV iron.  No diagnosis found.  I provided 20 minutes of face-to-face video visit time during this encounter, and > 50% was spent counseling as documented under my assessment & plan.  No orders of the defined types were placed in this encounter.   All questions were answered. The patient knows to call the clinic with any problems questions or concerns.  Cc Bacigalupo, Dionne Bucy, MD  Thank you for this kind referral and the opportunity to participate in the care of this patient. A copy of today's note is routed to referring provider   Faythe Casa, NP 06/04/2021 10:46 AM

## 2021-06-07 ENCOUNTER — Telehealth: Payer: Self-pay | Admitting: Oncology

## 2021-06-07 ENCOUNTER — Encounter: Payer: Self-pay | Admitting: Oncology

## 2021-06-07 NOTE — Telephone Encounter (Signed)
Per scheduling message for Deborah Jenkins on 10/3. Pt does not require any additional Venofer treatments. Appts for 10/6 and 10/12 have been canceled and VM left for patient that she does not need to report to those appts and to keep scheduled follow-up in a few months.

## 2021-06-10 ENCOUNTER — Inpatient Hospital Stay: Payer: 59

## 2021-06-12 ENCOUNTER — Other Ambulatory Visit: Payer: Self-pay

## 2021-06-12 ENCOUNTER — Ambulatory Visit
Admission: EM | Admit: 2021-06-12 | Discharge: 2021-06-12 | Disposition: A | Discharge status: Home / Self Care | Payer: 59 | Attending: Emergency Medicine | Admitting: Emergency Medicine

## 2021-06-12 ENCOUNTER — Encounter: Payer: Self-pay | Admitting: Emergency Medicine

## 2021-06-12 DIAGNOSIS — M5442 Lumbago with sciatica, left side: Secondary | ICD-10-CM

## 2021-06-12 MED ORDER — CYCLOBENZAPRINE HCL 10 MG PO TABS: 10.0000 mg | ORAL_TABLET | Freq: Two times a day (BID) | ORAL | 0 refills | Status: DC | PRN

## 2021-06-12 MED ORDER — IBUPROFEN 600 MG PO TABS: 600.0000 mg | ORAL_TABLET | Freq: Four times a day (QID) | ORAL | 0 refills | Status: DC | PRN

## 2021-06-12 NOTE — Discharge Instructions (Addendum)
Take the ibuprofen as needed for discomfort.  Take the muscle relaxer as needed for muscle spasm; Do not drive, operate machinery, or drink alcohol with this medication as it can cause drowsiness.   Follow up with your primary care provider or an orthopedist if your symptoms are not improving.     

## 2021-06-12 NOTE — ED Provider Notes (Signed)
Roderic Palau    CSN: [id number] Arrival date & time: 06/12/21  7124      History   Chief Complaint Chief Complaint  Patient presents with   Back Pain    HPI Deborah Jenkins is a 65 y.o. female.  Patient presents with 2-day history of left lower back pain.  No falls or injury.  The pain radiates to her left buttock; worse with ambulation and position changes; feels like muscle spasm.  No numbness, weakness, saddle anesthesia, loss of bowel/bladder control, abdominal pain, dysuria, hematuria, or other symptoms.  Treatment at home with IcyHot patch, ibuprofen, TENS unit.  The history is provided by the patient.   Past Medical History:  Diagnosis Date   Depression 2006   GERD (gastroesophageal reflux disease) 2006   Iron deficiency anemia 01/04/2021    Patient Active Problem List   Diagnosis Date Noted   Visit for annual health examination 03/29/2021   Iron deficiency anemia 01/04/2021   Other dysphagia    Gastric nodule    Atrophic gastritis without hemorrhage    Colon cancer screening    Polyp of colon    Diverticulosis of colon with hemorrhage    Diverticulosis 11/14/2020   Acute blood loss anemia 11/14/2020   Acute lower GI bleeding 11/13/2020   Obesity 03/22/2019   Psoriasis 03/22/2019   Anxiety 04/06/2018   Hyperlipidemia 04/06/2018   Tobacco use disorder 01/10/2018   GERD (gastroesophageal reflux disease) 01/10/2018   Allergic rhinitis 08/21/2015   Depression 02/19/2014    Past Surgical History:  Procedure Laterality Date   COLONOSCOPY WITH PROPOFOL N/A 12/09/2020   Procedure: COLONOSCOPY WITH PROPOFOL;  Surgeon: Virgel Manifold, MD;  Location: [facility] ENDOSCOPY;  Service: Endoscopy;  Laterality: N/A;   ESOPHAGOGASTRODUODENOSCOPY (EGD) WITH PROPOFOL N/A 12/09/2020   Procedure: ESOPHAGOGASTRODUODENOSCOPY (EGD) WITH PROPOFOL;  Surgeon: Virgel Manifold, MD;  Location: [facility] ENDOSCOPY;  Service: Endoscopy;  Laterality: N/A;   FRACTURE SURGERY      TONSILLECTOMY Bilateral Childhood   TUBAL LIGATION     WRIST FRACTURE SURGERY Right    in childhood    OB History   No obstetric history on file.      Home Medications    Prior to Admission medications   Medication Sig Start Date End Date Taking? Authorizing Provider  cyclobenzaprine (FLEXERIL) 10 MG tablet Take 1 tablet (10 mg total) by mouth 2 (two) times daily as needed for muscle spasms. 06/12/21  Yes Sharion Balloon, NP  ibuprofen (ADVIL) 600 MG tablet Take 1 tablet (600 mg total) by mouth every 6 (six) hours as needed. 06/12/21  Yes Sharion Balloon, NP  albuterol (VENTOLIN HFA) 108 (90 Base) MCG/ACT inhaler Inhale 2 puffs into the lungs every 4 (four) hours as needed for wheezing or shortness of breath. 04/27/20   Sharion Balloon, NP  cholecalciferol (VITAMIN D3) 25 MCG (1000 UNIT) tablet Take 1,000 Units by mouth daily.    [provider]  FLUoxetine (PROZAC) 20 MG capsule Take 3 capsules (60 mg total) by mouth daily. 03/29/21 03/29/22  Virginia Crews, MD  fluticasone (FLONASE) 50 MCG/ACT nasal spray Place 1 spray into both nostrils daily as needed for allergies or rhinitis. 03/29/21   Virginia Crews, MD  hydrocortisone cream 0.5 % Apply 1 application topically daily as needed for itching (on elbows).    [provider]  Melatonin 10 MG TABS Take 10 mg by mouth at bedtime.    [provider]  Baruch Gouty  Viviscal/ For hair loss    [provider]  omeprazole (PRILOSEC) 20 MG capsule TAKE 1 CAPSULE BY MOUTH ONCE A DAY 04/09/21 04/09/22  Virginia Crews, MD  rosuvastatin (CRESTOR) 10 MG tablet Take 1 tablet (10 mg total) by mouth daily. 03/29/21   Virginia Crews, MD  Semaglutide,0.25 or 0.5MG /DOS, (OZEMPIC, 0.25 OR 0.5 MG/DOSE,) 2 MG/1.5ML SOPN Inject 0.25 mg into the skin once a week for 14 days, THEN 0.5 mg once a week for 28 days. 03/30/21 08/05/21  Virginia Crews, MD    Family History Family History  Problem Relation Age of  Onset   Hypertension Mother    Diverticulosis Mother    Healthy Father    Hypertension Sister    Healthy Sister    Healthy Brother    Healthy Sister    Alcohol abuse Brother    Alcohol abuse Brother    Cirrhosis Brother    Cirrhosis Brother    Colon cancer Neg Hx    Breast cancer Neg Hx    Ovarian cancer Neg Hx    Cervical cancer Neg Hx     Social History Social History   Tobacco Use   Smoking status: Every Day    Packs/day: 0.30    Years: 20.00    Pack years: 6.00    Types: Cigarettes   Smokeless tobacco: Never   Tobacco comments:    started smoking at 65 YO; is smoking 1/3 PPD  Vaping Use   Vaping Use: Never used  Substance Use Topics   Alcohol use: No    Alcohol/week: 0.0 standard drinks   Drug use: No     Allergies   Atorvastatin   Review of Systems Review of Systems  Constitutional:  Negative for chills and fever.  Respiratory:  Negative for cough and shortness of breath.   Cardiovascular:  Negative for chest pain and palpitations.  Gastrointestinal:  Negative for abdominal pain and vomiting.  Genitourinary:  Negative for dysuria and hematuria.  Musculoskeletal:  Positive for back pain. Negative for gait problem.  Skin:  Negative for color change and rash.  Neurological:  Negative for weakness and numbness.  All other systems reviewed and are negative.   Physical Exam Triage Vital Signs ED Triage Vitals  Enc Vitals Group     BP      Pulse      Resp      Temp      Temp src      SpO2      Weight      Height      Head Circumference      Peak Flow      Pain Score      Pain Loc      Pain Edu?      Excl. in [place]?    No data found.  Updated Vital Signs BP 118/79   Pulse 90   Temp 98.6 F (37 C)   SpO2 98%   Visual Acuity Right Eye Distance:   Left Eye Distance:   Bilateral Distance:    Right Eye Near:   Left Eye Near:    Bilateral Near:     Physical Exam Vitals and nursing note reviewed.  Constitutional:      General: She is  not in acute distress.    Appearance: She is well-developed.  HENT:     Head: Normocephalic and atraumatic.     Mouth/Throat:     Mouth: Mucous membranes are moist.  Eyes:     Conjunctiva/sclera: Conjunctivae normal.  Cardiovascular:     Rate and Rhythm: Normal rate and regular rhythm.     Heart sounds: Normal heart sounds.  Pulmonary:     Effort: Pulmonary effort is normal. No respiratory distress.     Breath sounds: Normal breath sounds.  Abdominal:     Palpations: Abdomen is soft.     Tenderness: There is no abdominal tenderness. There is no right CVA tenderness, left CVA tenderness, guarding or rebound.  Musculoskeletal:        General: No swelling, tenderness or deformity. Normal range of motion.     Cervical back: Neck supple.  Skin:    General: Skin is warm and dry.     Capillary Refill: Capillary refill takes less than 2 seconds.     Findings: No bruising, erythema, lesion or rash.  Neurological:     General: No focal deficit present.     Mental Status: She is alert and oriented to person, place, and time.     Sensory: No sensory deficit.     Motor: No weakness.     Gait: Gait normal.  Psychiatric:        Mood and Affect: Mood normal.        Behavior: Behavior normal.     UC Treatments / Results  Labs (all labs ordered are listed, but only abnormal results are displayed) Labs Reviewed - No data to display  EKG   Radiology No results found.  Procedures Procedures (including critical care time)  Medications Ordered in UC Medications - No data to display  Initial Impression / Assessment and Plan / UC Course  I have reviewed the triage vital signs and the nursing notes.  Pertinent labs & imaging results that were available during my care of the patient were reviewed by me and considered in my medical decision making (see chart for details).   Acute left lower back pain with left side sciatica.  Treating with ibuprofen and Flexeril.  Precautions for  drowsiness with Flexeril discussed.  Instructed patient to follow-up with her PCP or an orthopedist if her symptoms or not improving.  She agrees to plan of care.   Final Clinical Impressions(s) / UC Diagnoses   Final diagnoses:  Acute left-sided low back pain with left-sided sciatica     Discharge Instructions      Take the ibuprofen as needed for discomfort.  Take the muscle relaxer as needed for muscle spasm; Do not drive, operate machinery, or drink alcohol with this medication as it can cause drowsiness.   Follow up with your primary care provider or an orthopedist if your symptoms are not improving.         ED Prescriptions     Medication Sig Dispense Auth. Provider   ibuprofen (ADVIL) 600 MG tablet Take 1 tablet (600 mg total) by mouth every 6 (six) hours as needed. 30 tablet Sharion Balloon, NP   cyclobenzaprine (FLEXERIL) 10 MG tablet Take 1 tablet (10 mg total) by mouth 2 (two) times daily as needed for muscle spasms. 20 tablet Sharion Balloon, NP      PDMP not reviewed this encounter.   Sharion Balloon, NP 06/12/21 [phone]

## 2021-06-12 NOTE — ED Notes (Signed)
Pt here with left lumbar back pain x 2 days.

## 2021-06-16 ENCOUNTER — Ambulatory Visit: Payer: 59

## 2021-06-17 ENCOUNTER — Ambulatory Visit: Payer: 59 | Admitting: Family Medicine

## 2021-06-29 ENCOUNTER — Other Ambulatory Visit: Payer: Self-pay

## 2021-07-02 ENCOUNTER — Encounter: Payer: Self-pay | Admitting: Family Medicine

## 2021-07-08 ENCOUNTER — Other Ambulatory Visit: Payer: Self-pay

## 2021-08-02 ENCOUNTER — Other Ambulatory Visit: Payer: Self-pay

## 2021-08-02 ENCOUNTER — Ambulatory Visit: Payer: 59 | Admitting: Family Medicine

## 2021-08-02 ENCOUNTER — Encounter: Payer: Self-pay | Admitting: Family Medicine

## 2021-08-02 VITALS — BP 126/86 | HR 78 | Temp 97.6°F | Resp 16 | Ht 67.0 in | Wt 217.0 lb

## 2021-08-02 DIAGNOSIS — F419 Anxiety disorder, unspecified: Secondary | ICD-10-CM

## 2021-08-02 DIAGNOSIS — R7303 Prediabetes: Secondary | ICD-10-CM | POA: Insufficient documentation

## 2021-08-02 DIAGNOSIS — Z6835 Body mass index (BMI) 35.0-35.9, adult: Secondary | ICD-10-CM | POA: Diagnosis not present

## 2021-08-02 DIAGNOSIS — E669 Obesity, unspecified: Secondary | ICD-10-CM | POA: Diagnosis not present

## 2021-08-02 DIAGNOSIS — E7800 Pure hypercholesterolemia, unspecified: Secondary | ICD-10-CM | POA: Diagnosis not present

## 2021-08-02 LAB — POCT GLYCOSYLATED HEMOGLOBIN (HGB A1C)
Est. average glucose Bld gHb Est-mCnc: 114
Hemoglobin A1C: 5.6 % (ref 4.0–5.6)

## 2021-08-02 MED ORDER — OZEMPIC (0.25 OR 0.5 MG/DOSE) 2 MG/1.5ML ~~LOC~~ SOPN
0.5000 mg | PEN_INJECTOR | SUBCUTANEOUS | 3 refills | Status: DC
  Filled 2021-08-02: qty 4.5, 84d supply, fill #0
  Filled 2021-09-29: qty 4.5, 84d supply, fill #1

## 2021-08-02 MED ORDER — BUSPIRONE HCL 7.5 MG PO TABS
7.5000 mg | ORAL_TABLET | Freq: Two times a day (BID) | ORAL | 1 refills | Status: DC
  Filled 2021-08-02: qty 60, 30d supply, fill #0

## 2021-08-02 MED ORDER — FLUOXETINE HCL 20 MG PO CAPS
40.0000 mg | ORAL_CAPSULE | Freq: Every day | ORAL | 1 refills | Status: DC
  Filled 2021-08-02: qty 180, 90d supply, fill #0

## 2021-08-02 NOTE — Progress Notes (Signed)
Established patient visit   Patient: Deborah Jenkins   DOB: 07/26/1956   65 y.o. Female  MRN: [id number] Visit Date: 08/02/2021  Today's healthcare provider: Shirlee Latch, MD   Chief Complaint  Patient presents with   Anxiety   Hyperglycemia   Obesity   Subjective     Anxiety - Taking Prozac 20mg -40mg  daily instead of 60mg  - Worried about emotional blunting due to increasing dose of Prozac - Stress of work, holidays, and taking care of father has "pushed her over the edge" - GAD7 score 13  Prediabetes - A1C down to 5.6 - Hasn't taken Ozepmic in 2 weeks because she ran out of medication  Hyperlipidemia - Taking rosuvastatin daily, tolerating well  Iron deficiency anemia - monitored by hematology  Medications: Outpatient Medications Prior to Visit  Medication Sig   albuterol (VENTOLIN HFA) 108 (90 Base) MCG/ACT inhaler Inhale 2 puffs into the lungs every 4 (four) hours as needed for wheezing or shortness of breath.   cholecalciferol (VITAMIN D3) 25 MCG (1000 UNIT) tablet Take 1,000 Units by mouth daily.   cyclobenzaprine (FLEXERIL) 10 MG tablet Take 1 tablet (10 mg total) by mouth 2 (two) times daily as needed for muscle spasms.   fluticasone (FLONASE) 50 MCG/ACT nasal spray Place 1 spray into both nostrils daily as needed for allergies or rhinitis.   hydrocortisone cream 0.5 % Apply 1 application topically daily as needed for itching (on elbows).   ibuprofen (ADVIL) 600 MG tablet Take 1 tablet (600 mg total) by mouth every 6 (six) hours as needed.   Melatonin 10 MG TABS Take 10 mg by mouth at bedtime.   NON FORMULARY Viviscal/ For hair loss   omeprazole (PRILOSEC) 20 MG capsule TAKE 1 CAPSULE BY MOUTH ONCE A DAY   rosuvastatin (CRESTOR) 10 MG tablet Take 1 tablet (10 mg total) by mouth daily.   [DISCONTINUED] FLUoxetine (PROZAC) 20 MG capsule Take 3 capsules (60 mg total) by mouth daily. (Patient taking differently: Take 20 mg by mouth daily.)    [DISCONTINUED] Semaglutide,0.25 or 0.5MG /DOS, (OZEMPIC, 0.25 OR 0.5 MG/DOSE,) 2 MG/1.5ML SOPN Inject 0.25 mg into the skin once a week for 14 days, THEN 0.5 mg once a week for 28 days.   No facility-administered medications prior to visit.    Review of Systems  Constitutional: Negative.   HENT: Negative.    Psychiatric/Behavioral:  The patient is nervous/anxious.       Objective    BP 126/86 (BP Location: Right Arm, Patient Position: Sitting, Cuff Size: Large)   Pulse 78   Temp 97.6 F (36.4 C) (Temporal)   Resp 16   Ht 5\' 7"  (1.702 m)   Wt 217 lb (98.4 kg)   SpO2 98%   BMI 33.99 kg/m  {Show previous vital signs (optional):23777}  Physical Exam Vitals reviewed.  Constitutional:      General: She is not in acute distress.    Appearance: Normal appearance. She is not ill-appearing or toxic-appearing.  HENT:     Head: Normocephalic and atraumatic.     Right Ear: External ear normal.     Left Ear: External ear normal.     Nose: Nose normal.     Mouth/Throat:     Mouth: Mucous membranes are moist.     Pharynx: Oropharynx is clear. No oropharyngeal exudate or posterior oropharyngeal erythema.  Eyes:     General: No scleral icterus.    Extraocular Movements: Extraocular movements intact.  Conjunctiva/sclera: Conjunctivae normal.     Pupils: Pupils are equal, round, and reactive to light.  Cardiovascular:     Rate and Rhythm: Normal rate and regular rhythm.     Pulses: Normal pulses.     Heart sounds: Normal heart sounds. No murmur heard.   No friction rub. No gallop.  Pulmonary:     Effort: Pulmonary effort is normal. No respiratory distress.     Breath sounds: Normal breath sounds. No wheezing or rhonchi.  Chest:     Chest wall: No tenderness.  Abdominal:     General: Abdomen is flat. There is no distension.     Palpations: Abdomen is soft.     Tenderness: There is no abdominal tenderness.  Musculoskeletal:        General: Normal range of motion.     Cervical  back: Normal range of motion and neck supple.     Right lower leg: No edema.     Left lower leg: No edema.  Skin:    General: Skin is warm and dry.     Capillary Refill: Capillary refill takes less than 2 seconds.     Findings: No lesion or rash.  Neurological:     General: No focal deficit present.     Mental Status: She is alert and oriented to person, place, and time. Mental status is at baseline.  Psychiatric:        Mood and Affect: Mood normal.      Results for orders placed or performed in visit on 08/02/21  POCT glycosylated hemoglobin (Hb A1C)  Result Value Ref Range   Hemoglobin A1C 5.6 4.0 - 5.6 %   Est. average glucose Bld gHb Est-mCnc 114     Assessment & Plan     Problem List Items Addressed This Visit       Other   Anxiety - Primary    Uncontrolled Continue Prozac 40mg  daily - will not increase to 60 as it gives flat affect Add Buspar 7.5mg  daily F/u in 3 months      Relevant Medications   busPIRone (BUSPAR) 7.5 MG tablet   FLUoxetine (PROZAC) 20 MG capsule   Hyperlipidemia    Continue rosuvastatin  Recheck lipid panel      Relevant Orders   Lipid panel   Obesity    Continue Ozempic Discussed importance of healthy weight management Discussed diet and exercise      Relevant Medications   Semaglutide,0.25 or 0.5MG /DOS, (OZEMPIC, 0.25 OR 0.5 MG/DOSE,) 2 MG/1.5ML SOPN   Prediabetes    Recommend low carb diet A1c back to normal range      Relevant Medications   Semaglutide,0.25 or 0.5MG /DOS, (OZEMPIC, 0.25 OR 0.5 MG/DOSE,) 2 MG/1.5ML SOPN   Other Relevant Orders   POCT glycosylated hemoglobin (Hb A1C) (Completed)     Return in about 3 months (around 11/02/2021) for chronic disease f/u.      Gwyndolyn Kaufman, Medical Student 08/02/2021, 1:06 PM    Patient seen along with MS3 student Gwyndolyn Kaufman. I personally evaluated this patient along with the student, and verified all aspects of the history, physical exam, and medical decision making as  documented by the student. I agree with the student's documentation and have made all necessary edits.  Bronte Sabado, Marzella Schlein, MD, MPH [facility]

## 2021-08-02 NOTE — Assessment & Plan Note (Addendum)
Uncontrolled Continue Prozac 40mg  daily - will not increase to 60 as it gives flat affect Add Buspar 7.5mg  daily F/u in 3 months

## 2021-08-02 NOTE — Assessment & Plan Note (Addendum)
Recommend low carb diet A1c back to normal range

## 2021-08-02 NOTE — Assessment & Plan Note (Signed)
Continue rosuvastatin  Recheck lipid panel

## 2021-08-02 NOTE — Assessment & Plan Note (Signed)
Continue Ozempic Discussed importance of healthy weight management Discussed diet and exercise

## 2021-08-03 LAB — LIPID PANEL
Chol/HDL Ratio: 2.3 ratio (ref 0.0–4.4)
Cholesterol, Total: 159 mg/dL (ref 100–199)
HDL: 69 mg/dL (ref >39)
LDL Chol Calc (NIH): 78 mg/dL (ref 0–99)
Triglycerides: 58 mg/dL (ref 0–149)
VLDL Cholesterol Cal: 12 mg/dL (ref 5–40)

## 2021-08-23 ENCOUNTER — Inpatient Hospital Stay: Payer: 59

## 2021-08-25 ENCOUNTER — Inpatient Hospital Stay: Payer: 59 | Admitting: Oncology

## 2021-08-25 ENCOUNTER — Inpatient Hospital Stay: Payer: 59

## 2021-09-16 ENCOUNTER — Other Ambulatory Visit: Payer: Self-pay | Admitting: *Deleted

## 2021-09-16 DIAGNOSIS — D509 Iron deficiency anemia, unspecified: Secondary | ICD-10-CM

## 2021-09-22 ENCOUNTER — Inpatient Hospital Stay: Payer: 59 | Attending: Oncology

## 2021-09-22 ENCOUNTER — Other Ambulatory Visit: Payer: Self-pay

## 2021-09-22 DIAGNOSIS — D509 Iron deficiency anemia, unspecified: Secondary | ICD-10-CM | POA: Diagnosis not present

## 2021-09-22 DIAGNOSIS — R5383 Other fatigue: Secondary | ICD-10-CM

## 2021-09-22 LAB — CBC WITH DIFFERENTIAL/PLATELET
Abs Immature Granulocytes: 0.01 10*3/uL (ref 0.00–0.07)
Basophils Absolute: 0 10*3/uL (ref 0.0–0.1)
Basophils Relative: 1 %
Eosinophils Absolute: 0.1 10*3/uL (ref 0.0–0.5)
Eosinophils Relative: 1 %
HCT: 40.9 % (ref 36.0–46.0)
Hemoglobin: 13.2 g/dL (ref 12.0–15.0)
Immature Granulocytes: 0 %
Lymphocytes Relative: 38 %
Lymphs Abs: 2.5 10*3/uL (ref 0.7–4.0)
MCH: 28.3 pg (ref 26.0–34.0)
MCHC: 32.3 g/dL (ref 30.0–36.0)
MCV: 87.8 fL (ref 80.0–100.0)
Monocytes Absolute: 0.4 10*3/uL (ref 0.1–1.0)
Monocytes Relative: 6 %
Neutro Abs: 3.4 10*3/uL (ref 1.7–7.7)
Neutrophils Relative %: 54 %
Platelets: 317 10*3/uL (ref 150–400)
RBC: 4.66 MIL/uL (ref 3.87–5.11)
RDW: 13.5 % (ref 11.5–15.5)
WBC: 6.5 10*3/uL (ref 4.0–10.5)
nRBC: 0 % (ref 0.0–0.2)

## 2021-09-22 LAB — IRON AND TIBC
Iron: 51 ug/dL (ref 28–170)
Saturation Ratios: 15 % (ref 10.4–31.8)
TIBC: 337 ug/dL (ref 250–450)
UIBC: 286 ug/dL

## 2021-09-22 LAB — COMPREHENSIVE METABOLIC PANEL
ALT: 27 U/L (ref 0–44)
AST: 25 U/L (ref 15–41)
Albumin: 4.1 g/dL (ref 3.5–5.0)
Alkaline Phosphatase: 65 U/L (ref 38–126)
Anion gap: 11 (ref 5–15)
BUN: 12 mg/dL (ref 8–23)
CO2: 20 mmol/L — ABNORMAL LOW (ref 22–32)
Calcium: 8.8 mg/dL — ABNORMAL LOW (ref 8.9–10.3)
Chloride: 106 mmol/L (ref 98–111)
Creatinine, Ser: 0.99 mg/dL (ref 0.44–1.00)
GFR, Estimated: >60 mL/min (ref >60)
Glucose, Bld: 112 mg/dL — ABNORMAL HIGH (ref 70–99)
Potassium: 3.8 mmol/L (ref 3.5–5.1)
Sodium: 137 mmol/L (ref 135–145)
Total Bilirubin: 0.5 mg/dL (ref 0.3–1.2)
Total Protein: 7.2 g/dL (ref 6.5–8.1)

## 2021-09-22 LAB — FERRITIN: Ferritin: 24 ng/mL (ref 11–307)

## 2021-09-22 LAB — VITAMIN B12: Vitamin B-12: 529 pg/mL (ref 180–914)

## 2021-09-23 ENCOUNTER — Inpatient Hospital Stay: Payer: 59

## 2021-09-23 ENCOUNTER — Inpatient Hospital Stay (HOSPITAL_BASED_OUTPATIENT_CLINIC_OR_DEPARTMENT_OTHER): Payer: 59 | Admitting: Oncology

## 2021-09-23 ENCOUNTER — Encounter: Payer: Self-pay | Admitting: Oncology

## 2021-09-23 ENCOUNTER — Inpatient Hospital Stay: Payer: 59 | Admitting: Oncology

## 2021-09-23 DIAGNOSIS — D509 Iron deficiency anemia, unspecified: Secondary | ICD-10-CM | POA: Diagnosis not present

## 2021-09-23 NOTE — Progress Notes (Signed)
[facility] TeleHEALTH VISIT PROGRESS NOTE  I connected with Deborah Jenkins on 09/23/21  at  2:30 PM EST by video enabled telemedicine visit and verified that I am speaking with the correct person using two identifiers. I discussed the limitations, risks, security and privacy concerns of performing an evaluation and management service by telemedicine and the availability of in-person appointments. The patient expressed understanding and agreed to proceed.   Other persons participating in the visit and their role in the encounter:  None  Patient's location: Home  Provider's location: office Chief Complaint: Follow-up for iron deficiency anemia.   INTERVAL HISTORY Deborah Jenkins is a 66 y.o. female who has above history reviewed by me today presents for follow up visit for management of iron deficiency anemia. Today patient reports feeling well.  Denies any blood in the stool.  No new complaints. Review of Systems  Constitutional:  Negative for appetite change, chills, fatigue and fever.  HENT:   Negative for hearing loss and voice change.   Eyes:  Negative for eye problems.  Respiratory:  Negative for chest tightness and cough.   Cardiovascular:  Negative for chest pain.  Gastrointestinal:  Negative for abdominal distention, abdominal pain and blood in stool.  Endocrine: Negative for hot flashes.  Genitourinary:  Negative for difficulty urinating and frequency.   Musculoskeletal:  Negative for arthralgias.  Skin:  Negative for itching and rash.  Neurological:  Negative for extremity weakness.  Hematological:  Negative for adenopathy.  Psychiatric/Behavioral:  Negative for confusion.    Past Medical History:  Diagnosis Date   Depression 2006   GERD (gastroesophageal reflux disease) 2006   Iron deficiency anemia 01/04/2021   Past Surgical History:  Procedure Laterality Date   COLONOSCOPY WITH PROPOFOL N/A 12/09/2020   Procedure: COLONOSCOPY WITH PROPOFOL;  Surgeon: Virgel Manifold, MD;  Location: [facility]COPY;  Service: Endoscopy;  Laterality: N/A;   ESOPHAGOGASTRODUODENOSCOPY (EGD) WITH PROPOFOL N/A 12/09/2020   Procedure: ESOPHAGOGASTRODUODENOSCOPY (EGD) WITH PROPOFOL;  Surgeon: Virgel Manifold, MD;  Location: [facility];  Service: Endoscopy;  Laterality: N/A;   FRACTURE SURGERY     TONSILLECTOMY Bilateral Childhood   TUBAL LIGATION     WRIST FRACTURE SURGERY Right    in childhood    Family History  Problem Relation Age of Onset   Hypertension Mother    Diverticulosis Mother    Healthy Father    Hypertension Sister    Healthy Sister    Healthy Brother    Healthy Sister    Alcohol abuse Brother    Alcohol abuse Brother    Cirrhosis Brother    Cirrhosis Brother    Colon cancer Neg Hx    Breast cancer Neg Hx    Ovarian cancer Neg Hx    Cervical cancer Neg Hx     Social History   Socioeconomic History   Marital status: Single    Spouse name: Not on file   Number of children: 2   Years of education: 14   Highest education level: Associate degree: academic program  Occupational History   Occupation: Ship broker: [employer]    Comment: [employer]  Tobacco Use   Smoking status: Every Day    Packs/day: 0.30    Years: 20.00    Pack years: 6.00    Types: Cigarettes   Smokeless tobacco: Never   Tobacco comments:    started smoking at 66 YO; is smoking 1/3 PPD  Vaping Use   Vaping Use: Never  used  Substance and Sexual Activity   Alcohol use: No    Alcohol/week: 0.0 standard drinks   Drug use: No   Sexual activity: Yes    Birth control/protection: Post-menopausal  Other Topics Concern   Not on file  Social History Narrative      Social Determinants of Health   Financial Resource Strain: Not on file  Food Insecurity: Not on file  Transportation Needs: Not on file  Physical Activity: Not on file  Stress: Not on file  Social Connections: Not on file  Intimate Partner Violence: Not on file    Current  Outpatient Medications on File Prior to Visit  Medication Sig Dispense Refill   albuterol (VENTOLIN HFA) 108 (90 Base) MCG/ACT inhaler Inhale 2 puffs into the lungs every 4 (four) hours as needed for wheezing or shortness of breath. 18 g 0   busPIRone (BUSPAR) 7.5 MG tablet Take 1 tablet (7.5 mg total) by mouth 2 (two) times daily. 60 tablet 1   cholecalciferol (VITAMIN D3) 25 MCG (1000 UNIT) tablet Take 1,000 Units by mouth daily.     cyclobenzaprine (FLEXERIL) 10 MG tablet Take 1 tablet (10 mg total) by mouth 2 (two) times daily as needed for muscle spasms. 20 tablet 0   FLUoxetine (PROZAC) 20 MG capsule Take 2 capsules (40 mg total) by mouth daily. 180 capsule 1   fluticasone (FLONASE) 50 MCG/ACT nasal spray Place 1 spray into both nostrils daily as needed for allergies or rhinitis. 16 g 11   hydrocortisone cream 0.5 % Apply 1 application topically daily as needed for itching (on elbows).     ibuprofen (ADVIL) 600 MG tablet Take 1 tablet (600 mg total) by mouth every 6 (six) hours as needed. 30 tablet 0   Melatonin 10 MG TABS Take 10 mg by mouth at bedtime.     NON FORMULARY Viviscal/ For hair loss     omeprazole (PRILOSEC) 20 MG capsule TAKE 1 CAPSULE BY MOUTH ONCE A DAY 90 capsule 1   rosuvastatin (CRESTOR) 10 MG tablet Take 1 tablet (10 mg total) by mouth daily. 90 tablet 3   Semaglutide,0.25 or 0.5MG /DOS, (OZEMPIC, 0.25 OR 0.5 MG/DOSE,) 2 MG/1.5ML SOPN Inject 0.5 mg into the skin once a week. 4.5 mL 3   No current facility-administered medications on file prior to visit.    Allergies  Allergen Reactions   Atorvastatin     Hair loss       Observations/Objective: Today's Vitals   09/23/21 1417 09/23/21 1418  PainSc: 0-No pain 0-No pain   There is no height or weight on file to calculate BMI.  Physical Exam Neurological:     Mental Status: She is alert.    CBC    Component Value Date/Time   WBC 6.5 09/22/2021 1458   RBC 4.66 09/22/2021 1458   HGB 13.2 09/22/2021 1458    HGB 9.7 (L) 12/30/2020 1623   HCT 40.9 09/22/2021 1458   HCT 32.2 (L) 12/30/2020 1623   PLT 317 09/22/2021 1458   PLT 443 12/30/2020 1623   MCV 87.8 09/22/2021 1458   MCV 81 12/30/2020 1623   MCH 28.3 09/22/2021 1458   MCHC 32.3 09/22/2021 1458   RDW 13.5 09/22/2021 1458   RDW 14.3 12/30/2020 1623   LYMPHSABS 2.5 09/22/2021 1458   LYMPHSABS 2.6 12/30/2020 1623   MONOABS 0.4 09/22/2021 1458   EOSABS 0.1 09/22/2021 1458   EOSABS 0.1 12/30/2020 1623   BASOSABS 0.0 09/22/2021 1458   BASOSABS 0.1 12/30/2020  1623    CMP     Component Value Date/Time   NA 137 09/22/2021 1458   NA 139 03/29/2021 1026   K 3.8 09/22/2021 1458   CL 106 09/22/2021 1458   CO2 20 (L) 09/22/2021 1458   GLUCOSE 112 (H) 09/22/2021 1458   BUN 12 09/22/2021 1458   BUN 9 03/29/2021 1026   CREATININE 0.99 09/22/2021 1458   CALCIUM 8.8 (L) 09/22/2021 1458   PROT 7.2 09/22/2021 1458   PROT 6.9 03/29/2021 1026   ALBUMIN 4.1 09/22/2021 1458   ALBUMIN 4.6 03/29/2021 1026   AST 25 09/22/2021 1458   ALT 27 09/22/2021 1458   ALKPHOS 65 09/22/2021 1458   BILITOT 0.5 09/22/2021 1458   BILITOT <0.2 03/29/2021 1026   GFRNONAA >60 09/22/2021 1458   GFRAA 86 03/25/2020 1052     Assessment and Plan: 1. Iron deficiency anemia, unspecified iron deficiency anemia type     Labs reviewed and discussed with patient. Patient has normal iron panel and hemoglobin level. No need for IV Venofer treatments. Patient may take multivitamin that contains iron as maintenance.   Follow Up Instructions: Follow-up in a year.   I discussed the assessment and treatment plan with the patient. The patient was provided an opportunity to ask questions and all were answered. The patient agreed with the plan and demonstrated an understanding of the instructions.  The patient was advised to call back or seek an in-person evaluation if the symptoms worsen or if the condition fails to improve as anticipated.   Earlie Server, MD 09/23/2021 8:57  PM

## 2021-09-27 ENCOUNTER — Inpatient Hospital Stay: Payer: 59

## 2021-09-29 ENCOUNTER — Other Ambulatory Visit: Payer: Self-pay

## 2021-10-01 ENCOUNTER — Other Ambulatory Visit: Payer: Self-pay

## 2021-10-06 ENCOUNTER — Other Ambulatory Visit: Payer: Self-pay

## 2021-10-06 ENCOUNTER — Other Ambulatory Visit: Payer: Self-pay | Admitting: Family Medicine

## 2021-10-06 DIAGNOSIS — K2100 Gastro-esophageal reflux disease with esophagitis, without bleeding: Secondary | ICD-10-CM

## 2021-10-06 MED ORDER — OMEPRAZOLE 20 MG PO CPDR
DELAYED_RELEASE_CAPSULE | Freq: Every day | ORAL | 0 refills | Status: DC
  Filled 2021-10-06: qty 90, 90d supply, fill #0

## 2021-10-06 NOTE — Telephone Encounter (Signed)
Requested Prescriptions  Pending Prescriptions Disp Refills   omeprazole (PRILOSEC) 20 MG capsule 90 capsule 0    Sig: TAKE 1 CAPSULE BY MOUTH ONCE A DAY     Gastroenterology: Proton Pump Inhibitors Passed - 10/06/2021  6:24 AM      Passed - Valid encounter within last 12 months    Recent Outpatient Visits          2 months ago [place], Dionne Bucy, MD   6 months ago Visit for annual health examination   [facility] [place], Dionne Bucy, MD   10 months ago Acute lower GI bleeding   [facility] [place], [place]   1 year ago Pure hypercholesterolemia   [facility] [place], Wendee Bea[place], [place]   1 year ago Annual physical exam   [facility] [place], [place]      Future Appointments            In 1 month [place], Dionne Bucy, MD [facility], [place]   In 5 months Bacigalupo, Dionne Bucy, MD [facility], [place]

## 2021-10-08 ENCOUNTER — Other Ambulatory Visit: Payer: Self-pay

## 2021-10-22 ENCOUNTER — Ambulatory Visit
Admission: EM | Admit: 2021-10-22 | Discharge: 2021-10-22 | Disposition: A | Discharge status: Home / Self Care | Payer: 59 | Attending: Family Medicine | Admitting: Family Medicine

## 2021-10-22 ENCOUNTER — Encounter: Payer: Self-pay | Admitting: Emergency Medicine

## 2021-10-22 ENCOUNTER — Other Ambulatory Visit: Payer: Self-pay

## 2021-10-22 DIAGNOSIS — T148XXA Other injury of unspecified body region, initial encounter: Secondary | ICD-10-CM

## 2021-10-22 DIAGNOSIS — Z23 Encounter for immunization: Secondary | ICD-10-CM | POA: Diagnosis not present

## 2021-10-22 MED ORDER — TETANUS-DIPHTH-ACELL PERTUSSIS 5-2.5-18.5 LF-MCG/0.5 IM SUSY
0.5000 mL | PREFILLED_SYRINGE | Freq: Once | INTRAMUSCULAR | Status: AC
  Administered 2021-10-22: 0.5 mL via INTRAMUSCULAR

## 2021-10-22 NOTE — ED Provider Notes (Addendum)
Roderic Palau    CSN: [id number] Arrival date & time: 10/22/21  0907      History   Chief Complaint Chief Complaint  Patient presents with   Laceration   Immunizations    HPI Deborah Jenkins is a 66 y.o. female.   HPI Patient presents today with a severe visible laceration to the tip of her right index finger.  Patient reports moving objects and subsequently scraping her finger against a metal bar.  She needs a Tdap vaccination. Right index finger is not bleeding.  She has not secured an bandage.  Past Medical History:  Diagnosis Date   Depression 2006   GERD (gastroesophageal reflux disease) 2006   Iron deficiency anemia 01/04/2021    Patient Active Problem List   Diagnosis Date Noted   Prediabetes 08/02/2021   Iron deficiency anemia 01/04/2021   Other dysphagia    Gastric nodule    Atrophic gastritis without hemorrhage    Colon cancer screening    Polyp of colon    Diverticulosis of colon with hemorrhage    Diverticulosis 11/14/2020   Acute blood loss anemia 11/14/2020   Acute lower GI bleeding 11/13/2020   Obesity 03/22/2019   Psoriasis 03/22/2019   Anxiety 04/06/2018   Hyperlipidemia 04/06/2018   Tobacco use disorder 01/10/2018   GERD (gastroesophageal reflux disease) 01/10/2018   Allergic rhinitis 08/21/2015   Depression 02/19/2014    Past Surgical History:  Procedure Laterality Date   COLONOSCOPY WITH PROPOFOL N/A 12/09/2020   Procedure: COLONOSCOPY WITH PROPOFOL;  Surgeon: Virgel Manifold, MD;  Location: [facility] ENDOSCOPY;  Service: Endoscopy;  Laterality: N/A;   ESOPHAGOGASTRODUODENOSCOPY (EGD) WITH PROPOFOL N/A 12/09/2020   Procedure: ESOPHAGOGASTRODUODENOSCOPY (EGD) WITH PROPOFOL;  Surgeon: Virgel Manifold, MD;  Location: [facility] ENDOSCOPY;  Service: Endoscopy;  Laterality: N/A;   FRACTURE SURGERY     TONSILLECTOMY Bilateral Childhood   TUBAL LIGATION     WRIST FRACTURE SURGERY Right    in childhood    OB History   No obstetric  history on file.      Home Medications    Prior to Admission medications   Medication Sig Start Date End Date Taking? Authorizing Provider  albuterol (VENTOLIN HFA) 108 (90 Base) MCG/ACT inhaler Inhale 2 puffs into the lungs every 4 (four) hours as needed for wheezing or shortness of breath. 04/27/20   Sharion Balloon, NP  busPIRone (BUSPAR) 7.5 MG tablet Take 1 tablet (7.5 mg total) by mouth 2 (two) times daily. 08/02/21   Virginia Crews, MD  cholecalciferol (VITAMIN D3) 25 MCG (1000 UNIT) tablet Take 1,000 Units by mouth daily.    [provider]  cyclobenzaprine (FLEXERIL) 10 MG tablet Take 1 tablet (10 mg total) by mouth 2 (two) times daily as needed for muscle spasms. 06/12/21   Sharion Balloon, NP  FLUoxetine (PROZAC) 20 MG capsule Take 2 capsules (40 mg total) by mouth daily. 08/02/21   Virginia Crews, MD  fluticasone (FLONASE) 50 MCG/ACT nasal spray Place 1 spray into both nostrils daily as needed for allergies or rhinitis. 03/29/21   Virginia Crews, MD  hydrocortisone cream 0.5 % Apply 1 application topically daily as needed for itching (on elbows).    [provider]  ibuprofen (ADVIL) 600 MG tablet Take 1 tablet (600 mg total) by mouth every 6 (six) hours as needed. 06/12/21   Sharion Balloon, NP  Melatonin 10 MG TABS Take 10 mg by mouth at bedtime.    [provider]  NON FORMULARY Viviscal/ For hair loss    [provider]  omeprazole (PRILOSEC) 20 MG capsule TAKE 1 CAPSULE BY MOUTH ONCE A DAY 10/06/21 10/06/22  Virginia Crews, MD  rosuvastatin (CRESTOR) 10 MG tablet Take 1 tablet (10 mg total) by mouth daily. 03/29/21   Virginia Crews, MD  Semaglutide,0.25 or 0.5MG /DOS, (OZEMPIC, 0.25 OR 0.5 MG/DOSE,) 2 MG/1.5ML SOPN Inject 0.5 mg into the skin once a week. 08/02/21   Virginia Crews, MD    Family History Family History  Problem Relation Age of Onset   Hypertension Mother    Diverticulosis Mother    Healthy Father     Hypertension Sister    Healthy Sister    Healthy Brother    Healthy Sister    Alcohol abuse Brother    Alcohol abuse Brother    Cirrhosis Brother    Cirrhosis Brother    Colon cancer Neg Hx    Breast cancer Neg Hx    Ovarian cancer Neg Hx    Cervical cancer Neg Hx     Social History Social History   Tobacco Use   Smoking status: Every Day    Packs/day: 0.30    Years: 20.00    Pack years: 6.00    Types: Cigarettes   Smokeless tobacco: Never   Tobacco comments:    started smoking at 66 YO; is smoking 1/3 PPD  Vaping Use   Vaping Use: Never used  Substance Use Topics   Alcohol use: No    Alcohol/week: 0.0 standard drinks   Drug use: No     Allergies   Atorvastatin   Review of Systems Review of Systems Pertinent negatives listed in HPI   Physical Exam Triage Vital Signs ED Triage Vitals  Enc Vitals Group     BP 10/22/21 0919 120/87     Pulse Rate 10/22/21 0919 90     Resp 10/22/21 0919 16     Temp 10/22/21 0919 99.1 F (37.3 C)     Temp Source 10/22/21 0919 Oral     SpO2 10/22/21 0919 95 %     Weight --      Height --      Head Circumference --      Peak Flow --      Pain Score 10/22/21 0922 0     Pain Loc --      Pain Edu? --      Excl. in [place]? --    No data found.  Updated Vital Signs BP 120/87 (BP Location: Left Arm)    Pulse 90    Temp 99.1 F (37.3 C) (Oral)    Resp 16    SpO2 95%   Visual Acuity Right Eye Distance:   Left Eye Distance:   Bilateral Distance:    Right Eye Near:   Left Eye Near:    Bilateral Near:     Physical Exam General appearance: alert, well developed, well nourished, cooperative and in no distress Head: Normocephalic, without obvious abnormality, atraumatic Respiratory: Respirations even and unlabored, normal respiratory rate Heart: Rate and rhythm normal. No gallop or murmurs noted on exam  Extremities: No gross deformities Skin: Right index finger tip (non bleeding) 2.5 cm  Psych: Appropriate mood and  affect.   UC Treatments / Results  Labs (all labs ordered are listed, but only abnormal results are displayed) Labs Reviewed - No data to display  EKG   Radiology No results found.  Procedures Procedures (  including critical care time)  Medications Ordered in UC Medications  Tdap (BOOSTRIX) injection 0.5 mL (0.5 mLs Intramuscular Given 10/22/21 0923)    Initial Impression / Assessment and Plan / UC Course  I have reviewed the triage vital signs and the nursing notes.  Pertinent labs & imaging results that were available during my care of the patient were reviewed by me and considered in my medical decision making (see chart for details).     Superficial laceration, no additional interventions warranted.  Tetanus updated.  Monitor for infection.  Return as needed. Final Clinical Impressions(s) / UC Diagnoses   Final diagnoses:  Superficial laceration  Need for diphtheria-tetanus-pertussis (Tdap) vaccine   Discharge Instructions   None    ED Prescriptions   None    PDMP not reviewed this encounter.   Scot Jun, FNP 10/22/21 [place], [place], [place] 10/22/21 1054

## 2021-10-22 NOTE — ED Triage Notes (Signed)
Pt cut right index finger this morning on a metal rod. Bleeding is controlled. Pt needs updated Tdap

## 2021-11-04 NOTE — Progress Notes (Signed)
?  ? ?I,Sulibeya S Dimas,acting as a scribe for Lavon Paganini, MD.,have documented all relevant documentation on the behalf of Lavon Paganini, MD,as directed by  Lavon Paganini, MD while in the presence of Lavon Paganini, MD. ? ? ?Established patient visit ? ? ?Patient: Deborah Jenkins   DOB: May 02, 1956   66 y.o. Female  MRN: [id number] ?Visit Date: 11/05/2021 ? ?Today's healthcare provider: Lavon Paganini, MD  ? ?Chief Complaint  ?Patient presents with  ? Anxiety  ? ?Subjective  ?  ?HPI  ?Anxiety, Follow-up ? ?She was last seen for anxiety 3 months ago. ?Changes made at last visit include continue Prozac 40mg  daily, add Buspar 7.5mg  daily. ?  ?She reports good compliance with treatment. ?She reports fair tolerance of treatment. ?She is having side effects. Feels like she is in "[place]ala land" ? ?She feels her anxiety is mild and Unchanged since last visit. ? ?Symptoms: ?No chest pain Yes difficulty concentrating  ?No dizziness Yes fatigue  ?No feelings of losing control No insomnia  ?No irritable No palpitations  ?No panic attacks No racing thoughts  ?No shortness of breath No sweating  ?No tremors/shakes   ? ?GAD-7 Results ?GAD-7 Generalized Anxiety Disorder Screening Tool 11/05/2021 08/02/2021 03/22/2019  ?1. Feeling Nervous, Anxious, or on Edge 1 1 2   ?2. Not Being Able to Stop or Control Worrying 0 1 2  ?3. Worrying Too Much About Different Things 1 3 3   ?4. Trouble Relaxing 0 3 2  ?5. Being So Restless it's Hard To Sit Still 1 3 2   ?6. Becoming Easily Annoyed or Irritable 0 0 0  ?7. Feeling Afraid As If Something Awful Might Happen 1 2 0  ?Total GAD-7 Score 4 13 11   ?Difficulty At Work, Home, or Getting  Along With Others? Not difficult at all Somewhat difficult Not difficult at all  ? ? ?PHQ-9 Scores ?PHQ9 SCORE ONLY 11/05/2021 08/02/2021 03/29/2021  ?PHQ-9 Total Score 7 15 10    ? ? ?--------------------------------------------------------------------------------------------------- ? ? ?Medications: ?Outpatient Medications Prior to Visit  ?Medication Sig  ? albuterol (VENTOLIN HFA) 108 (90 Base) MCG/ACT inhaler Inhale 2 puffs into the lungs every 4 (four) hours as needed for wheezing or shortness of breath.  ? cholecalciferol (VITAMIN D3) 25 MCG (1000 UNIT) tablet Take 1,000 Units by mouth daily.  ? fluticasone (FLONASE) 50 MCG/ACT nasal spray Place 1 spray into both nostrils daily as needed for allergies or rhinitis.  ? hydrocortisone cream 0.5 % Apply 1 application topically daily as needed for itching (on elbows).  ? ibuprofen (ADVIL) 600 MG tablet Take 1 tablet (600 mg total) by mouth every 6 (six) hours as needed.  ? Melatonin 10 MG TABS Take 10 mg by mouth at bedtime.  ? NON FORMULARY Viviscal/ For hair loss  ? omeprazole (PRILOSEC) 20 MG capsule TAKE 1 CAPSULE BY MOUTH ONCE A DAY  ? rosuvastatin (CRESTOR) 10 MG tablet Take 1 tablet (10 mg total) by mouth daily.  ? [DISCONTINUED] busPIRone (BUSPAR) 7.5 MG tablet Take 1 tablet (7.5 mg total) by mouth 2 (two) times daily.  ? [DISCONTINUED] FLUoxetine (PROZAC) 20 MG capsule Take 2 capsules (40 mg total) by mouth daily.  ? [DISCONTINUED] Semaglutide,0.25 or 0.5MG /DOS, (OZEMPIC, 0.25 OR 0.5 MG/DOSE,) 2 MG/1.5ML SOPN Inject 0.5 mg into the skin once a week.  ? [DISCONTINUED] cyclobenzaprine (FLEXERIL) 10 MG tablet Take 1 tablet (10 mg total) by mouth 2 (two) times daily as needed for muscle spasms.  ? ?No facility-administered medications prior to visit.  ? ? ?  Review of Systems per HPI ? ?  ?  Objective  ?  ?BP 129/87 (BP Location: Left Arm, Patient Position: Sitting, Cuff Size: Large)   Pulse 87   Temp 98 ?F (36.7 ?C) (Temporal)   Resp 16   Wt 206 lb 9.6 oz (93.7 kg)   BMI 32.36 kg/m?  ?BP Readings from Last 3 Encounters:  ?11/05/21 129/87  ?10/22/21 120/87  ?08/02/21 126/86  ? ?Wt Readings from Last 3 Encounters:  ?11/05/21 206 lb  9.6 oz (93.7 kg)  ?08/02/21 217 lb (98.4 kg)  ?03/29/21 226 lb (102.5 kg)  ? ?  ? ?Physical Exam ?Vitals reviewed.  ?Constitutional:   ?   General: She is not in acute distress. ?   Appearance: Normal appearance. She is well-developed. She is not diaphoretic.  ?HENT:  ?   Head: Normocephalic and atraumatic.  ?Eyes:  ?   General: No scleral icterus. ?   Conjunctiva/sclera: Conjunctivae normal.  ?Neck:  ?   Thyroid: No thyromegaly.  ?Cardiovascular:  ?   Rate and Rhythm: Normal rate and regular rhythm.  ?   Pulses: Normal pulses.  ?   Heart sounds: Normal heart sounds. No murmur heard. ?Pulmonary:  ?   Effort: Pulmonary effort is normal. No respiratory distress.  ?   Breath sounds: Normal breath sounds. No wheezing, rhonchi or rales.  ?Musculoskeletal:  ?   Cervical back: Neck supple.  ?   Right lower leg: No edema.  ?   Left lower leg: No edema.  ?Lymphadenopathy:  ?   Cervical: No cervical adenopathy.  ?Skin: ?   General: Skin is warm and dry.  ?   Findings: No rash.  ?Neurological:  ?   Mental Status: She is alert and oriented to person, place, and time. Mental status is at baseline.  ?Psychiatric:     ?   Mood and Affect: Mood normal.     ?   Behavior: Behavior normal.  ?  ? ? ?No results found for any visits on 11/05/21. ? Assessment & Plan  ?  ? ?Problem List Items Addressed This Visit   ? ?  ? Other  ? Depression  ?  Chronic and well controlled ?Increase prozac as below for anxiety ?  ?  ? Relevant Medications  ? FLUoxetine (PROZAC) 20 MG capsule  ? Anxiety - Primary  ?  Chronic and improving, but not well controlled ?Increase prozac to 60mg  daily - at last visit we discussed that this gave her emotional blunting, but now she states that she never got to this dose, so we will try it ?Did not tolerate buspar ?Encourage therapy - discussed synergism with meds ?  ?  ? Relevant Medications  ? FLUoxetine (PROZAC) 20 MG capsule  ? Obesity  ?  Congratulated on weight loss ?Increase ozempic to 1mg  daily ?Discussed  importance of healthy weight management ?Discussed diet and exercise  ?  ?  ? Relevant Medications  ? Semaglutide, 1 MG/DOSE, 4 MG/3ML SOPN  ? Prediabetes  ?  A1c back to normal ?On ozempic for weight management ?  ?  ?  ? ?Return in about 4 months (around 03/07/2022) for CPE, as scheduled.  ?   ? ?I, Lavon Paganini, MD, have reviewed all documentation for this visit. The documentation on 11/05/21 for the exam, diagnosis, procedures, and orders are all accurate and complete. ? ? ?Virginia Crews, MD, MPH ?[place]   ?

## 2021-11-05 ENCOUNTER — Encounter: Payer: Self-pay | Admitting: Family Medicine

## 2021-11-05 ENCOUNTER — Ambulatory Visit: Payer: 59 | Admitting: Family Medicine

## 2021-11-05 ENCOUNTER — Other Ambulatory Visit: Payer: Self-pay

## 2021-11-05 VITALS — BP 129/87 | HR 87 | Temp 98.0°F | Resp 16 | Wt 206.6 lb

## 2021-11-05 DIAGNOSIS — F419 Anxiety disorder, unspecified: Secondary | ICD-10-CM

## 2021-11-05 DIAGNOSIS — E66811 Obesity, class 1: Secondary | ICD-10-CM

## 2021-11-05 DIAGNOSIS — R7303 Prediabetes: Secondary | ICD-10-CM | POA: Diagnosis not present

## 2021-11-05 DIAGNOSIS — E669 Obesity, unspecified: Secondary | ICD-10-CM | POA: Diagnosis not present

## 2021-11-05 DIAGNOSIS — F331 Major depressive disorder, recurrent, moderate: Secondary | ICD-10-CM | POA: Diagnosis not present

## 2021-11-05 DIAGNOSIS — Z6832 Body mass index (BMI) 32.0-32.9, adult: Secondary | ICD-10-CM | POA: Diagnosis not present

## 2021-11-05 MED ORDER — FLUOXETINE HCL 20 MG PO CAPS
60.0000 mg | ORAL_CAPSULE | Freq: Every day | ORAL | 1 refills | Status: DC
  Filled 2021-11-05 – 2022-01-05 (×2): qty 270, 90d supply, fill #0
  Filled 2022-04-05: qty 270, 90d supply, fill #1

## 2021-11-05 MED ORDER — SEMAGLUTIDE (1 MG/DOSE) 4 MG/3ML ~~LOC~~ SOPN
1.0000 mg | PEN_INJECTOR | SUBCUTANEOUS | 3 refills | Status: DC
  Filled 2021-11-05: qty 3, 28d supply, fill #0
  Filled 2021-12-28: qty 3, 28d supply, fill #1

## 2021-11-05 NOTE — Assessment & Plan Note (Signed)
Congratulated on weight loss ?Increase ozempic to 1mg  daily ?Discussed importance of healthy weight management ?Discussed diet and exercise  ?

## 2021-11-05 NOTE — Assessment & Plan Note (Signed)
Chronic and well controlled ?Increase prozac as below for anxiety ?

## 2021-11-05 NOTE — Assessment & Plan Note (Signed)
A1c back to normal ?On ozempic for weight management ?

## 2021-11-05 NOTE — Assessment & Plan Note (Signed)
Chronic and improving, but not well controlled ?Increase prozac to 60mg  daily - at last visit we discussed that this gave her emotional blunting, but now she states that she never got to this dose, so we will try it ?Did not tolerate buspar ?Encourage therapy - discussed synergism with meds ?

## 2021-12-28 ENCOUNTER — Other Ambulatory Visit: Payer: Self-pay

## 2021-12-29 ENCOUNTER — Other Ambulatory Visit: Payer: Self-pay

## 2021-12-29 ENCOUNTER — Encounter: Payer: Self-pay | Admitting: Family Medicine

## 2021-12-30 ENCOUNTER — Encounter: Payer: Self-pay | Admitting: Oncology

## 2021-12-30 ENCOUNTER — Other Ambulatory Visit: Payer: Self-pay

## 2021-12-30 MED ORDER — SEMAGLUTIDE-WEIGHT MANAGEMENT 1 MG/0.5ML ~~LOC~~ SOAJ
1.0000 mg | SUBCUTANEOUS | 0 refills | Status: DC
  Filled 2021-12-30 – 2022-01-06 (×3): qty 2, 28d supply, fill #0
  Filled 2022-02-04: qty 2, 28d supply, fill #1
  Filled 2022-03-06: qty 2, 28d supply, fill #2

## 2022-01-05 ENCOUNTER — Other Ambulatory Visit: Payer: Self-pay | Admitting: Family Medicine

## 2022-01-05 ENCOUNTER — Other Ambulatory Visit: Payer: Self-pay

## 2022-01-05 DIAGNOSIS — K2100 Gastro-esophageal reflux disease with esophagitis, without bleeding: Secondary | ICD-10-CM

## 2022-01-05 MED ORDER — OMEPRAZOLE 20 MG PO CPDR
DELAYED_RELEASE_CAPSULE | Freq: Every day | ORAL | 0 refills | Status: DC
  Filled 2022-01-05: qty 90, 90d supply, fill #0

## 2022-01-06 ENCOUNTER — Encounter: Payer: Self-pay | Admitting: Oncology

## 2022-01-06 ENCOUNTER — Other Ambulatory Visit: Payer: Self-pay

## 2022-02-04 ENCOUNTER — Other Ambulatory Visit: Payer: Self-pay

## 2022-03-02 ENCOUNTER — Ambulatory Visit
Admission: RE | Admit: 2022-03-02 | Discharge: 2022-03-02 | Disposition: A | Discharge status: Home / Self Care | Payer: 59 | Source: Ambulatory Visit | Attending: Family Medicine | Admitting: Family Medicine

## 2022-03-02 DIAGNOSIS — Z1231 Encounter for screening mammogram for malignant neoplasm of breast: Secondary | ICD-10-CM | POA: Diagnosis not present

## 2022-03-02 DIAGNOSIS — Z0000 Encounter for general adult medical examination without abnormal findings: Secondary | ICD-10-CM

## 2022-03-07 ENCOUNTER — Other Ambulatory Visit: Payer: Self-pay

## 2022-03-30 ENCOUNTER — Ambulatory Visit (INDEPENDENT_AMBULATORY_CARE_PROVIDER_SITE_OTHER): Payer: 59 | Admitting: Family Medicine

## 2022-03-30 ENCOUNTER — Encounter: Payer: Self-pay | Admitting: Family Medicine

## 2022-03-30 ENCOUNTER — Other Ambulatory Visit: Payer: Self-pay

## 2022-03-30 VITALS — BP 118/83 | HR 80 | Ht 67.0 in | Wt 196.0 lb

## 2022-03-30 DIAGNOSIS — F419 Anxiety disorder, unspecified: Secondary | ICD-10-CM

## 2022-03-30 DIAGNOSIS — F17200 Nicotine dependence, unspecified, uncomplicated: Secondary | ICD-10-CM

## 2022-03-30 DIAGNOSIS — F331 Major depressive disorder, recurrent, moderate: Secondary | ICD-10-CM | POA: Diagnosis not present

## 2022-03-30 DIAGNOSIS — E669 Obesity, unspecified: Secondary | ICD-10-CM

## 2022-03-30 DIAGNOSIS — E7800 Pure hypercholesterolemia, unspecified: Secondary | ICD-10-CM

## 2022-03-30 DIAGNOSIS — Z122 Encounter for screening for malignant neoplasm of respiratory organs: Secondary | ICD-10-CM

## 2022-03-30 DIAGNOSIS — K5790 Diverticulosis of intestine, part unspecified, without perforation or abscess without bleeding: Secondary | ICD-10-CM | POA: Diagnosis not present

## 2022-03-30 DIAGNOSIS — Z0000 Encounter for general adult medical examination without abnormal findings: Secondary | ICD-10-CM

## 2022-03-30 DIAGNOSIS — Z780 Asymptomatic menopausal state: Secondary | ICD-10-CM | POA: Diagnosis not present

## 2022-03-30 DIAGNOSIS — Z6832 Body mass index (BMI) 32.0-32.9, adult: Secondary | ICD-10-CM

## 2022-03-30 DIAGNOSIS — R7303 Prediabetes: Secondary | ICD-10-CM | POA: Diagnosis not present

## 2022-03-30 MED ORDER — ROSUVASTATIN CALCIUM 10 MG PO TABS
10.0000 mg | ORAL_TABLET | Freq: Every day | ORAL | 3 refills | Status: DC
  Filled 2022-03-30: qty 90, 90d supply, fill #0
  Filled 2022-07-17: qty 90, 90d supply, fill #1
  Filled 2022-11-07: qty 90, 90d supply, fill #2
  Filled 2023-02-27: qty 90, 90d supply, fill #3

## 2022-03-30 NOTE — Progress Notes (Unsigned)
BP 118/83   Pulse 80   Ht '5\' 7"'$  (1.702 m)   Wt 196 lb (88.9 kg)   SpO2 96%   BMI 30.70 kg/m    Subjective:    Patient ID: Deborah Jenkins, female    DOB: 1955/09/25, 66 y.o.   MRN: [id number]  HPI: Deborah Jenkins is a 66 y.o. female presenting on 03/30/2022 for comprehensive medical examination. Current medical complaints include:{Blank single:19197::"none","***"}  Obesity - doing well on semaglutide. No formal exercise. Walking a lot at work. Eating 1.5 meals per day, fruits and vegetables, shakes for breakfast.   HLD - medications: crestor - compliance: good - medication SEs: none  Anxiety - Medications: prozac '60mg'$   - Taking: good compliance - Counseling: none - Symptoms: tired - Current stressors: *** - Coping Mechanisms: ***  Diverticulosis - no more bleeding.   She currently lives with: alone Menopausal Symptoms: no. Last LMP years ago.   Depression Screen done today and results listed below:     03/30/2022    3:17 PM 11/05/2021    9:39 AM 08/02/2021    8:25 AM 03/29/2021    9:20 AM 11/17/2020    8:42 AM  Depression screen PHQ 2/9  Decreased Interest 0 '2 3 2 2  '$ Down, Depressed, Hopeless 0 '2 2 2 '$ 0  PHQ - 2 Score 0 '4 5 4 2  '$ Altered sleeping 0 '2 2 2 2  '$ Tired, decreased energy '2 1 2 2 '$ 0  Change in appetite 0 0 2 0 2  Feeling bad or failure about yourself  0 0 '2 2 2  '$ Trouble concentrating 0 0 2 0 2  Moving slowly or fidgety/restless 0 0 0 0 0  Suicidal thoughts 0 0 0 0 0  PHQ-9 Score '2 7 15 10 10  '$ Difficult doing work/chores Not difficult at all Somewhat difficult Somewhat difficult Not difficult at all Not difficult at all    Past Medical History:  Past Medical History:  Diagnosis Date   Acute lower GI bleeding 11/13/2020   Depression 2006   GERD (gastroesophageal reflux disease) 2006   Iron deficiency anemia 01/04/2021    Surgical History:  Past Surgical History:  Procedure Laterality Date   COLONOSCOPY WITH PROPOFOL N/A 12/09/2020   Procedure:  COLONOSCOPY WITH PROPOFOL;  Surgeon: Virgel Manifold, MD;  Location: [facility];  Service: Endoscopy;  Laterality: N/A;   ESOPHAGOGASTRODUODENOSCOPY (EGD) WITH PROPOFOL N/A 12/09/2020   Procedure: ESOPHAGOGASTRODUODENOSCOPY (EGD) WITH PROPOFOL;  Surgeon: Virgel Manifold, MD;  Location: [facility] END[facility]COPY;  Service: Endoscopy;  Laterality: N/A;   FRACTURE SURGERY     TONSILLECTOMY Bilateral Childhood   TUBAL LIGATION     WRIST FRACTURE SURGERY Right    in childhood    Medications:  Current Outpatient Medications on File Prior to Visit  Medication Sig   albuterol (VENTOLIN HFA) 108 (90 Base) MCG/ACT inhaler Inhale 2 puffs into the lungs every 4 (four) hours as needed for wheezing or shortness of breath.   cholecalciferol (VITAMIN D3) 25 MCG (1000 UNIT) tablet Take 1,000 Units by mouth daily.   FLUoxetine (PROZAC) 20 MG capsule Take 3 capsules (60 mg total) by mouth daily.   fluticasone (FLONASE) 50 MCG/ACT nasal spray Place 1 spray into both nostrils daily as needed for allergies or rhinitis.   hydrocortisone cream 0.5 % Apply 1 application topically daily as needed for itching (on elbows).   ibuprofen (ADVIL) 600 MG tablet Take 1 tablet (600 mg total) by mouth every 6 (six)  hours as needed.   Melatonin 10 MG TABS Take 10 mg by mouth at bedtime.   omeprazole (PRILOSEC) 20 MG capsule TAKE 1 CAPSULE BY MOUTH ONCE A DAY   rosuvastatin (CRESTOR) 10 MG tablet Take 1 tablet (10 mg total) by mouth daily.   Semaglutide-Weight Management 1 MG/0.5ML SOAJ Inject 1 mg into the skin once a week.   No current facility-administered medications on file prior to visit.    Allergies:  Allergies  Allergen Reactions   Atorvastatin     Hair loss    Social History:  Social History   Socioeconomic History   Marital status: Single    Spouse name: Not on file   Number of children: 2   Years of education: 14   Highest education level: Associate degree: academic program  Occupational History    Occupation: Ship broker: [employer]    Comment: [employer]  Tobacco Use   Smoking status: Every Day    Packs/day: 0.30    Years: 20.00    Total pack years: 6.00    Types: Cigarettes   Smokeless tobacco: Never   Tobacco comments:    started smoking at 66 YO; is smoking 1/3 PPD  Vaping Use   Vaping Use: Never used  Substance and Sexual Activity   Alcohol use: No    Alcohol/week: 0.0 standard drinks of alcohol   Drug use: No   Sexual activity: Yes    Birth control/protection: Post-menopausal  Other Topics Concern   Not on file  Social History Narrative      Social Determinants of Health   Financial Resource Strain: Not on file  Food Insecurity: Not on file  Transportation Needs: Not on file  Physical Activity: Not on file  Stress: Not on file  Social Connections: Not on file  Intimate Partner Violence: Not on file   Social History   Tobacco Use  Smoking Status Every Day   Packs/day: 0.30   Years: 20.00   Total pack years: 6.00   Types: Cigarettes  Smokeless Tobacco Never  Tobacco Comments   started smoking at 66 YO; is smoking 1/3 PPD   Social History   Substance and Sexual Activity  Alcohol Use No   Alcohol/week: 0.0 standard drinks of alcohol    Family History:  Family History  Problem Relation Age of Onset   Hypertension Mother    Diverticulosis Mother    Healthy Father    Hypertension Sister    Healthy Sister    Healthy Brother    Healthy Sister    Alcohol abuse Brother    Alcohol abuse Brother    Cirrhosis Brother    Cirrhosis Brother    Colon cancer Neg Hx    Breast cancer Neg Hx    Ovarian cancer Neg Hx    Cervical cancer Neg Hx     Past medical history, surgical history, medications, allergies, family history and social history reviewed with patient today and changes made to appropriate areas of the chart.      Objective:    BP 118/83   Pulse 80   Ht '5\' 7"'$  (1.702 m)   Wt 196 lb (88.9 kg)   SpO2 96%   BMI 30.70 kg/m    Wt Readings from Last 3 Encounters:  03/30/22 196 lb (88.9 kg)  11/05/21 206 lb 9.6 oz (93.7 kg)  08/02/21 217 lb (98.4 kg)    Physical Exam  Results for orders placed or performed in visit on 09/22/21  Iron and TIBC([facility])  Result Value Ref Range   Iron 51 28 - 170 ug/dL   TIBC 337 250 - 450 ug/dL   Saturation Ratios 15 10.4 - 31.8 %   UIBC 286 ug/dL  Ferritin  Result Value Ref Range   Ferritin 24 11 - 307 ng/mL  Vitamin B12  Result Value Ref Range   Vitamin B-12 529 180 - 914 pg/mL  CBC with Differential/Platelet  Result Value Ref Range   WBC 6.5 4.0 - 10.5 K/uL   RBC 4.66 3.87 - 5.11 MIL/uL   Hemoglobin 13.2 12.0 - 15.0 g/dL   HCT 40.9 36.0 - 46.0 %   MCV 87.8 80.0 - 100.0 fL   MCH 28.3 26.0 - 34.0 pg   MCHC 32.3 30.0 - 36.0 g/dL   RDW 13.5 11.5 - 15.5 %   Platelets 317 150 - 400 K/uL   nRBC 0.0 0.0 - 0.2 %   Neutrophils Relative % 54 %   Neutro Abs 3.4 1.7 - 7.7 K/uL   Lymphocytes Relative 38 %   Lymphs Abs 2.5 0.7 - 4.0 K/uL   Monocytes Relative 6 %   Monocytes Absolute 0.4 0.1 - 1.0 K/uL   Eosinophils Relative 1 %   Eosinophils Absolute 0.1 0.0 - 0.5 K/uL   Basophils Relative 1 %   Basophils Absolute 0.0 0.0 - 0.1 K/uL   Immature Granulocytes 0 %   Abs Immature Granulocytes 0.01 0.00 - 0.07 K/uL  Comprehensive metabolic panel  Result Value Ref Range   Sodium 137 135 - 145 mmol/L   Potassium 3.8 3.5 - 5.1 mmol/L   Chloride 106 98 - 111 mmol/L   CO2 20 (L) 22 - 32 mmol/L   Glucose, Bld 112 (H) 70 - 99 mg/dL   BUN 12 8 - 23 mg/dL   Creatinine, Ser 0.99 0.44 - 1.00 mg/dL   Calcium 8.8 (L) 8.9 - 10.3 mg/dL   Total Protein 7.2 6.5 - 8.1 g/dL   Albumin 4.1 3.5 - 5.0 g/dL   AST 25 15 - 41 U/L   ALT 27 0 - 44 U/L   Alkaline Phosphatase 65 38 - 126 U/L   Total Bilirubin 0.5 0.3 - 1.2 mg/dL   GFR, Estimated >60 >60 mL/min   Anion gap 11 5 - 15      Assessment & Plan:   Problem List Items Addressed This Visit   None Visit Diagnoses      Annual physical exam    -  Primary   Relevant Orders   Hepatitis C antibody   Comprehensive metabolic panel   CBC with Differential   Lipid panel   Hemoglobin A1c        Follow up plan: No follow-ups on file.   LABORATORY TESTING:  - Pap smear: up to date  IMMUNIZATIONS:   - Tdap: Tetanus vaccination status reviewed: last tetanus booster within 10 years. - Influenza: Postponed to flu season - Pneumovax: {Blank single:19197::"Up to date","Administered today","Not applicable","Refused","Given elsewhere"} - Prevnar: {Blank single:19197::"Up to date","Administered today","Not applicable","Refused","Given elsewhere"} - HPV: Not applicable - Shingrix vaccine: {Blank single:19197::"Up to date","Administered today","Not applicable","Refused","Given elsewhere"} - COVID vaccine: has received 2 doses of mRNA vaccine  SCREENING: -Mammogram: Up to date  - Colonoscopy: Up to date  - Bone Density: Ordered today  - Lung Cancer Screening: {Blank single:19197::"Up to date","Ordered today","Not applicable","Refused","Done elsewhere"}   Hep C Screening: *** STD testing and prevention (HIV/chl/gon/syphilis): no concerns Sexual History : not currently Menstrual History/LMP/Abnormal Bleeding: post-menopausal Incontinence Symptoms:  none  Osteoporosis: Discussed high calcium and vitamin D supplementation, weight bearing exercises  Advanced Care Planning: A voluntary discussion about advance care planning including the explanation and discussion of advance directives.  Discussed health care proxy and Living will, and the patient was able to identify a health care proxy as sister, Deborah Jenkins.  Patient does not have a living will at present time. If patient does have living will, I have requested they bring this to the clinic to be scanned in to their chart.  PATIENT COUNSELING:   Advised to take 1 mg of folate supplement per day if capable of pregnancy.   Sexuality: Discussed sexually transmitted  diseases, partner selection, use of condoms, avoidance of unintended pregnancy  and contraceptive alternatives.   Advised to avoid cigarette smoking.  I discussed with the patient that most people either abstain from alcohol or drink within safe limits (<=14/week and <=4 drinks/occasion for males, <=7/weeks and <= 3 drinks/occasion for females) and that the risk for alcohol disorders and other health effects rises proportionally with the number of drinks per week and how often a drinker exceeds daily limits.  Discussed cessation/primary prevention of drug use and availability of treatment for abuse.   Diet: Encouraged to adjust caloric intake to maintain  or achieve ideal body weight, to reduce intake of dietary saturated fat and total fat, to limit sodium intake by avoiding high sodium foods and not adding table salt, and to maintain adequate dietary potassium and calcium preferably from fresh fruits, vegetables, and low-fat dairy products.    Stressed the importance of regular exercise  Injury prevention: Discussed safety belts, safety helmets, smoke detector, smoking near bedding or upholstery.   Dental health: Discussed importance of regular tooth brushing, flossing, and dental visits.    NEXT PREVENTATIVE PHYSICAL DUE IN 1 YEAR. No follow-ups on file.

## 2022-03-30 NOTE — Patient Instructions (Signed)
It was great to see you!  Our plans for today:  - Someone will call you about appointments for your bone density scan and CT scans. - We are checking some labs today, we will release these results to your MyChart.  Take care and seek immediate care sooner if you develop any concerns.   Dr. Ky Barban  Things to do to keep yourself healthy  - Exercise at least 30-45 minutes a day, 3-4 days a week.  - Eat a low-fat diet with lots of fruits and vegetables, up to 7-9 servings per day.  - Seatbelts can save your life. Wear them always.  - Smoke detectors on every level of your home, check batteries every year.  - Eye Doctor - have an eye exam every 1-2 years  - Safe sex - if you may be exposed to STDs, use a condom.  - Alcohol -  If you drink, do it moderately, less than 2 drinks per day.  - Terry. Choose someone to speak for you if you are not able. https://www.prepareforyourcare.org is a great website to help you navigate this. - Depression is common in our stressful world.If you're feeling down or losing interest in things you normally enjoy, please come in for a visit.  - Violence - If anyone is threatening or hurting you, please call immediately.

## 2022-03-31 ENCOUNTER — Encounter: Payer: 59 | Admitting: Family Medicine

## 2022-03-31 LAB — COMPREHENSIVE METABOLIC PANEL
ALT: 32 IU/L (ref 0–32)
AST: 24 IU/L (ref 0–40)
Albumin/Globulin Ratio: 1.8 (ref 1.2–2.2)
Albumin: 4.5 g/dL (ref 3.9–4.9)
Alkaline Phosphatase: 71 IU/L (ref 44–121)
BUN/Creatinine Ratio: 11 — ABNORMAL LOW (ref 12–28)
BUN: 9 mg/dL (ref 8–27)
Bilirubin Total: 0.3 mg/dL (ref 0.0–1.2)
CO2: 20 mmol/L (ref 20–29)
Calcium: 9.5 mg/dL (ref 8.7–10.3)
Chloride: 103 mmol/L (ref 96–106)
Creatinine, Ser: 0.83 mg/dL (ref 0.57–1.00)
Globulin, Total: 2.5 g/dL (ref 1.5–4.5)
Glucose: 82 mg/dL (ref 70–99)
Potassium: 4.1 mmol/L (ref 3.5–5.2)
Sodium: 138 mmol/L (ref 134–144)
Total Protein: 7 g/dL (ref 6.0–8.5)
eGFR: 79 mL/min/{1.73_m2} (ref >59)

## 2022-03-31 LAB — LIPID PANEL
Chol/HDL Ratio: 2 ratio (ref 0.0–4.4)
Cholesterol, Total: 164 mg/dL (ref 100–199)
HDL: 81 mg/dL (ref >39)
LDL Chol Calc (NIH): 69 mg/dL (ref 0–99)
Triglycerides: 72 mg/dL (ref 0–149)
VLDL Cholesterol Cal: 14 mg/dL (ref 5–40)

## 2022-03-31 LAB — CBC WITH DIFFERENTIAL/PLATELET
Basophils Absolute: 0 10*3/uL (ref 0.0–0.2)
Basos: 0 %
EOS (ABSOLUTE): 0.1 10*3/uL (ref 0.0–0.4)
Eos: 1 %
Hematocrit: 41.9 % (ref 34.0–46.6)
Hemoglobin: 13.6 g/dL (ref 11.1–15.9)
Immature Grans (Abs): 0 10*3/uL (ref 0.0–0.1)
Immature Granulocytes: 0 %
Lymphocytes Absolute: 2.4 10*3/uL (ref 0.7–3.1)
Lymphs: 29 %
MCH: 28.3 pg (ref 26.6–33.0)
MCHC: 32.5 g/dL (ref 31.5–35.7)
MCV: 87 fL (ref 79–97)
Monocytes Absolute: 0.7 10*3/uL (ref 0.1–0.9)
Monocytes: 9 %
Neutrophils Absolute: 5.2 10*3/uL (ref 1.4–7.0)
Neutrophils: 61 %
Platelets: 344 10*3/uL (ref 150–450)
RBC: 4.8 x10E6/uL (ref 3.77–5.28)
RDW: 13.2 % (ref 11.7–15.4)
WBC: 8.5 10*3/uL (ref 3.4–10.8)

## 2022-03-31 LAB — HEMOGLOBIN A1C
Est. average glucose Bld gHb Est-mCnc: 117 mg/dL
Hgb A1c MFr Bld: 5.7 % — ABNORMAL HIGH (ref 4.8–5.6)

## 2022-03-31 LAB — HEPATITIS C ANTIBODY: Hep C Virus Ab: NONREACTIVE

## 2022-03-31 NOTE — Assessment & Plan Note (Signed)
Recheck a1c. Continue weight loss efforts.

## 2022-03-31 NOTE — Assessment & Plan Note (Signed)
Doing well on current regimen, no changes made today. 

## 2022-03-31 NOTE — Assessment & Plan Note (Signed)
Contributing to HLD, prediabetes. Has lost weight, tolerant of semaglutide, continue. Recheck labs. Continue to work on weight loss efforts, diet, exercise.

## 2022-03-31 NOTE — Assessment & Plan Note (Signed)
Tolerant of statin, continue. Recheck labs and adjust as indicated.

## 2022-03-31 NOTE — Assessment & Plan Note (Signed)
Advised cessation. With 0.5ppd for at least 40 years and >64yo, qualifies for lung cancer screening, ordered today. Consider PCV20 at followup.

## 2022-04-03 ENCOUNTER — Other Ambulatory Visit: Payer: Self-pay | Admitting: Family Medicine

## 2022-04-03 DIAGNOSIS — K2100 Gastro-esophageal reflux disease with esophagitis, without bleeding: Secondary | ICD-10-CM

## 2022-04-04 ENCOUNTER — Other Ambulatory Visit: Payer: Self-pay

## 2022-04-05 ENCOUNTER — Other Ambulatory Visit: Payer: Self-pay

## 2022-04-05 MED ORDER — WEGOVY 1 MG/0.5ML ~~LOC~~ SOAJ
1.0000 mg | SUBCUTANEOUS | 0 refills | Status: DC
  Filled 2022-04-05: qty 2, 28d supply, fill #0
  Filled 2022-05-08: qty 2, 28d supply, fill #1
  Filled 2022-06-10: qty 2, 28d supply, fill #2

## 2022-04-05 MED ORDER — OMEPRAZOLE 20 MG PO CPDR
DELAYED_RELEASE_CAPSULE | Freq: Every day | ORAL | 0 refills | Status: DC
  Filled 2022-04-05: qty 90, 90d supply, fill #0

## 2022-04-05 NOTE — Telephone Encounter (Signed)
Requested Prescriptions  Pending Prescriptions Disp Refills  . Semaglutide-Weight Management (WEGOVY) 1 MG/0.5ML SOAJ 6 mL 0    Sig: Inject 1 mg into the skin once a week.     Endocrinology:  Diabetes - GLP-1 Receptor Agonists - semaglutide Failed - 04/03/2022  6:50 AM      Failed - HBA1C in normal range and within 180 days    Hgb A1c MFr Bld  Date Value Ref Range Status  03/30/2022 5.7 (H) 4.8 - 5.6 % Final    Comment:             Prediabetes: 5.7 - 6.4          Diabetes: >6.4          Glycemic control for adults with diabetes: <7.0          Passed - Cr in normal range and within 360 days    Creatinine, Ser  Date Value Ref Range Status  03/30/2022 0.83 0.57 - 1.00 mg/dL Final         Passed - Valid encounter within last 6 months    Recent Outpatient Visits          6 days ago Annual physical exam   [facility] Myles Gip, DO   5 months ago [place], Dionne Bucy, MD   8 months ago [place], Dionne Bucy, MD   1 year ago Visit for annual health examination   [facility] [place], Dionne Bucy, MD   1 year ago Acute lower GI bleeding   [place], [place], [place]

## 2022-04-05 NOTE — Telephone Encounter (Signed)
Requested Prescriptions  Pending Prescriptions Disp Refills  . omeprazole (PRILOSEC) 20 MG capsule 90 capsule 0    Sig: TAKE 1 CAPSULE BY MOUTH ONCE A DAY     Gastroenterology: Proton Pump Inhibitors Passed - 04/03/2022  6:50 AM      Passed - Valid encounter within last 12 months    Recent Outpatient Visits          6 days ago Annual physical exam   [facility] Myles Gip, DO   5 months ago [place], Dionne Bucy, MD   8 months ago [place], Dionne Bucy, MD   1 year ago Visit for annual health examination   [facility] [place], Dionne Bucy, MD   1 year ago Acute lower GI bleeding   [place], [place], [place]

## 2022-05-09 ENCOUNTER — Other Ambulatory Visit: Payer: Self-pay

## 2022-05-10 ENCOUNTER — Other Ambulatory Visit: Payer: Self-pay

## 2022-05-11 ENCOUNTER — Ambulatory Visit
Admission: RE | Admit: 2022-05-11 | Discharge: 2022-05-11 | Disposition: A | Discharge status: Home / Self Care | Payer: 59 | Source: Ambulatory Visit | Attending: Family Medicine | Admitting: Family Medicine

## 2022-05-11 DIAGNOSIS — Z780 Asymptomatic menopausal state: Secondary | ICD-10-CM | POA: Diagnosis not present

## 2022-05-11 DIAGNOSIS — M85852 Other specified disorders of bone density and structure, left thigh: Secondary | ICD-10-CM | POA: Diagnosis not present

## 2022-05-11 DIAGNOSIS — Z0000 Encounter for general adult medical examination without abnormal findings: Secondary | ICD-10-CM | POA: Insufficient documentation

## 2022-06-10 ENCOUNTER — Other Ambulatory Visit: Payer: Self-pay

## 2022-07-04 ENCOUNTER — Other Ambulatory Visit: Payer: Self-pay | Admitting: Family Medicine

## 2022-07-04 ENCOUNTER — Other Ambulatory Visit: Payer: Self-pay

## 2022-07-04 DIAGNOSIS — K2100 Gastro-esophageal reflux disease with esophagitis, without bleeding: Secondary | ICD-10-CM

## 2022-07-04 DIAGNOSIS — F419 Anxiety disorder, unspecified: Secondary | ICD-10-CM

## 2022-07-04 MED ORDER — FLUOXETINE HCL 20 MG PO CAPS
60.0000 mg | ORAL_CAPSULE | Freq: Every day | ORAL | 1 refills | Status: DC
  Filled 2022-07-04: qty 270, 90d supply, fill #0
  Filled 2022-10-05: qty 270, 90d supply, fill #1

## 2022-07-04 MED ORDER — OMEPRAZOLE 20 MG PO CPDR
DELAYED_RELEASE_CAPSULE | Freq: Every day | ORAL | 0 refills | Status: DC
  Filled 2022-07-04: qty 90, 90d supply, fill #0

## 2022-07-17 ENCOUNTER — Other Ambulatory Visit: Payer: Self-pay | Admitting: Family Medicine

## 2022-07-18 ENCOUNTER — Other Ambulatory Visit: Payer: Self-pay

## 2022-07-18 MED ORDER — WEGOVY 1 MG/0.5ML ~~LOC~~ SOAJ
1.0000 mg | SUBCUTANEOUS | 0 refills | Status: DC
  Filled 2022-07-18: qty 2, 28d supply, fill #0
  Filled 2022-08-13 – 2022-08-23 (×4): qty 2, 28d supply, fill #1
  Filled 2022-09-20: qty 2, 28d supply, fill #2

## 2022-08-15 ENCOUNTER — Other Ambulatory Visit: Payer: Self-pay

## 2022-08-16 ENCOUNTER — Other Ambulatory Visit: Payer: Self-pay

## 2022-08-18 ENCOUNTER — Encounter: Payer: Self-pay | Admitting: Oncology

## 2022-08-20 ENCOUNTER — Encounter: Payer: Self-pay | Admitting: Oncology

## 2022-08-21 ENCOUNTER — Other Ambulatory Visit: Payer: Self-pay

## 2022-08-22 ENCOUNTER — Telehealth: Payer: Self-pay

## 2022-08-22 NOTE — Telephone Encounter (Signed)
Copied from [place] [id number]. Topic: General - Other >> Aug 19, 2022 12:25 PM Ja-Kwan M wrote: Reason for CRM: Pt stated she was returning the call to provide her weight (192 lbs) so a refill of the Semaglutide-Weight Management (WEGOVY) 1 MG/0.5ML SOAJ could be approved.

## 2022-08-23 ENCOUNTER — Other Ambulatory Visit: Payer: Self-pay

## 2022-09-21 ENCOUNTER — Inpatient Hospital Stay: Payer: Commercial Managed Care - PPO

## 2022-09-22 ENCOUNTER — Inpatient Hospital Stay: Payer: Commercial Managed Care - PPO

## 2022-09-22 ENCOUNTER — Inpatient Hospital Stay: Payer: Commercial Managed Care - PPO | Admitting: Oncology

## 2022-10-05 ENCOUNTER — Other Ambulatory Visit: Payer: Self-pay

## 2022-10-05 ENCOUNTER — Other Ambulatory Visit: Payer: Self-pay | Admitting: Family Medicine

## 2022-10-05 DIAGNOSIS — K2100 Gastro-esophageal reflux disease with esophagitis, without bleeding: Secondary | ICD-10-CM

## 2022-10-05 MED ORDER — OMEPRAZOLE 20 MG PO CPDR
20.0000 mg | DELAYED_RELEASE_CAPSULE | Freq: Every day | ORAL | 0 refills | Status: DC
  Filled 2022-10-05: qty 90, 90d supply, fill #0

## 2022-10-22 ENCOUNTER — Other Ambulatory Visit: Payer: Self-pay | Admitting: Family Medicine

## 2022-10-24 ENCOUNTER — Other Ambulatory Visit: Payer: Self-pay

## 2022-10-24 MED ORDER — WEGOVY 1 MG/0.5ML ~~LOC~~ SOAJ
1.0000 mg | SUBCUTANEOUS | 0 refills | Status: DC
  Filled 2022-10-24: qty 2, 28d supply, fill #0
  Filled 2022-12-03: qty 2, 28d supply, fill #1

## 2022-11-22 ENCOUNTER — Inpatient Hospital Stay: Payer: Commercial Managed Care - PPO | Attending: Oncology

## 2022-11-22 ENCOUNTER — Other Ambulatory Visit: Payer: Self-pay

## 2022-11-22 DIAGNOSIS — D509 Iron deficiency anemia, unspecified: Secondary | ICD-10-CM

## 2022-11-22 LAB — CBC WITH DIFFERENTIAL (CANCER CENTER ONLY)
Abs Immature Granulocytes: 0.02 10*3/uL (ref 0.00–0.07)
Basophils Absolute: 0 10*3/uL (ref 0.0–0.1)
Basophils Relative: 1 %
Eosinophils Absolute: 0.1 10*3/uL (ref 0.0–0.5)
Eosinophils Relative: 1 %
HCT: 43.4 % (ref 36.0–46.0)
Hemoglobin: 13.7 g/dL (ref 12.0–15.0)
Immature Granulocytes: 0 %
Lymphocytes Relative: 25 %
Lymphs Abs: 1.4 10*3/uL (ref 0.7–4.0)
MCH: 29 pg (ref 26.0–34.0)
MCHC: 31.6 g/dL (ref 30.0–36.0)
MCV: 91.9 fL (ref 80.0–100.0)
Monocytes Absolute: 0.4 10*3/uL (ref 0.1–1.0)
Monocytes Relative: 7 %
Neutro Abs: 3.7 10*3/uL (ref 1.7–7.7)
Neutrophils Relative %: 66 %
Platelet Count: 358 10*3/uL (ref 150–400)
RBC: 4.72 MIL/uL (ref 3.87–5.11)
RDW: 13.1 % (ref 11.5–15.5)
WBC Count: 5.6 10*3/uL (ref 4.0–10.5)
nRBC: 0 % (ref 0.0–0.2)

## 2022-11-22 LAB — IRON AND TIBC
Iron: 55 ug/dL (ref 28–170)
Saturation Ratios: 15 % (ref 10.4–31.8)
TIBC: 371 ug/dL (ref 250–450)
UIBC: 316 ug/dL

## 2022-11-22 LAB — FERRITIN: Ferritin: 18 ng/mL (ref 11–307)

## 2022-11-22 MED FILL — Iron Sucrose Inj 20 MG/ML (Fe Equiv): INTRAVENOUS | Qty: 10 | Status: AC

## 2022-11-23 ENCOUNTER — Inpatient Hospital Stay (HOSPITAL_BASED_OUTPATIENT_CLINIC_OR_DEPARTMENT_OTHER): Payer: Commercial Managed Care - PPO | Admitting: Oncology

## 2022-11-23 ENCOUNTER — Encounter: Payer: Self-pay | Admitting: Oncology

## 2022-11-23 ENCOUNTER — Inpatient Hospital Stay: Payer: Commercial Managed Care - PPO

## 2022-11-23 VITALS — BP 117/80 | HR 85 | Temp 97.4°F | Resp 18 | Wt 194.6 lb

## 2022-11-23 DIAGNOSIS — D508 Other iron deficiency anemias: Secondary | ICD-10-CM | POA: Diagnosis not present

## 2022-11-23 DIAGNOSIS — D509 Iron deficiency anemia, unspecified: Secondary | ICD-10-CM | POA: Diagnosis not present

## 2022-11-23 NOTE — Progress Notes (Signed)
Pt here for follow up. No new concerns voiced.   

## 2022-11-23 NOTE — Assessment & Plan Note (Addendum)
Labs reviewed and discussed with patient. Patient has normal iron panel and hemoglobin level. Hold off IV Venofer treatments. Recommend patient to continue Vitron C daily.  OTC supply.  She may also take vitamin C 500 mg daily supplementation to help with absorption.

## 2022-11-23 NOTE — Progress Notes (Signed)
Hematology/Oncology Progress note [phone] Fax:([phone]) [phone]      CHIEF COMPLAINTS/REASON FOR VISIT:  iron deficiency anemia  ASSESSMENT & PLAN:   Iron deficiency anemia Labs reviewed and discussed with patient. Patient has normal iron panel and hemoglobin level. Hold off IV Venofer treatments. Recommend patient to continue Vitron C daily.  OTC supply.  She may also take vitamin C 500 mg daily supplementation to help with absorption.  Patient is discharged from my clinic. I recommend patient to continue follow up with primary care physician. Patient may re-establish care in the future if clinically indicated.  All questions were answered. The patient knows to call the clinic with any problems, questions or concerns.  Earlie Server, MD, PhD [facility] Hematology Oncology 11/23/2022   HISTORY OF PRESENTING ILLNESS:  Deborah Jenkins is a  67 y.o.  female with PMH listed below was seen in consultation at the request of Virginia Crews, MD  for evaluation of iron deficiency anemia.   Reviewed patient's recent labs  12/30/2020 Labs revealed anemia with hemoglobin of 9.7, mcv 81  .   Reviewed patient's previous labs ordered by primary care physician's office, anemia is new since March 2022.  March 2022 she was admitted initially to [facility] for lower GI bleeding due to diverticulosis.  Patient was transfused with PRBC during admission and a nuclear medicine scan was performed.  A bleeding spot at the transverse colon and the splenic flexure was localized.  Patient was transferred to [facility].  Patient was recommended to proceed with conversive management and active bleeding resolved.  Patient was was discharged home.  And follow-up with gastroenterologist Dr.Tahiliani 12/09/2020 upper EGD showed gastric mucosal atrophy, single mucosal papule found in the stomach, biopsied small hiatal hernia, normal examined duodenum.  Biopsies were obtained in the gastric body, incisura  and gastric antrum-pathology negative for malignancy or dysplasia.  Colonoscopy showed 4 mm polyp in the cecum.  Removed in retrieved-pathology showed benign colonic mucosa with reactive lymphoid tissue..  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  Biopsied.  Pathology showed benign colonic mucosa with submucosal adipose tissue consistent with lipoma.  Diverticulosis in the sigmoid colon.  Otherwise normal.  Nonbleeding internal hemorrhoids.  Patient is postmenopausal.  She denies any additional episodes of rectal bleeding. Reports feeling very tired.  She craves for ice chips and cornstarch.  Shortness of breath with exertion Also reports hair loss.  INTERVAL HISTORY Deborah Jenkins is a 67 y.o. female who has above history reviewed by me today presents for follow up visit for management of IDA Problems and complaints are listed below: Patient reports feeling well.  She takes iron supplementation daily.  Tolerates well.  Some chronic fatigue, stable.   Review of Systems  Constitutional:  Positive for fatigue. Negative for appetite change, chills and fever.  HENT:   Negative for hearing loss and voice change.   Eyes:  Negative for eye problems.  Respiratory:  Negative for chest tightness and cough.   Cardiovascular:  Negative for chest pain.  Gastrointestinal:  Negative for abdominal distention, abdominal pain and blood in stool.  Endocrine: Negative for hot flashes.  Genitourinary:  Negative for difficulty urinating and frequency.   Musculoskeletal:  Negative for arthralgias.  Skin:  Negative for itching and rash.       Hair loss  Neurological:  Negative for extremity weakness.  Hematological:  Negative for adenopathy.  Psychiatric/Behavioral:  Negative for confusion.     MEDICAL HISTORY:  Past Medical History:  Diagnosis Date   Acute lower GI bleeding 11/13/2020   Depression 2006   GERD (gastroesophageal reflux disease) 2006   Iron deficiency anemia 01/04/2021    SURGICAL  HISTORY: Past Surgical History:  Procedure Laterality Date   COLONOSCOPY WITH PROPOFOL N/A 12/09/2020   Procedure: COLONOSCOPY WITH PROPOFOL;  Surgeon: Virgel Manifold, MD;  Location: [facility] ENDOSCOPY;  Service: Endoscopy;  Laterality: N/A;   ESOPHAGOGASTRODUODENOSCOPY (EGD) WITH PROPOFOL N/A 12/09/2020   Procedure: ESOPHAGOGASTRODUODENOSCOPY (EGD) WITH PROPOFOL;  Surgeon: Virgel Manifold, MD;  Location: [facility] ENDOSCOPY;  Service: Endoscopy;  Laterality: N/A;   FRACTURE SURGERY     TONSILLECTOMY Bilateral Childhood   TUBAL LIGATION     WRIST FRACTURE SURGERY Right    in childhood    SOCIAL HISTORY: Social History   Socioeconomic History   Marital status: Single    Spouse name: Not on file   Number of children: 2   Years of education: 14   Highest education level: Associate degree: academic program  Occupational History   Occupation: Ship broker: [employer]    Comment: [employer]  Tobacco Use   Smoking status: Every Day    Packs/day: 0.30    Years: 20.00    Additional pack years: 0.00    Total pack years: 6.00    Types: Cigarettes   Smokeless tobacco: Never   Tobacco comments:    started smoking at 67 YO; is smoking 1/3 PPD  Vaping Use   Vaping Use: Never used  Substance and Sexual Activity   Alcohol use: No    Alcohol/week: 0.0 standard drinks of alcohol   Drug use: No   Sexual activity: Yes    Birth control/protection: Post-menopausal  Other Topics Concern   Not on file  Social History Narrative      Social Determinants of Health   Financial Resource Strain: Not on file  Food Insecurity: Not on file  Transportation Needs: Not on file  Physical Activity: Not on file  Stress: Not on file  Social Connections: Not on file  Intimate Partner Violence: Not on file    FAMILY HISTORY: Family History  Problem Relation Age of Onset   Hypertension Mother    Diverticulosis Mother    Healthy Father    Hypertension Sister    Healthy Sister     Healthy Brother    Healthy Sister    Alcohol abuse Brother    Alcohol abuse Brother    Cirrhosis Brother    Cirrhosis Brother    Colon cancer Neg Hx    Breast cancer Neg Hx    Ovarian cancer Neg Hx    Cervical cancer Neg Hx     ALLERGIES:  is allergic to atorvastatin.  MEDICATIONS:  Current Outpatient Medications  Medication Sig Dispense Refill   albuterol (VENTOLIN HFA) 108 (90 Base) MCG/ACT inhaler Inhale 2 puffs into the lungs every 4 (four) hours as needed for wheezing or shortness of breath. 18 g 0   cholecalciferol (VITAMIN D3) 25 MCG (1000 UNIT) tablet Take 1,000 Units by mouth daily.     ferrous sulfate 325 (65 FE) MG EC tablet Take 325 mg by mouth daily with breakfast.     FLUoxetine (PROZAC) 20 MG capsule Take 3 capsules (60 mg total) by mouth daily. 270 capsule 1   fluticasone (FLONASE) 50 MCG/ACT nasal spray Place 1 spray into both nostrils daily as needed for allergies or rhinitis. 16 g 11   hydrocortisone cream 0.5 % Apply 1 application topically  daily as needed for itching (on elbows).     Melatonin 10 MG TABS Take 10 mg by mouth at bedtime.     omeprazole (PRILOSEC) 20 MG capsule Take 1 capsule (20 mg total) by mouth daily. 90 capsule 0   rosuvastatin (CRESTOR) 10 MG tablet Take 1 tablet (10 mg total) by mouth daily. 90 tablet 3   Semaglutide-Weight Management (WEGOVY) 1 MG/0.5ML SOAJ Inject 1 mg into the skin once a week. Please schedule office visit before any future refill. 6 mL 0   No current facility-administered medications for this visit.     PHYSICAL EXAMINATION: ECOG PERFORMANCE STATUS: 1 - Symptomatic but completely ambulatory Vitals:   11/23/22 1324  BP: 117/80  Pulse: 85  Resp: 18  Temp: (!) 97.4 F (36.3 C)   Filed Weights   11/23/22 1324  Weight: 194 lb 9.6 oz (88.3 kg)    Physical Exam Constitutional:      General: She is not in acute distress. HENT:     Head: Normocephalic and atraumatic.  Eyes:     General: No scleral  icterus. Cardiovascular:     Rate and Rhythm: Normal rate and regular rhythm.     Heart sounds: Normal heart sounds.  Pulmonary:     Effort: Pulmonary effort is normal. No respiratory distress.     Breath sounds: No wheezing.  Abdominal:     General: Bowel sounds are normal. There is no distension.     Palpations: Abdomen is soft.  Musculoskeletal:        General: No deformity. Normal range of motion.     Cervical back: Normal range of motion and neck supple.  Skin:    General: Skin is warm and dry.     Findings: No erythema or rash.  Neurological:     Mental Status: She is alert and oriented to person, place, and time. Mental status is at baseline.     Cranial Nerves: No cranial nerve deficit.     Coordination: Coordination normal.  Psychiatric:        Mood and Affect: Mood normal.          Latest Ref Rng & Units 03/30/2022    3:53 PM  CMP  Glucose 70 - 99 mg/dL 82   BUN 8 - 27 mg/dL 9   Creatinine 0.57 - 1.00 mg/dL 0.83   Sodium 134 - 144 mmol/L 138   Potassium 3.5 - 5.2 mmol/L 4.1   Chloride 96 - 106 mmol/L 103   CO2 20 - 29 mmol/L 20   Calcium 8.7 - 10.3 mg/dL 9.5   Total Protein 6.0 - 8.5 g/dL 7.0   Total Bilirubin 0.0 - 1.2 mg/dL 0.3   Alkaline Phos 44 - 121 IU/L 71   AST 0 - 40 IU/L 24   ALT 0 - 32 IU/L 32       Latest Ref Rng & Units 11/22/2022    8:47 AM  CBC  WBC 4.0 - 10.5 K/uL 5.6   Hemoglobin 12.0 - 15.0 g/dL 13.7   Hematocrit 36.0 - 46.0 % 43.4   Platelets 150 - 400 K/uL 358      LABORATORY DATA:  I have reviewed the data as listed    Latest Ref Rng & Units 11/22/2022    8:47 AM 03/30/2022    3:53 PM 09/22/2021    2:58 PM  CBC  WBC 4.0 - 10.5 K/uL 5.6  8.5  6.5   Hemoglobin 12.0 - 15.0 g/dL 13.7  13.6  13.2   Hematocrit 36.0 - 46.0 % 43.4  41.9  40.9   Platelets 150 - 400 K/uL 358  344  317      Iron/TIBC/Ferritin/ %Sat    Component Value Date/Time   IRON 55 11/22/2022 0847   IRON 33 12/30/2020 1623   TIBC 371 11/22/2022 0847    TIBC 391 12/30/2020 1623   FERRITIN 18 11/22/2022 0847   FERRITIN 15 12/30/2020 1623   IRONPCTSAT 15 11/22/2022 0847   IRONPCTSAT 8 (LL) 12/30/2020 1623     RADIOGRAPHIC STUDIES: I have personally reviewed the radiological images as listed and agreed with the findings in the report. No results found.

## 2022-12-06 ENCOUNTER — Encounter: Payer: Self-pay | Admitting: Oncology

## 2022-12-06 ENCOUNTER — Other Ambulatory Visit: Payer: Self-pay

## 2023-01-04 ENCOUNTER — Other Ambulatory Visit: Payer: Self-pay | Admitting: Family Medicine

## 2023-01-04 DIAGNOSIS — K2100 Gastro-esophageal reflux disease with esophagitis, without bleeding: Secondary | ICD-10-CM

## 2023-01-04 DIAGNOSIS — F419 Anxiety disorder, unspecified: Secondary | ICD-10-CM

## 2023-01-05 ENCOUNTER — Other Ambulatory Visit: Payer: Self-pay

## 2023-01-05 ENCOUNTER — Encounter: Payer: Self-pay | Admitting: Oncology

## 2023-01-05 MED ORDER — FLUOXETINE HCL 20 MG PO CAPS
60.0000 mg | ORAL_CAPSULE | Freq: Every day | ORAL | 0 refills | Status: DC
Start: 2023-01-05 — End: 2023-04-03
  Filled 2023-01-05: qty 270, 90d supply, fill #0

## 2023-01-05 MED ORDER — OMEPRAZOLE 20 MG PO CPDR
20.0000 mg | DELAYED_RELEASE_CAPSULE | Freq: Every day | ORAL | 0 refills | Status: DC
Start: 2023-01-05 — End: 2023-04-03
  Filled 2023-01-05: qty 90, 90d supply, fill #0

## 2023-01-05 NOTE — Telephone Encounter (Signed)
Requested Prescriptions  Pending Prescriptions Disp Refills   FLUoxetine (PROZAC) 20 MG capsule 270 capsule 0    Sig: Take 3 capsules (60 mg total) by mouth daily.     Psychiatry:  Antidepressants - SSRI Failed - 01/04/2023  4:25 PM      Failed - Valid encounter within last 6 months    Recent Outpatient Visits           9 months ago Annual physical exam   [facility] Caro Laroche, DO   1 year ago Anxiety   [place] [facility] [place], Marzella Schlein, MD   1 year ago Anxiety   [place] [facility] [place], Marzella Schlein, MD   1 year ago Visit for annual health examination   [place] [facility] Erasmo Downer, MD   2 years ago Acute lower GI bleeding   [facility] [place] [facility] Osvaldo Angst M, [place]              Passed - Completed PHQ-2 or PHQ-9 in the last 360 days       omeprazole (PRILOSEC) 20 MG capsule 90 capsule 0    Sig: Take 1 capsule (20 mg total) by mouth daily.     Gastroenterology: Proton Pump Inhibitors Passed - 01/04/2023  4:25 PM      Passed - Valid encounter within last 12 months    Recent Outpatient Visits           9 months ago Annual physical exam   [place] [facility] Caro Laroche, DO   1 year ago Anxiety   [facility] [place], Marzella Schlein, MD   1 year ago Anxiety    [facility] [place], Marzella Schlein, MD   1 year ago Visit for annual health examination   [place] Erasmo Downer, MD   2 years ago Acute lower GI bleeding   [facility] Osvaldo Angst [place], [place]

## 2023-02-27 ENCOUNTER — Encounter: Payer: Self-pay | Admitting: Oncology

## 2023-02-27 ENCOUNTER — Other Ambulatory Visit: Payer: Self-pay

## 2023-04-03 ENCOUNTER — Encounter: Payer: Self-pay | Admitting: Family Medicine

## 2023-04-03 ENCOUNTER — Other Ambulatory Visit (HOSPITAL_COMMUNITY)
Admission: RE | Admit: 2023-04-03 | Discharge: 2023-04-03 | Disposition: A | Discharge status: Home / Self Care | Payer: Commercial Managed Care - PPO | Source: Ambulatory Visit | Attending: Family Medicine | Admitting: Family Medicine

## 2023-04-03 ENCOUNTER — Ambulatory Visit (INDEPENDENT_AMBULATORY_CARE_PROVIDER_SITE_OTHER): Payer: Commercial Managed Care - PPO | Admitting: Family Medicine

## 2023-04-03 ENCOUNTER — Other Ambulatory Visit: Payer: Self-pay

## 2023-04-03 VITALS — BP 128/78 | HR 82 | Ht 66.0 in | Wt 207.9 lb

## 2023-04-03 DIAGNOSIS — Z0000 Encounter for general adult medical examination without abnormal findings: Secondary | ICD-10-CM

## 2023-04-03 DIAGNOSIS — Z1231 Encounter for screening mammogram for malignant neoplasm of breast: Secondary | ICD-10-CM | POA: Diagnosis not present

## 2023-04-03 DIAGNOSIS — Z124 Encounter for screening for malignant neoplasm of cervix: Secondary | ICD-10-CM | POA: Diagnosis not present

## 2023-04-03 DIAGNOSIS — E7800 Pure hypercholesterolemia, unspecified: Secondary | ICD-10-CM | POA: Diagnosis not present

## 2023-04-03 DIAGNOSIS — R7303 Prediabetes: Secondary | ICD-10-CM | POA: Diagnosis not present

## 2023-04-03 DIAGNOSIS — F419 Anxiety disorder, unspecified: Secondary | ICD-10-CM

## 2023-04-03 DIAGNOSIS — F17200 Nicotine dependence, unspecified, uncomplicated: Secondary | ICD-10-CM

## 2023-04-03 DIAGNOSIS — K2100 Gastro-esophageal reflux disease with esophagitis, without bleeding: Secondary | ICD-10-CM

## 2023-04-03 DIAGNOSIS — D508 Other iron deficiency anemias: Secondary | ICD-10-CM | POA: Diagnosis not present

## 2023-04-03 DIAGNOSIS — Z6833 Body mass index (BMI) 33.0-33.9, adult: Secondary | ICD-10-CM | POA: Diagnosis not present

## 2023-04-03 DIAGNOSIS — E669 Obesity, unspecified: Secondary | ICD-10-CM | POA: Diagnosis not present

## 2023-04-03 DIAGNOSIS — H9193 Unspecified hearing loss, bilateral: Secondary | ICD-10-CM

## 2023-04-03 MED ORDER — FLUOXETINE HCL 20 MG PO CAPS
60.0000 mg | ORAL_CAPSULE | Freq: Every day | ORAL | 3 refills | Status: DC
Start: 2023-04-03 — End: 2024-04-03
  Filled 2023-04-03: qty 270, 90d supply, fill #0
  Filled 2023-07-06: qty 270, 90d supply, fill #1
  Filled 2023-10-09: qty 270, 90d supply, fill #2
  Filled 2024-01-02: qty 270, 90d supply, fill #3

## 2023-04-03 MED ORDER — NALTREXONE HCL 50 MG PO TABS
25.0000 mg | ORAL_TABLET | Freq: Every day | ORAL | 1 refills | Status: DC
  Filled 2023-04-03: qty 30, 60d supply, fill #0
  Filled 2023-05-30: qty 30, 60d supply, fill #1

## 2023-04-03 MED ORDER — OMEPRAZOLE 20 MG PO CPDR
20.0000 mg | DELAYED_RELEASE_CAPSULE | Freq: Every day | ORAL | 3 refills | Status: DC
  Filled 2023-04-03: qty 90, 90d supply, fill #0
  Filled 2023-07-13: qty 90, 90d supply, fill #1
  Filled 2023-10-09: qty 90, 90d supply, fill #2
  Filled 2024-01-02: qty 90, 90d supply, fill #3

## 2023-04-03 MED ORDER — BUPROPION HCL ER (XL) 150 MG PO TB24
150.0000 mg | ORAL_TABLET | Freq: Every day | ORAL | 2 refills | Status: DC
  Filled 2023-04-03: qty 30, 30d supply, fill #0
  Filled 2023-05-09 – 2023-05-30 (×2): qty 30, 30d supply, fill #1
  Filled 2023-06-30: qty 30, 30d supply, fill #2

## 2023-04-03 NOTE — Assessment & Plan Note (Signed)
Well controlled. Latest CBC and iron panel were wnl.  CTM

## 2023-04-03 NOTE — Assessment & Plan Note (Signed)
Previously well controlled. Last HbgA1c was 5.7 while on Wegovy.  CTM HbgA1c

## 2023-04-03 NOTE — Assessment & Plan Note (Addendum)
Previously had coverage for [facility] through insurance plan. Took for about a year and had weight loss success. Once d/c she gained weight back. Would like another option to aid in weight loss. If weight not improving, consider switching to weight neutral SSRI. Wellbutrin once daily in the AM Naltrexone once daily in the PM F/u in 83M to assess plan

## 2023-04-03 NOTE — Assessment & Plan Note (Signed)
Well controlled. Happy with Prozac.  Continue Prozac

## 2023-04-03 NOTE — Assessment & Plan Note (Signed)
Currently still using tobacco. Is not opposed to quitting. Happy that Wellbutrin may help her in quitting.  CTM Wellbutrin once daily in AM

## 2023-04-03 NOTE — Assessment & Plan Note (Addendum)
Well controlled. Last lipid panel was wn.  Continue statin  Lipid panel  CMP

## 2023-04-03 NOTE — Progress Notes (Signed)
Complete physical exam  Patient: Deborah Jenkins   DOB: 09/11/1955   67 y.o. Female  MRN: [id number]  Subjective:    Chief Complaint  Patient presents with   Annual Exam    Deborah Jenkins is a 67 y.o. female who presents today for a complete physical exam. She reports consuming a  general well balanced  diet. Home exercise routine includes yard work. She generally feels well. She reports sleeping well. She does have additional problems to discuss today.    Most recent fall risk assessment:    03/30/2022    3:18 PM  Fall Risk   Falls in the past year? 0  Number falls in past yr: 0  Injury with Fall? 0  Risk for fall due to : No Fall Risks  Follow up Falls evaluation completed     Most recent depression screenings:    03/30/2022    3:17 PM 11/05/2021    9:39 AM  PHQ 2/9 Scores  PHQ - 2 Score 0 4  PHQ- 9 Score 2 7    Patient Active Problem List   Diagnosis Date Noted   Prediabetes 08/02/2021   Iron deficiency anemia 01/04/2021   Other dysphagia    Gastric nodule    Atrophic gastritis without hemorrhage    Polyp of colon    Diverticulosis of colon with hemorrhage    Diverticulosis 11/14/2020   Acute blood loss anemia 11/14/2020   Obesity 03/22/2019   Psoriasis 03/22/2019   Anxiety 04/06/2018   Hyperlipidemia 04/06/2018   Tobacco use disorder 01/10/2018   GERD (gastroesophageal reflux disease) 01/10/2018   Allergic rhinitis 08/21/2015   Depression 02/19/2014      Patient Care Team: Erasmo Downer, MD as PCP - General (Family Medicine)   Outpatient Medications Prior to Visit  Medication Sig   albuterol (VENTOLIN HFA) 108 (90 Base) MCG/ACT inhaler Inhale 2 puffs into the lungs every 4 (four) hours as needed for wheezing or shortness of breath.   cholecalciferol (VITAMIN D3) 25 MCG (1000 UNIT) tablet Take 1,000 Units by mouth daily.   ferrous sulfate 325 (65 FE) MG EC tablet Take 325 mg by mouth daily with breakfast.   FLUoxetine (PROZAC) 20 MG capsule  Take 3 capsules (60 mg total) by mouth daily.   fluticasone (FLONASE) 50 MCG/ACT nasal spray Place 1 spray into both nostrils daily as needed for allergies or rhinitis.   hydrocortisone cream 0.5 % Apply 1 application topically daily as needed for itching (on elbows).   Melatonin 10 MG TABS Take 10 mg by mouth at bedtime.   omeprazole (PRILOSEC) 20 MG capsule Take 1 capsule (20 mg total) by mouth daily.   rosuvastatin (CRESTOR) 10 MG tablet Take 1 tablet (10 mg total) by mouth daily.   Semaglutide-Weight Management (WEGOVY) 1 MG/0.5ML SOAJ Inject 1 mg into the skin once a week. Please schedule office visit before any future refill.   No facility-administered medications prior to visit.    Review of Systems  Constitutional:  Positive for malaise/fatigue. Negative for weight loss.  HENT:  Positive for hearing loss and tinnitus. Negative for ear pain.           Objective:     BP 128/78 (BP Location: Right Arm, Patient Position: Sitting, Cuff Size: Large)   Pulse 82   Ht 5\' 6"  (1.676 m)   Wt 207 lb 14.4 oz (94.3 kg)   SpO2 100%   BMI 33.56 kg/m  BP Readings from Last 3 Encounters:  04/03/23 128/78  11/23/22 117/80  03/30/22 118/83   Wt Readings from Last 3 Encounters:  04/03/23 207 lb 14.4 oz (94.3 kg)  11/23/22 194 lb 9.6 oz (88.3 kg)  03/30/22 196 lb (88.9 kg)      Physical Exam Constitutional:      Appearance: Normal appearance.  HENT:     Head: Normocephalic and atraumatic.     Right Ear: Tympanic membrane, ear canal and external ear normal.     Left Ear: Tympanic membrane, ear canal and external ear normal.  Eyes:     Conjunctiva/sclera: Conjunctivae normal.     Pupils: Pupils are equal, round, and reactive to light.  Cardiovascular:     Rate and Rhythm: Normal rate and regular rhythm.     Heart sounds: Normal heart sounds.  Pulmonary:     Effort: Pulmonary effort is normal.     Breath sounds: Normal breath sounds.  Neurological:     General: No focal  deficit present.     Mental Status: She is alert and oriented to person, place, and time.      No results found for any visits on 04/03/23. Last CBC Lab Results  Component Value Date   WBC 5.6 11/22/2022   HGB 13.7 11/22/2022   HCT 43.4 11/22/2022   MCV 91.9 11/22/2022   MCH 29.0 11/22/2022   RDW 13.1 11/22/2022   PLT 358 11/22/2022   Last metabolic panel Lab Results  Component Value Date   GLUCOSE 82 03/30/2022   NA 138 03/30/2022   K 4.1 03/30/2022   CL 103 03/30/2022   CO2 20 03/30/2022   BUN 9 03/30/2022   CREATININE 0.83 03/30/2022   EGFR 79 03/30/2022   CALCIUM 9.5 03/30/2022   PROT 7.0 03/30/2022   ALBUMIN 4.5 03/30/2022   LABGLOB 2.5 03/30/2022   AGRATIO 1.8 03/30/2022   BILITOT 0.3 03/30/2022   ALKPHOS 71 03/30/2022   AST 24 03/30/2022   ALT 32 03/30/2022   ANIONGAP 11 09/22/2021   Last lipids Lab Results  Component Value Date   CHOL 164 03/30/2022   HDL 81 03/30/2022   LDLCALC 69 03/30/2022   TRIG 72 03/30/2022   CHOLHDL 2.0 03/30/2022   Last hemoglobin A1c Lab Results  Component Value Date   HGBA1C 5.7 (H) 03/30/2022        Assessment & Plan:    Routine Health Maintenance and Physical Exam  Immunization History  Administered Date(s) Administered   Influenza-Unspecified 06/07/2021   PFIZER(Purple Top)SARS-COV-2 Vaccination 09/17/2019, 10/08/2019   Tdap 06/02/2011, 10/22/2021    Health Maintenance  Topic Date Due   Zoster Vaccines- Shingrix (1 of 2) Never done   COVID-19 Vaccine (3 - Pfizer risk series) 11/05/2019   PAP SMEAR-Modifier  01/11/2023   INFLUENZA VACCINE  04/06/2023   MAMMOGRAM  03/02/2024   Colonoscopy  12/10/2030   DTaP/Tdap/Td (3 - Td or Tdap) 10/23/2031   Hepatitis C Screening  Completed   HIV Screening  Completed   HPV VACCINES  Aged Out    Discussed health benefits of physical activity, and encouraged her to engage in regular exercise appropriate for her age and condition.  Problem List Items Addressed This  Visit       Nervous and Auditory   Bilateral hearing loss    Patient has multiple year history of increasing hearing loss. She also has a constant humming in her ears. It is becoming harder for her to understand others in a crowd and hear her TV.  Referral  to audiology      Relevant Orders   Ambulatory referral to Audiology     Other   Iron deficiency anemia (Chronic)    Well controlled. Latest CBC and iron panel were wnl.  CTM      Tobacco use disorder    Currently still using tobacco. Is not opposed to quitting. Happy that Wellbutrin may help her in quitting.  CTM Wellbutrin once daily in AM      Anxiety    Well controlled. Happy with Prozac.  Continue Prozac      Relevant Medications   buPROPion (WELLBUTRIN XL) 150 MG 24 hr tablet   FLUoxetine (PROZAC) 20 MG capsule   Hyperlipidemia    Well controlled. Last lipid panel was wn.  Continue statin  Lipid panel  CMP      Relevant Orders   Comprehensive metabolic panel   Lipid panel   Obesity    Previously had coverage for [facility] through insurance plan. Took for about a year and had weight loss success. Once d/c she gained weight back. Would like another option to aid in weight loss. If weight not improving, consider switching to weight neutral SSRI. Wellbutrin once daily in the AM Naltrexone once daily in the PM F/u in 73M to assess plan      Prediabetes    Previously well controlled. Last HbgA1c was 5.7 while on Wegovy.  CTM HbgA1c      Relevant Orders   Hemoglobin A1c   Other Visit Diagnoses     Encounter for annual physical exam    -  Primary   Relevant Orders   Hemoglobin A1c   Comprehensive metabolic panel   Lipid panel   Breast cancer screening by mammogram       Relevant Orders   MM 3D SCREENING MAMMOGRAM BILATERAL BREAST   Cervical cancer screening       Relevant Orders   Cytology - PAP   Gastroesophageal reflux disease with esophagitis       Relevant Medications   omeprazole (PRILOSEC) 20  MG capsule      Return in about 3 months (around 07/04/2023) for chronic disease f/u, weight f/u.     Rometta Emery, Medical Student   Patient seen along with MS3 student Jodi Marble. I personally evaluated this patient along with the student, and verified all aspects of the history, physical exam, and medical decision making as documented by the student. I agree with the student's documentation and have made all necessary edits.  Shakhia Gramajo, Marzella Schlein, MD, MPH [facility]

## 2023-04-03 NOTE — Assessment & Plan Note (Signed)
Patient has multiple year history of increasing hearing loss. She also has a constant humming in her ears. It is becoming harder for her to understand others in a crowd and hear her TV.  Referral to audiology

## 2023-05-17 ENCOUNTER — Other Ambulatory Visit: Payer: Self-pay

## 2023-05-19 ENCOUNTER — Other Ambulatory Visit: Payer: Self-pay

## 2023-05-30 ENCOUNTER — Other Ambulatory Visit: Payer: Self-pay

## 2023-05-30 ENCOUNTER — Other Ambulatory Visit: Payer: Self-pay | Admitting: Family Medicine

## 2023-05-30 DIAGNOSIS — E7800 Pure hypercholesterolemia, unspecified: Secondary | ICD-10-CM

## 2023-05-31 ENCOUNTER — Other Ambulatory Visit: Payer: Self-pay

## 2023-05-31 MED FILL — Rosuvastatin Calcium Tab 10 MG: ORAL | 90 days supply | Qty: 90 | Fill #0 | Status: AC

## 2023-05-31 NOTE — Telephone Encounter (Signed)
Requested Prescriptions  Pending Prescriptions Disp Refills   rosuvastatin (CRESTOR) 10 MG tablet 90 tablet 2    Sig: Take 1 tablet (10 mg total) by mouth daily.     Cardiovascular:  Antilipid - Statins 2 Failed - 05/30/2023  5:54 PM      Failed - Lipid Panel in normal range within the last 12 months    Cholesterol, Total  Date Value Ref Range Status  04/03/2023 216 (H) 100 - 199 mg/dL Final   LDL Chol Calc (NIH)  Date Value Ref Range Status  04/03/2023 107 (H) 0 - 99 mg/dL Final   HDL  Date Value Ref Range Status  04/03/2023 98 >39 mg/dL Final   Triglycerides  Date Value Ref Range Status  04/03/2023 60 0 - 149 mg/dL Final         Passed - Cr in normal range and within 360 days    Creatinine, Ser  Date Value Ref Range Status  04/03/2023 0.83 0.57 - 1.00 mg/dL Final         Passed - Patient is not pregnant      Passed - Valid encounter within last 12 months    Recent Outpatient Visits           1 month ago Encounter for annual physical exam   [place] [facility] [place], Marzella Schlein, MD   1 year ago Annual physical exam   [facility] Caro Laroche, DO   1 year ago Anxiety   [place] [facility] [place], Marzella Schlein, MD   1 year ago Anxiety    [facility] [place], Marzella Schlein, MD   2 years ago Visit for annual health examination   [facility] [place] [facility] [place], Marzella Schlein, MD       Future Appointments             In 1 month Bac[place], Marzella Schlein, MD [facility]

## 2023-06-01 ENCOUNTER — Other Ambulatory Visit: Payer: Self-pay

## 2023-07-04 ENCOUNTER — Encounter: Payer: Self-pay | Admitting: Family Medicine

## 2023-07-04 ENCOUNTER — Ambulatory Visit (INDEPENDENT_AMBULATORY_CARE_PROVIDER_SITE_OTHER): Payer: Commercial Managed Care - PPO | Admitting: Family Medicine

## 2023-07-04 ENCOUNTER — Other Ambulatory Visit: Payer: Self-pay

## 2023-07-04 ENCOUNTER — Encounter: Payer: Self-pay | Admitting: Oncology

## 2023-07-04 VITALS — BP 122/78 | HR 84 | Ht 66.0 in | Wt 215.0 lb

## 2023-07-04 DIAGNOSIS — Z6834 Body mass index (BMI) 34.0-34.9, adult: Secondary | ICD-10-CM | POA: Diagnosis not present

## 2023-07-04 DIAGNOSIS — E66811 Obesity, class 1: Secondary | ICD-10-CM

## 2023-07-04 MED ORDER — TOPIRAMATE 50 MG PO TABS
ORAL_TABLET | ORAL | 0 refills | Status: DC
  Filled 2023-07-04: qty 49, 28d supply, fill #0

## 2023-07-04 MED ORDER — TOPIRAMATE 50 MG PO TABS
50.0000 mg | ORAL_TABLET | Freq: Two times a day (BID) | ORAL | 1 refills | Status: DC
  Filled 2023-07-04: qty 60, 30d supply, fill #0

## 2023-07-04 MED ORDER — PHENTERMINE HCL 30 MG PO CAPS
30.0000 mg | ORAL_CAPSULE | ORAL | 2 refills | Status: DC
  Filled 2023-07-04: qty 30, 30d supply, fill #0

## 2023-07-04 NOTE — Progress Notes (Signed)
Established Patient Office Visit  Subjective   Patient ID: Deborah Jenkins, female    DOB: 10-Jun-1956  Age: 67 y.o. MRN: [id number]  Chief Complaint  Patient presents with   Weight Management Screening    Pt stated--weight loss medication is not working.    HPI  Discussed the use of AI scribe software for clinical note transcription with the patient, who gave verbal consent to proceed.  History of Present Illness   The patient, with a history of obesity, presents for a follow-up visit after a month of treatment with Wellbutrin XL and naltrexone. Despite adherence to the medication regimen, the patient reports no noticeable weight loss and an increase in weight. The patient expresses dissatisfaction with the current treatment plan and a desire for more effective weight management strategies. The patient has previously responded well to Ozempic, experiencing significant weight loss, but this medication is not covered by her insurance.         ROS    Objective:     BP 122/78 (BP Location: Left Arm, Patient Position: Sitting, Cuff Size: Large)   Pulse 84   Ht 5\' 6"  (1.676 m)   Wt 215 lb (97.5 kg)   SpO2 100%   BMI 34.70 kg/m    Physical Exam Vitals reviewed.  Constitutional:      General: She is not in acute distress.    Appearance: Normal appearance. She is well-developed. She is not diaphoretic.  HENT:     Head: Normocephalic and atraumatic.  Eyes:     General: No scleral icterus.    Conjunctiva/sclera: Conjunctivae normal.  Neck:     Thyroid: No thyromegaly.  Cardiovascular:     Rate and Rhythm: Normal rate and regular rhythm.     Heart sounds: Normal heart sounds. No murmur heard. Pulmonary:     Effort: Pulmonary effort is normal. No respiratory distress.     Breath sounds: Normal breath sounds. No wheezing, rhonchi or rales.  Musculoskeletal:     Cervical back: Neck supple.     Right lower leg: No edema.     Left lower leg: No edema.  Lymphadenopathy:      Cervical: No cervical adenopathy.  Skin:    General: Skin is warm and dry.     Findings: No rash.  Neurological:     Mental Status: She is alert. Mental status is at baseline.  Psychiatric:        Mood and Affect: Mood normal.        Behavior: Behavior normal.      No results found for any visits on 07/04/23.    The 10-year ASCVD risk score (Arnett DK, et al., 2019) is: 11.4%    Assessment & Plan:   Problem List Items Addressed This Visit       Other   Obesity - Primary    Unsuccessful weight loss with Wellbutrin XL 150mg  and Naltrexone 25mg . Previous success with Ozempic, but not covered by insurance. Discussed alternative options and risks/benefits of phentermine and Topamax (Qsymia). -Discontinue Wellbutrin XL and Naltrexone. -Start Phentermine 30mg  in the morning for appetite suppression. -Start Topamax 50mg  at bedtime for one week, then increase to twice daily for craving suppression. -Check blood pressure regularly at home due to potential hypertensive effect of Phentermine. -Schedule follow-up in 3 months, but patient can message sooner if dose adjustment is needed.       Relevant Medications   phentermine 30 MG capsule      Return in about  3 months (around 10/04/2023) for chronic disease f/u, weight f/u.    Shirlee Latch, MD

## 2023-07-04 NOTE — Assessment & Plan Note (Signed)
Unsuccessful weight loss with Wellbutrin XL 150mg  and Naltrexone 25mg . Previous success with Ozempic, but not covered by insurance. Discussed alternative options and risks/benefits of phentermine and Topamax (Qsymia). -Discontinue Wellbutrin XL and Naltrexone. -Start Phentermine 30mg  in the morning for appetite suppression. -Start Topamax 50mg  at bedtime for one week, then increase to twice daily for craving suppression. -Check blood pressure regularly at home due to potential hypertensive effect of Phentermine. -Schedule follow-up in 3 months, but patient can message sooner if dose adjustment is needed.

## 2023-07-13 ENCOUNTER — Other Ambulatory Visit: Payer: Self-pay

## 2023-07-13 ENCOUNTER — Encounter: Payer: Self-pay | Admitting: Oncology

## 2023-07-24 ENCOUNTER — Telehealth: Payer: Commercial Managed Care - PPO | Admitting: Physician Assistant

## 2023-07-24 ENCOUNTER — Other Ambulatory Visit: Payer: Self-pay

## 2023-07-24 DIAGNOSIS — J019 Acute sinusitis, unspecified: Secondary | ICD-10-CM

## 2023-07-24 DIAGNOSIS — B9789 Other viral agents as the cause of diseases classified elsewhere: Secondary | ICD-10-CM

## 2023-07-24 MED ORDER — IPRATROPIUM BROMIDE 0.03 % NA SOLN
2.0000 | Freq: Two times a day (BID) | NASAL | 0 refills | Status: DC
Start: 2023-07-24 — End: 2023-10-05
  Filled 2023-07-24: qty 30, 43d supply, fill #0

## 2023-07-24 NOTE — Progress Notes (Signed)
E-Visit for Sinus Problems  We are sorry that you are not feeling well.  Here is how we plan to help!  Based on what you have shared with me it looks like you have sinusitis.  Sinusitis is inflammation and infection in the sinus cavities of the head.  Based on your presentation I believe you most likely have Acute Viral Sinusitis.This is an infection most likely caused by a virus. There is not specific treatment for viral sinusitis other than to help you with the symptoms until the infection runs its course.  You may use an oral decongestant such as Mucinex D or if you have glaucoma or high blood pressure use plain Mucinex. Saline nasal spray help and can safely be used as often as needed for congestion, I have prescribed: Ipratropium Bromide nasal spray 0.03% 2 sprays in eah nostril 2-3 times a day  Some authorities believe that zinc sprays or the use of Echinacea may shorten the course of your symptoms.  Sinus infections are not as easily transmitted as other respiratory infection, however we still recommend that you avoid close contact with loved ones, especially the very young and elderly.  Remember to wash your hands thoroughly throughout the day as this is the number one way to prevent the spread of infection!  Home Care: Only take medications as instructed by your medical team. Do not take these medications with alcohol. A steam or ultrasonic humidifier can help congestion.  You can place a towel over your head and breathe in the steam from hot water coming from a faucet. Avoid close contacts especially the very young and the elderly. Cover your mouth when you cough or sneeze. Always remember to wash your hands.  Get Help Right Away If: You develop worsening fever or sinus pain. You develop a severe head ache or visual changes. Your symptoms persist after you have completed your treatment plan.  Make sure you Understand these instructions. Will watch your condition. Will get help  right away if you are not doing well or get worse.   Thank you for choosing an e-visit.  Your e-visit answers were reviewed by a board certified advanced clinical practitioner to complete your personal care plan. Depending upon the condition, your plan could have included both over the counter or prescription medications.  Please review your pharmacy choice. Make sure the pharmacy is open so you can pick up prescription now. If there is a problem, you may contact your provider through [employer] and have the prescription routed to another pharmacy.  Your safety is important to [place]. If you have drug allergies check your prescription carefully.   For the next 24 hours you can use MyChart to ask questions about today's visit, request a non-urgent call back, or ask for a work or school excuse. You will get an email in the next two days asking about your experience. I hope that your e-visit has been valuable and will speed your recovery.  I have spent 5 minutes in review of e-visit questionnaire, review and updating patient chart, medical decision making and response to patient.   Mar Daring, PA-C

## 2023-08-07 ENCOUNTER — Other Ambulatory Visit: Payer: Self-pay

## 2023-08-16 ENCOUNTER — Encounter: Payer: Self-pay | Admitting: Oncology

## 2023-08-16 ENCOUNTER — Other Ambulatory Visit: Payer: Self-pay

## 2023-08-16 ENCOUNTER — Ambulatory Visit
Admission: EM | Admit: 2023-08-16 | Discharge: 2023-08-16 | Disposition: A | Discharge status: Home / Self Care | Payer: Medicare Other | Attending: Emergency Medicine | Admitting: Emergency Medicine

## 2023-08-16 DIAGNOSIS — J069 Acute upper respiratory infection, unspecified: Secondary | ICD-10-CM | POA: Diagnosis not present

## 2023-08-16 DIAGNOSIS — R062 Wheezing: Secondary | ICD-10-CM

## 2023-08-16 MED ORDER — BENZONATATE 100 MG PO CAPS
100.0000 mg | ORAL_CAPSULE | Freq: Three times a day (TID) | ORAL | 0 refills | Status: DC
  Filled 2023-08-16: qty 21, 7d supply, fill #0

## 2023-08-16 MED ORDER — AZITHROMYCIN 250 MG PO TABS
ORAL_TABLET | ORAL | 0 refills | Status: AC
  Filled 2023-08-16: qty 6, 5d supply, fill #0

## 2023-08-16 MED ORDER — PROMETHAZINE-DM 6.25-15 MG/5ML PO SYRP
5.0000 mL | ORAL_SOLUTION | Freq: Four times a day (QID) | ORAL | 0 refills | Status: DC | PRN
  Filled 2023-08-16: qty 118, 6d supply, fill #0

## 2023-08-16 MED ORDER — PREDNISONE 10 MG PO TABS
ORAL_TABLET | ORAL | 0 refills | Status: AC
  Filled 2023-08-16: qty 21, 6d supply, fill #0

## 2023-08-16 NOTE — ED Triage Notes (Signed)
Patient presents to UC for cough, post nasal drainage, facial pressure x 4 days. Treating symptoms with mucinex with no relief.     Denies fever.

## 2023-08-16 NOTE — ED Provider Notes (Signed)
Renaldo Fiddler    CSN: [id number] Arrival date & time: 08/16/23  4098      History   Chief Complaint Chief Complaint  Patient presents with   Cough    HPI Deborah Jenkins is a 67 y.o. female.   Presents for evaluation of a nonproductive cough with postnasal drip ,generalized facial pain, and nasal congestion present for 4 days.  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  Symptoms worsening progressively at nighttime contact interfering with sleep.  Denies presence of shortness of breath or wheezing.  Tempted use of Mucinex with minimal relief.  Past Medical History:  Diagnosis Date   Acute lower GI bleeding 11/13/2020   Depression 2006   GERD (gastroesophageal reflux disease) 2006   Iron deficiency anemia 01/04/2021    Patient Active Problem List   Diagnosis Date Noted   Bilateral hearing loss 04/03/2023   Prediabetes 08/02/2021   Iron deficiency anemia 01/04/2021   Other dysphagia    Gastric nodule    Atrophic gastritis without hemorrhage    Polyp of colon    Diverticulosis of colon with hemorrhage    Diverticulosis 11/14/2020   Acute blood loss anemia 11/14/2020   Obesity 03/22/2019   Psoriasis 03/22/2019   Anxiety 04/06/2018   Hyperlipidemia 04/06/2018   Tobacco use disorder 01/10/2018   GERD (gastroesophageal reflux disease) 01/10/2018   Allergic rhinitis 08/21/2015   Depression 02/19/2014    Past Surgical History:  Procedure Laterality Date   COLONOSCOPY WITH PROPOFOL N/A 12/09/2020   Procedure: COLONOSCOPY WITH PROPOFOL;  Surgeon: Pasty Spillers, MD;  Location: [facility] END[facility]COPY;  Service: Endoscopy;  Laterality: N/A;   ESOPHAGOGASTRODUODENOSCOPY (EGD) WITH PROPOFOL N/A 12/09/2020   Procedure: ESOPHAGOGASTRODUODENOSCOPY (EGD) WITH PROPOFOL;  Surgeon: Pasty Spillers, MD;  Location: [facility] ENDOSCOPY;  Service: Endoscopy;  Laterality: N/A;   FRACTURE SURGERY     TONSILLECTOMY Bilateral Childhood   TUBAL LIGATION     WRIST FRACTURE SURGERY  Right    in childhood    OB History   No obstetric history on file.      Home Medications    Prior to Admission medications   Medication Sig Start Date End Date Taking? Authorizing Provider  azithromycin (ZITHROMAX) 250 MG tablet Take 2 tablets (500 mg total) by mouth daily for 1 day, THEN 1 tablet (250 mg total) daily for 4 days. 08/16/23 08/21/23 Yes Strother Everitt R, NP  benzonatate (TESSALON) 100 MG capsule Take 1 capsule (100 mg total) by mouth every 8 (eight) hours. 08/16/23  Yes Tziporah Knoke R, NP  predniSONE (DELTASONE) 10 MG tablet Take 6 tablets (60 mg total) by mouth daily for 1 day, THEN 5 tablets (50 mg total) daily for 1 day, THEN 4 tablets (40 mg total) daily for 1 day, THEN 3 tablets (30 mg total) daily for 1 day, THEN 2 tablets (20 mg total) daily for 1 day, THEN 1 tablet (10 mg total) daily for 1 day. 08/16/23 08/22/23 Yes Telisha Zawadzki R, NP  promethazine-dextromethorphan (PROMETHAZINE-DM) 6.25-15 MG/5ML syrup Take 5 mLs by mouth 4 (four) times daily as needed for cough. 08/16/23  Yes Dionte Blaustein, Elita Boone, NP  albuterol (VENTOLIN HFA) 108 (90 Base) MCG/ACT inhaler Inhale 2 puffs into the lungs every 4 (four) hours as needed for wheezing or shortness of breath. 04/27/20   Mickie Bail, NP  cholecalciferol (VITAMIN D3) 25 MCG (1000 UNIT) tablet Take 1,000 Units by mouth daily.    [provider]  ferrous sulfate 325 (65  FE) MG EC tablet Take 325 mg by mouth daily with breakfast.    [provider]  FLUoxetine (PROZAC) 20 MG capsule Take 3 capsules (60 mg total) by mouth daily. 04/03/23   Erasmo Downer, MD  fluticasone (FLONASE) 50 MCG/ACT nasal spray Place 1 spray into both nostrils daily as needed for allergies or rhinitis. 03/29/21   Erasmo Downer, MD  hydrocortisone cream 0.5 % Apply 1 application topically daily as needed for itching (on elbows).    [provider]  ipratropium (ATROVENT) 0.03 % nasal spray Place 2 sprays into  both nostrils every 12 (twelve) hours. 07/24/23   Margaretann Loveless, PA-C  Melatonin 10 MG TABS Take 10 mg by mouth at bedtime.    [provider]  omeprazole (PRILOSEC) 20 MG capsule Take 1 capsule (20 mg total) by mouth daily. 04/03/23 04/02/24  Erasmo Downer, MD  phentermine 30 MG capsule Take 1 capsule (30 mg total) by mouth every morning. 07/04/23   Erasmo Downer, MD  rosuvastatin (CRESTOR) 10 MG tablet Take 1 tablet (10 mg total) by mouth daily. 05/31/23   Erasmo Downer, MD  topiramate (TOPAMAX) 50 MG tablet Take 1 tablet (50 mg total) by mouth at bedtime for 7 days, THEN 1 tablet (50 mg total) 2 (two) times daily for 21 days. 07/04/23 08/01/23  Erasmo Downer, MD  topiramate (TOPAMAX) 50 MG tablet Take 1 tablet (50 mg total) by mouth 2 (two) times daily. 07/04/23   Bacigalupo, Marzella Schlein, MD    Family History Family History  Problem Relation Age of Onset   Hypertension Mother    Diverticulosis Mother    Healthy Father    Hypertension Sister    Healthy Sister    Healthy Brother    Healthy Sister    Alcohol abuse Brother    Alcohol abuse Brother    Cirrhosis Brother    Cirrhosis Brother    Colon cancer Neg Hx    Breast cancer Neg Hx    Ovarian cancer Neg Hx    Cervical cancer Neg Hx     Social History Social History   Tobacco Use   Smoking status: Every Day    Current packs/day: 0.30    Average packs/day: 0.3 packs/day for 20.0 years (6.0 ttl pk-yrs)    Types: Cigarettes   Smokeless tobacco: Never   Tobacco comments:    started smoking at 67 YO; is smoking 1/3 PPD  Vaping Use   Vaping status: Never Used  Substance Use Topics   Alcohol use: No    Alcohol/week: 0.0 standard drinks of alcohol   Drug use: No     Allergies   Atorvastatin   Review of Systems Review of Systems  Respiratory:  Positive for cough.      Physical Exam Triage Vital Signs ED Triage Vitals  Encounter Vitals Group     BP 08/16/23 0901 (!) 149/89      Systolic BP Percentile --      Diastolic BP Percentile --      Pulse Rate 08/16/23 0901 90     Resp --      Temp 08/16/23 0901 98.1 F (36.7 C)     Temp Source 08/16/23 0901 Oral     SpO2 08/16/23 0901 94 %     Weight --      Height --      Head Circumference --      Peak Flow --  Pain Score 08/16/23 0914 0     Pain Loc --      Pain Education --      Exclude from Growth Chart --    No data found.  Updated Vital Signs BP (!) 149/89 (BP Location: Right Arm)   Pulse 90   Temp 98.1 F (36.7 C) (Oral)   SpO2 94%   Visual Acuity Right Eye Distance:   Left Eye Distance:   Bilateral Distance:    Right Eye Near:   Left Eye Near:    Bilateral Near:     Physical Exam Constitutional:      Appearance: Normal appearance.  HENT:     Right Ear: Tympanic membrane, ear canal and external ear normal.     Left Ear: Tympanic membrane, ear canal and external ear normal.     Nose: Congestion present. No rhinorrhea.     Right Sinus: No maxillary sinus tenderness.     Left Sinus: Maxillary sinus tenderness present.     Mouth/Throat:     Pharynx: No oropharyngeal exudate or posterior oropharyngeal erythema.  Eyes:     Extraocular Movements: Extraocular movements intact.  Cardiovascular:     Rate and Rhythm: Normal rate and regular rhythm.     Pulses: Normal pulses.     Heart sounds: Normal heart sounds.  Pulmonary:     Effort: Pulmonary effort is normal.     Breath sounds: Wheezing present.  Neurological:     Mental Status: She is alert.      UC Treatments / Results  Labs (all labs ordered are listed, but only abnormal results are displayed) Labs Reviewed - No data to display  EKG   Radiology No results found.  Procedures Procedures (including critical care time)  Medications Ordered in UC Medications - No data to display  Initial Impression / Assessment and Plan / UC Course  I have reviewed the triage vital signs and the nursing notes.  Pertinent labs &  imaging results that were available during my care of the patient were reviewed by me and considered in my medical decision making (see chart for details).  Acute URI, wheezing  Vitals are stable, patient is no signs of distress nontoxic-appearing, wheezing heard to auscultation however O2 saturation 94% on room air, stable for outpatient management , prescribed azithromycin, prednisone, Tessalon and Promethazine DM, use additional over-the-counter medication with follow-up as needed Final Clinical Impressions(s) / UC Diagnoses   Final diagnoses:  Acute URI  Wheezing     Discharge Instructions      Your symptoms today are most likely being caused by a virus and should steadily improve in time it can take up to 7 to 10 days before you truly start to see a turnaround however things will get better    You can take Tylenol and/or Ibuprofen as needed for fever reduction and pain relief.   For cough: honey 1/2 to 1 teaspoon (you can dilute the honey in water or another fluid).  You can also use guaifenesin and dextromethorphan for cough. You can use a humidifier for chest congestion and cough.  If you don't have a humidifier, you can sit in the bathroom with the hot shower running.      For sore throat: try warm salt water gargles, cepacol lozenges, throat spray, warm tea or water with lemon/honey, popsicles or ice, or OTC cold relief medicine for throat discomfort.   For congestion: take a daily anti-histamine like Zyrtec, Claritin, and a oral decongestant, such  as pseudoephedrine.  You can also use Flonase 1-2 sprays in each nostril daily.   It is important to stay hydrated: drink plenty of fluids (water, gatorade/powerade/pedialyte, juices, or teas) to keep your throat moisturized and help further relieve irritation/discomfort.    ED Prescriptions     Medication Sig Dispense Auth. Provider   predniSONE (DELTASONE) 10 MG tablet Take 6 tablets (60 mg total) by mouth daily for 1 day, THEN  5 tablets (50 mg total) daily for 1 day, THEN 4 tablets (40 mg total) daily for 1 day, THEN 3 tablets (30 mg total) daily for 1 day, THEN 2 tablets (20 mg total) daily for 1 day, THEN 1 tablet (10 mg total) daily for 1 day. 21 tablet Derrick Tiegs R, NP   benzonatate (TESSALON) 100 MG capsule Take 1 capsule (100 mg total) by mouth every 8 (eight) hours. 21 capsule Faisal Stradling R, NP   promethazine-dextromethorphan (PROMETHAZINE-DM) 6.25-15 MG/5ML syrup Take 5 mLs by mouth 4 (four) times daily as needed for cough. 118 mL Omar Gayden R, NP   azithromycin (ZITHROMAX) 250 MG tablet Take 2 tablets (500 mg total) by mouth daily for 1 day, THEN 1 tablet (250 mg total) daily for 4 days. 6 tablet Valinda Hoar, NP      PDMP not reviewed this encounter.   Valinda Hoar, NP 08/16/23 1310

## 2023-08-16 NOTE — Discharge Instructions (Addendum)
Your symptoms today are most likely being caused by a virus and should steadily improve in time it can take up to 7 to 10 days before you truly start to see a turnaround however things will get better    You can take Tylenol and/or Ibuprofen as needed for fever reduction and pain relief.   For cough: honey 1/2 to 1 teaspoon (you can dilute the honey in water or another fluid).  You can also use guaifenesin and dextromethorphan for cough. You can use a humidifier for chest congestion and cough.  If you don't have a humidifier, you can sit in the bathroom with the hot shower running.      For sore throat: try warm salt water gargles, cepacol lozenges, throat spray, warm tea or water with lemon/honey, popsicles or ice, or OTC cold relief medicine for throat discomfort.   For congestion: take a daily anti-histamine like Zyrtec, Claritin, and a oral decongestant, such as pseudoephedrine.  You can also use Flonase 1-2 sprays in each nostril daily.   It is important to stay hydrated: drink plenty of fluids (water, gatorade/powerade/pedialyte, juices, or teas) to keep your throat moisturized and help further relieve irritation/discomfort.

## 2023-09-28 ENCOUNTER — Encounter: Payer: Self-pay | Admitting: Oncology

## 2023-10-05 ENCOUNTER — Encounter: Payer: Self-pay | Admitting: Oncology

## 2023-10-05 ENCOUNTER — Ambulatory Visit (INDEPENDENT_AMBULATORY_CARE_PROVIDER_SITE_OTHER): Payer: Medicare Other | Admitting: Family Medicine

## 2023-10-05 ENCOUNTER — Encounter: Payer: Self-pay | Admitting: Family Medicine

## 2023-10-05 ENCOUNTER — Other Ambulatory Visit: Payer: Self-pay

## 2023-10-05 VITALS — BP 111/87 | HR 75 | Ht 67.0 in | Wt 222.9 lb

## 2023-10-05 DIAGNOSIS — R7303 Prediabetes: Secondary | ICD-10-CM

## 2023-10-05 DIAGNOSIS — E66811 Obesity, class 1: Secondary | ICD-10-CM

## 2023-10-05 DIAGNOSIS — Z1231 Encounter for screening mammogram for malignant neoplasm of breast: Secondary | ICD-10-CM | POA: Diagnosis not present

## 2023-10-05 DIAGNOSIS — E7800 Pure hypercholesterolemia, unspecified: Secondary | ICD-10-CM

## 2023-10-05 DIAGNOSIS — F331 Major depressive disorder, recurrent, moderate: Secondary | ICD-10-CM | POA: Insufficient documentation

## 2023-10-05 DIAGNOSIS — F419 Anxiety disorder, unspecified: Secondary | ICD-10-CM

## 2023-10-05 DIAGNOSIS — Z6834 Body mass index (BMI) 34.0-34.9, adult: Secondary | ICD-10-CM

## 2023-10-05 MED ORDER — METFORMIN HCL 500 MG PO TABS
500.0000 mg | ORAL_TABLET | Freq: Two times a day (BID) | ORAL | 1 refills | Status: DC
  Filled 2023-10-05: qty 180, 90d supply, fill #0

## 2023-10-05 NOTE — Assessment & Plan Note (Signed)
Currently on rosuvastatin 10 mg daily. Cholesterol levels previously in acceptable range. No recheck needed at this visit. - Continue rosuvastatin 10 mg daily - Recheck cholesterol levels annually

## 2023-10-05 NOTE — Assessment & Plan Note (Signed)
Chronic and stable Continue prozac 60mg  daily

## 2023-10-05 NOTE — Assessment & Plan Note (Signed)
Chronic and stable Conitnue prozac 60mg  daily Did not tolerate buspar Encourage therapy - discussed synergism with meds

## 2023-10-05 NOTE — Patient Instructions (Signed)
Please contact [phone] to schedule your mammogram. You will be asked your location preference to have procedure performed. You have two options listed below.  1) [facility] located at [address] [place], [place] [address] 2) [facility] located at [address] [place], [place] [address]  Upon results being received our office will contact you. As well as all results can be viewed through your MyChart. Please feel free to contact us if you have any further questions or concerns.

## 2023-10-05 NOTE — Progress Notes (Signed)
Established patient visit   Patient: Deborah Jenkins   DOB: Dec 03, 1955   68 y.o. Female  MRN: [id number] Visit Date: 10/05/2023  Today's healthcare provider: Shirlee Latch, MD   Chief Complaint  Patient presents with   Medical Management of Chronic Issues    3 month follow-up. Pt last seen 07/04/23   Diabetes   Hyperlipidemia   Hypertension   Obesity    Pt advised to discontinue Wellbutrin XL and Naltrexone, start Phentermine 30mg  in the morning for appetite suppression and start Topamax 50mg  at bedtime for one week, then increase to twice daily for craving suppression. Pt reports    Subjective    Diabetes  Hyperlipidemia  Hypertension   HPI     Medical Management of Chronic Issues    Additional comments: 3 month follow-up. Pt last seen 07/04/23        Diabetes   Current treatment includes none (Dietary and lifestyle changes).  Eye exam is not current.  Patient does not see a podiatrist.  The patient exercises three times a week.        Hyperlipidemia   Current therapy includes statins.        Obesity    Additional comments: Pt advised to discontinue Wellbutrin XL and Naltrexone, start Phentermine 30mg  in the morning for appetite suppression and start Topamax 50mg  at bedtime for one week, then increase to twice daily for craving suppression. Pt reports       Last edited by Acey Lav, CMA on 10/05/2023 10:28 AM.       Discussed the use of AI scribe software for clinical note transcription with the patient, who gave verbal consent to proceed.  History of Present Illness   The patient, with a history of prediabetes and menopause, presents with concerns about weight gain. They have tried various medications including phentermine, Topamax, and Contrave (Wellbutrin and naltrexone) without noticing any significant difference in weight or appetite. The patient was previously on [facility] for weight loss and had good results, but it is no longer  covered by their insurance. The patient expresses frustration with their weight gain, noting that they had to buy a new wardrobe after stopping Wegovy. They deny any other significant symptoms or health concerns.          10/05/2023   10:31 AM 07/04/2023    9:58 AM 04/03/2023    8:47 AM 03/30/2022    3:17 PM 11/05/2021    9:39 AM  Depression screen PHQ 2/9  Decreased Interest 0 0 0 0 2  Down, Depressed, Hopeless 0 0 0 0 2  PHQ - 2 Score 0 0 0 0 4  Altered sleeping  0  0 2  Tired, decreased energy  0  2 1  Change in appetite  0  0 0  Feeling bad or failure about yourself   0  0 0  Trouble concentrating  0  0 0  Moving slowly or fidgety/restless  0  0 0  Suicidal thoughts  0  0 0  PHQ-9 Score  0  2 7  Difficult doing work/chores  Not difficult at all  Not difficult at all Somewhat difficult       10/05/2023   10:31 AM 07/04/2023    9:58 AM 11/05/2021    9:40 AM 08/02/2021    8:16 AM  GAD 7 : Generalized Anxiety Score  Nervous, Anxious, on Edge 0 1 1 1   Control/stop worrying 0 0 0 1  Worry  too much - different things 0 1 1 3   Trouble relaxing 0 0 0 3  Restless 0 0 1 3  Easily annoyed or irritable 0 0 0 0  Afraid - awful might happen 0 0 1 2  Total GAD 7 Score 0 2 4 13   Anxiety Difficulty Not difficult at all Not difficult at all Not difficult at all Somewhat difficult       Medications: Outpatient Medications Prior to Visit  Medication Sig   albuterol (VENTOLIN HFA) 108 (90 Base) MCG/ACT inhaler Inhale 2 puffs into the lungs every 4 (four) hours as needed for wheezing or shortness of breath.   b complex vitamins capsule Take 1 capsule by mouth daily.   cholecalciferol (VITAMIN D3) 25 MCG (1000 UNIT) tablet Take 1,000 Units by mouth daily.   Ferrous Sulfate (IRON PO) Take 25 mg by mouth daily.   FLUoxetine (PROZAC) 20 MG capsule Take 3 capsules (60 mg total) by mouth daily.   fluticasone (FLONASE) 50 MCG/ACT nasal spray Place 1 spray into both nostrils daily as needed for  allergies or rhinitis.   hydrocortisone cream 0.5 % Apply 1 application topically daily as needed for itching (on elbows).   Melatonin 10 MG TABS Take 10 mg by mouth at bedtime.   omeprazole (PRILOSEC) 20 MG capsule Take 1 capsule (20 mg total) by mouth daily.   promethazine-dextromethorphan (PROMETHAZINE-DM) 6.25-15 MG/5ML syrup Take 5 mLs by mouth 4 (four) times daily as needed for cough.   rosuvastatin (CRESTOR) 10 MG tablet Take 1 tablet (10 mg total) by mouth daily.   [DISCONTINUED] benzonatate (TESSALON) 100 MG capsule Take 1 capsule (100 mg total) by mouth every 8 (eight) hours. (Patient not taking: Reported on 10/05/2023)   [DISCONTINUED] ferrous sulfate 325 (65 FE) MG EC tablet Take 325 mg by mouth daily with breakfast. (Patient not taking: Reported on 10/05/2023)   [DISCONTINUED] ipratropium (ATROVENT) 0.03 % nasal spray Place 2 sprays into both nostrils every 12 (twelve) hours. (Patient not taking: Reported on 10/05/2023)   [DISCONTINUED] phentermine 30 MG capsule Take 1 capsule (30 mg total) by mouth every morning. (Patient not taking: Reported on 10/05/2023)   [DISCONTINUED] topiramate (TOPAMAX) 50 MG tablet Take 1 tablet (50 mg total) by mouth at bedtime for 7 days, THEN 1 tablet (50 mg total) 2 (two) times daily for 21 days.   [DISCONTINUED] topiramate (TOPAMAX) 50 MG tablet Take 1 tablet (50 mg total) by mouth 2 (two) times daily. (Patient not taking: Reported on 10/05/2023)   No facility-administered medications prior to visit.    Review of Systems     Objective    BP 111/87 (BP Location: Right Arm, Patient Position: Sitting, Cuff Size: Large)   Pulse 75   Ht 5\' 7"  (1.702 m)   Wt 222 lb 14.4 oz (101.1 kg)   SpO2 99%   BMI 34.91 kg/m    Physical Exam Vitals reviewed.  Constitutional:      General: She is not in acute distress.    Appearance: Normal appearance. She is well-developed. She is not diaphoretic.  HENT:     Head: Normocephalic and atraumatic.  Eyes:      General: No scleral icterus.    Conjunctiva/sclera: Conjunctivae normal.  Neck:     Thyroid: No thyromegaly.  Cardiovascular:     Rate and Rhythm: Normal rate and regular rhythm.     Heart sounds: Normal heart sounds. No murmur heard. Pulmonary:     Effort: Pulmonary effort is normal. No respiratory  distress.     Breath sounds: Normal breath sounds. No wheezing, rhonchi or rales.  Musculoskeletal:     Cervical back: Neck supple.     Right lower leg: No edema.     Left lower leg: No edema.  Lymphadenopathy:     Cervical: No cervical adenopathy.  Skin:    General: Skin is warm and dry.     Findings: No rash.  Neurological:     Mental Status: She is alert and oriented to person, place, and time. Mental status is at baseline.  Psychiatric:        Mood and Affect: Mood normal.        Behavior: Behavior normal.      No results found for any visits on 10/05/23.  Assessment & Plan     Problem List Items Addressed This Visit       Other   Anxiety   Chronic and stable Conitnue prozac 60mg  daily Did not tolerate buspar Encourage therapy - discussed synergism with meds      Hyperlipidemia - Primary   Currently on rosuvastatin 10 mg daily. Cholesterol levels previously in acceptable range. No recheck needed at this visit. - Continue rosuvastatin 10 mg daily - Recheck cholesterol levels annually      Relevant Orders   Comprehensive metabolic panel   Obesity   Struggling with weight management despite previous treatments with phentermine, Topamax, and Contrave. Prediabetes and insulin resistance complicate weight management. Discussed potential benefits of metformin for weight loss and insulin sensitivity. Common side effects include gastrointestinal upset, which typically improve over time. Emphasized importance of monitoring kidney function while on metformin. Discussed that metformin is not as effective as [phone] but is a different approach worth trying. - Prescribe metformin  500 mg twice daily with food - Order labs to check A1c, kidney function, and liver function - Schedule follow-up in three months to assess response to metformin      Relevant Medications   metFORMIN (GLUCOPHAGE) 500 MG tablet   Prediabetes   Last A1c was 6.0, indicating prediabetes. Metformin expected to help with insulin resistance and potentially lower blood sugar levels. - Order A1c test - metformin as above - Monitor blood sugar levels and adjust treatment as necessary      Relevant Orders   Hemoglobin A1c   Comprehensive metabolic panel   Moderate episode of recurrent major depressive disorder (HCC)   Chronic and stable Continue prozac 60mg  daily      Other Visit Diagnoses       Breast cancer screening by mammogram       Relevant Orders   MM 3D SCREENING MAMMOGRAM BILATERAL BREAST          General Health Maintenance Up to date on Pap smears and colonoscopy. Received flu shot but declined shingles, pneumonia, and COVID-19 vaccines. Due for a mammogram in June. - Send mammogram order to [facility] - Encourage scheduling mammogram in June  Follow-up - Schedule follow-up in three months to assess metformin response - Schedule 'Welcome to [facility]' visit in six months.          Return in about 3 months (around 01/03/2024) for weight f/u and 32m Welcome to Medicare.       Shirlee Latch, MD  [facility] [phone] (phone) [phone] (fax)  [facility]

## 2023-10-05 NOTE — Assessment & Plan Note (Signed)
Struggling with weight management despite previous treatments with phentermine, Topamax, and Contrave. Prediabetes and insulin resistance complicate weight management. Discussed potential benefits of metformin for weight loss and insulin sensitivity. Common side effects include gastrointestinal upset, which typically improve over time. Emphasized importance of monitoring kidney function while on metformin. Discussed that metformin is not as effective as Wegovy but is a different approach worth trying. - Prescribe metformin 500 mg twice daily with food - Order labs to check A1c, kidney function, and liver function - Schedule follow-up in three months to assess response to metformin

## 2023-10-05 NOTE — Assessment & Plan Note (Signed)
Last A1c was 6.0, indicating prediabetes. Metformin expected to help with insulin resistance and potentially lower blood sugar levels. - Order A1c test - metformin as above - Monitor blood sugar levels and adjust treatment as necessary

## 2023-10-06 ENCOUNTER — Encounter: Payer: Self-pay | Admitting: Family Medicine

## 2023-10-06 LAB — COMPREHENSIVE METABOLIC PANEL
ALT: 19 [IU]/L (ref 0–32)
AST: 25 [IU]/L (ref 0–40)
Albumin: 4.5 g/dL (ref 3.9–4.9)
Alkaline Phosphatase: 87 [IU]/L (ref 44–121)
BUN/Creatinine Ratio: 9 — ABNORMAL LOW (ref 12–28)
BUN: 8 mg/dL (ref 8–27)
Bilirubin Total: 0.3 mg/dL (ref 0.0–1.2)
CO2: 23 mmol/L (ref 20–29)
Calcium: 9.6 mg/dL (ref 8.7–10.3)
Chloride: 104 mmol/L (ref 96–106)
Creatinine, Ser: 0.89 mg/dL (ref 0.57–1.00)
Globulin, Total: 2.4 g/dL (ref 1.5–4.5)
Glucose: 110 mg/dL — ABNORMAL HIGH (ref 70–99)
Potassium: 4.9 mmol/L (ref 3.5–5.2)
Sodium: 143 mmol/L (ref 134–144)
Total Protein: 6.9 g/dL (ref 6.0–8.5)
eGFR: 72 mL/min/{1.73_m2} (ref >59)

## 2023-10-06 LAB — HEMOGLOBIN A1C
Est. average glucose Bld gHb Est-mCnc: 131 mg/dL
Hgb A1c MFr Bld: 6.2 % — ABNORMAL HIGH (ref 4.8–5.6)

## 2023-10-09 MED FILL — Rosuvastatin Calcium Tab 10 MG: ORAL | 90 days supply | Qty: 90 | Fill #1 | Status: AC

## 2024-01-02 ENCOUNTER — Encounter: Payer: Self-pay | Admitting: Oncology

## 2024-01-02 ENCOUNTER — Ambulatory Visit (INDEPENDENT_AMBULATORY_CARE_PROVIDER_SITE_OTHER): Payer: Self-pay | Admitting: Family Medicine

## 2024-01-02 ENCOUNTER — Other Ambulatory Visit: Payer: Self-pay

## 2024-01-02 ENCOUNTER — Encounter: Payer: Self-pay | Admitting: Family Medicine

## 2024-01-02 VITALS — BP 130/70 | HR 77 | Ht 66.0 in | Wt 226.0 lb

## 2024-01-02 DIAGNOSIS — E7800 Pure hypercholesterolemia, unspecified: Secondary | ICD-10-CM

## 2024-01-02 DIAGNOSIS — Z6836 Body mass index (BMI) 36.0-36.9, adult: Secondary | ICD-10-CM

## 2024-01-02 DIAGNOSIS — R7303 Prediabetes: Secondary | ICD-10-CM

## 2024-01-02 DIAGNOSIS — F17200 Nicotine dependence, unspecified, uncomplicated: Secondary | ICD-10-CM | POA: Diagnosis not present

## 2024-01-02 DIAGNOSIS — F331 Major depressive disorder, recurrent, moderate: Secondary | ICD-10-CM | POA: Diagnosis not present

## 2024-01-02 DIAGNOSIS — E66812 Obesity, class 2: Secondary | ICD-10-CM

## 2024-01-02 MED ORDER — NALTREXONE HCL 50 MG PO TABS
25.0000 mg | ORAL_TABLET | Freq: Every day | ORAL | 3 refills | Status: DC
  Filled 2024-01-02: qty 15, 30d supply, fill #0
  Filled 2024-01-31: qty 15, 30d supply, fill #1
  Filled 2024-04-03: qty 15, 30d supply, fill #2

## 2024-01-02 MED ORDER — BUPROPION HCL ER (XL) 150 MG PO TB24
150.0000 mg | ORAL_TABLET | Freq: Every day | ORAL | 3 refills | Status: DC
  Filled 2024-01-02: qty 30, 30d supply, fill #0
  Filled 2024-01-31: qty 30, 30d supply, fill #1
  Filled 2024-04-03: qty 30, 30d supply, fill #2

## 2024-01-02 MED FILL — Rosuvastatin Calcium Tab 10 MG: ORAL | 90 days supply | Qty: 90 | Fill #2 | Status: AC

## 2024-01-02 NOTE — Assessment & Plan Note (Signed)
 Prediabetes with last A1c of 6.2 three months ago. She discontinued metformin  due to perceived weight gain and gastrointestinal side effects, including diarrhea. Engaging in lifestyle modifications such as walking three times a week and exercising regularly. She is determined to manage her weight through exercise and diet. - Recheck A1c in three months.

## 2024-01-02 NOTE — Assessment & Plan Note (Signed)
 Considering reinitiating Wellbutrin  and naltrexone  combination for weight management, which may also aid in smoking cessation. She previously experienced dry mouth with phentermine  and Topamax , and unsuccessful weight loss with Wellbutrin  and naltrexone  due to lack of exercise. She is now committed to combining medication with lifestyle changes. - Prescribe Wellbutrin  extended release 150 mg daily. - Prescribe naltrexone  25 mg daily (half a pill). - Send prescriptions to [facility].

## 2024-01-02 NOTE — Assessment & Plan Note (Signed)
 Depression managed with Prozac  60 mg daily.  well controlled

## 2024-01-02 NOTE — Progress Notes (Signed)
 Established patient visit   Patient: Deborah Jenkins   DOB: Dec 26, 1955   68 y.o. Female  MRN: [id number] Visit Date: 01/02/2024  Today's healthcare provider: Aden Agreste, MD   Chief Complaint  Patient presents with   Medical Management of Chronic Issues   Obesity    Patient reports stopped taking due to weight gain while on medication. Reports she has not taken in approx 1 month   Diabetes   Subjective    Diabetes   HPI     Obesity    Additional comments: Patient reports stopped taking due to weight gain while on medication. Reports she has not taken in approx 1 month      Last edited by Pasty Bongo, CMA on 01/02/2024  9:56 AM.       Discussed the use of AI scribe software for clinical note transcription with the patient, who gave verbal consent to proceed.  History of Present Illness   Deborah Jenkins is a 68 year old female with prediabetes who presents for chronic follow-up.  She discontinued metformin  due to weight gain and diarrhea. Her last A1c was 6.2 three months ago. She is focusing on lifestyle changes, including walking three times a week and regular exercise, which she feels aids in weight loss. She has a history of unsuccessful weight loss attempts with medications such as Wellbutrin  and naltrexone , and phentermine  and Topamax , which caused dry mouth. She is interested in retrying Wellbutrin  and naltrexone  now that she is more active. She currently takes Crestor  10 mg daily and Prozac  60 mg daily, and is compliant with her regimen. She is reducing smoking as it interferes with her walking routine and is motivated to continue this reduction.         Medications: Outpatient Medications Prior to Visit  Medication Sig   albuterol  (VENTOLIN  HFA) 108 (90 Base) MCG/ACT inhaler Inhale 2 puffs into the lungs every 4 (four) hours as needed for wheezing or shortness of breath.   b complex vitamins capsule Take 1 capsule by mouth daily.    cholecalciferol  (VITAMIN D3) 25 MCG (1000 UNIT) tablet Take 1,000 Units by mouth daily.   Ferrous Sulfate (IRON  PO) Take 25 mg by mouth daily.   FLUoxetine  (PROZAC ) 20 MG capsule Take 3 capsules (60 mg total) by mouth daily.   fluticasone  (FLONASE ) 50 MCG/ACT nasal spray Place 1 spray into both nostrils daily as needed for allergies or rhinitis.   hydrocortisone cream 0.5 % Apply 1 application topically daily as needed for itching (on elbows).   Melatonin 10 MG TABS Take 10 mg by mouth at bedtime.   omeprazole  (PRILOSEC) 20 MG capsule Take 1 capsule (20 mg total) by mouth daily.   promethazine -dextromethorphan (PROMETHAZINE -DM) 6.25-15 MG/5ML syrup Take 5 mLs by mouth 4 (four) times daily as needed for cough.   rosuvastatin  (CRESTOR ) 10 MG tablet Take 1 tablet (10 mg total) by mouth daily.   [DISCONTINUED] metFORMIN  (GLUCOPHAGE ) 500 MG tablet Take 1 tablet (500 mg total) by mouth 2 (two) times daily with a meal. (Patient not taking: Reported on 01/02/2024)   No facility-administered medications prior to visit.    Review of Systems     Objective    BP 130/70 (BP Location: Left Arm, Patient Position: Sitting, Cuff Size: Large)   Pulse 77   Ht 5\' 6"  (1.676 m)   Wt 226 lb (102.5 kg)   SpO2 98%   BMI 36.48 kg/m    Physical Exam Vitals reviewed.  Constitutional:      General: She is not in acute distress.    Appearance: Normal appearance. She is well-developed. She is not diaphoretic.  HENT:     Head: Normocephalic and atraumatic.  Eyes:     General: No scleral icterus.    Conjunctiva/sclera: Conjunctivae normal.  Neck:     Thyroid : No thyromegaly.  Cardiovascular:     Rate and Rhythm: Normal rate and regular rhythm.     Heart sounds: Normal heart sounds. No murmur heard. Pulmonary:     Effort: Pulmonary effort is normal. No respiratory distress.     Breath sounds: Normal breath sounds. No wheezing, rhonchi or rales.  Musculoskeletal:     Cervical back: Neck supple.     Right  lower leg: No edema.     Left lower leg: No edema.  Lymphadenopathy:     Cervical: No cervical adenopathy.  Skin:    General: Skin is warm and dry.     Findings: No rash.  Neurological:     Mental Status: She is alert and oriented to person, place, and time. Mental status is at baseline.  Psychiatric:        Mood and Affect: Mood normal.        Behavior: Behavior normal.      No results found for any visits on 01/02/24.  Assessment & Plan     Problem List Items Addressed This Visit       Other   Depression   Depression managed with Prozac  60 mg daily.  well controlled      Relevant Medications   buPROPion  (WELLBUTRIN  XL) 150 MG 24 hr tablet   Tobacco use disorder   Nicotine dependence with reduction in smoking due to increased physical activity. Wellbutrin  may assist in reducing cravings for smoking, potentially aiding in cessation.       Hyperlipidemia   Hyperlipidemia managed with Crestor  10 mg daily. Last cholesterol check was almost a year ago. She is adherent to medication regimen and plans to continue until the next evaluation. - Check cholesterol levels in three months.      Obesity, morbid (HCC)   Considering reinitiating Wellbutrin  and naltrexone  combination for weight management, which may also aid in smoking cessation. She previously experienced dry mouth with phentermine  and Topamax , and unsuccessful weight loss with Wellbutrin  and naltrexone  due to lack of exercise. She is now committed to combining medication with lifestyle changes. - Prescribe Wellbutrin  extended release 150 mg daily. - Prescribe naltrexone  25 mg daily (half a pill). - Send prescriptions to [facility].      Prediabetes - Primary   Prediabetes with last A1c of 6.2 three months ago. She discontinued metformin  due to perceived weight gain and gastrointestinal side effects, including diarrhea. Engaging in lifestyle modifications such as walking three times a week and exercising  regularly. She is determined to manage her weight through exercise and diet. - Recheck A1c in three months.       Return in about 3 months (around 04/02/2024) for welcome to medicare.       Aden Agreste, MD  [facility] [phone] (phone) [phone] (fax)  [facility]

## 2024-01-02 NOTE — Assessment & Plan Note (Signed)
 Nicotine dependence with reduction in smoking due to increased physical activity. Wellbutrin  may assist in reducing cravings for smoking, potentially aiding in cessation.

## 2024-01-02 NOTE — Assessment & Plan Note (Signed)
 Hyperlipidemia managed with Crestor  10 mg daily. Last cholesterol check was almost a year ago. She is adherent to medication regimen and plans to continue until the next evaluation. - Check cholesterol levels in three months.

## 2024-01-03 ENCOUNTER — Other Ambulatory Visit: Payer: Self-pay

## 2024-02-05 ENCOUNTER — Other Ambulatory Visit: Payer: Self-pay

## 2024-02-23 ENCOUNTER — Other Ambulatory Visit: Payer: Self-pay

## 2024-03-12 ENCOUNTER — Ambulatory Visit
Admission: RE | Admit: 2024-03-12 | Discharge: 2024-03-12 | Disposition: A | Discharge status: Home / Self Care | Source: Ambulatory Visit | Attending: Family Medicine | Admitting: Family Medicine

## 2024-03-12 DIAGNOSIS — Z1231 Encounter for screening mammogram for malignant neoplasm of breast: Secondary | ICD-10-CM | POA: Insufficient documentation

## 2024-03-19 ENCOUNTER — Ambulatory Visit: Payer: Self-pay | Admitting: Family Medicine

## 2024-04-02 ENCOUNTER — Encounter: Payer: Self-pay | Admitting: Family Medicine

## 2024-04-02 ENCOUNTER — Encounter: Payer: Self-pay | Admitting: Oncology

## 2024-04-02 ENCOUNTER — Ambulatory Visit (INDEPENDENT_AMBULATORY_CARE_PROVIDER_SITE_OTHER): Admitting: Family Medicine

## 2024-04-02 VITALS — BP 132/78 | HR 82 | Ht 66.0 in | Wt 227.0 lb

## 2024-04-02 DIAGNOSIS — Z0000 Encounter for general adult medical examination without abnormal findings: Secondary | ICD-10-CM

## 2024-04-02 NOTE — Progress Notes (Signed)
 Medicare Initial Preventative Physical Exam    Patient: Deborah Jenkins, Female    DOB: 1955/09/28, 68 y.o.   MRN: [id number] Visit Date: 04/02/2024  Today's Provider: Jon Eva, MD   Chief Complaint  Patient presents with   Medicare Wellness    Welcome to Medicare.   Subjective    Medicare Initial Preventative Physical Exam [name] is a 68 y.o. female who presents today for her Initial Preventative Physical Exam.   Discussed the use of AI scribe software for clinical note transcription with the patient, who gave verbal consent to proceed.  History of Present Illness   [name] is a 68 year old female who presents for a Welcome to Medicare visit.  She seeks to establish a healthcare baseline as she transitions into Medicare. Her recent preventive screenings include a normal Pap smear last year, a normal colonoscopy in 2022, and a recent mammogram. Her bone density scan is current with osteoporosis screening. She is content with her current vaccination status, having previously discussed the shingles and pneumonia vaccines. She faces challenges finding a dentist under Medicaid, impacting her regular dental care.         Social History   Socioeconomic History   Marital status: Single    Spouse name: Not on file   Number of children: 2   Years of education: 14   Highest education level: Associate degree: academic program  Occupational History   Occupation: Manufacturing engineer: [employer]    Comment: [employer]  Tobacco Use   Smoking status: Every Day    Current packs/day: 0.30    Average packs/day: 0.3 packs/day for 20.0 years (6.0 ttl pk-yrs)    Types: Cigarettes   Smokeless tobacco: Never   Tobacco comments:    started smoking at 68 YO; is smoking 1/3 PPD  Vaping Use   Vaping status: Never Used  Substance and Sexual Activity   Alcohol use: No    Alcohol/week: 0.0 standard drinks of alcohol   Drug use: No   Sexual activity: Yes     Birth control/protection: Post-menopausal  Other Topics Concern   Not on file  Social History Narrative      Social Drivers of Health   Financial Resource Strain: Low Risk  (04/02/2024)   Overall Financial Resource Strain (CARDIA)    Difficulty of Paying Living Expenses: Not hard at all  Food Insecurity: No Food Insecurity (04/02/2024)   Hunger Vital Sign    Worried About Running Out of Food in the Last Year: Never true    Ran Out of Food in the Last Year: Never true  Transportation Needs: Not on file  Physical Activity: Sufficiently Active (04/02/2024)   Exercise Vital Sign    Days of Exercise per Week: 7 days    Minutes of Exercise per Session: 90 min  Stress: No Stress Concern Present (04/02/2024)   [employer] of Occupational Health - Occupational Stress Questionnaire    Feeling of Stress: Only a little  Social Connections: Moderately Integrated (04/02/2024)   Social Connection and Isolation Panel    Frequency of Communication with Friends and Family: More than three times a week    Frequency of Social Gatherings with Friends and Family: More than three times a week    Attends Religious Services: More than 4 times per year    Active Member of [employer] or Organizations: Yes    Attends Banker Meetings: Never    Marital Status: Divorced  Intimate Partner Violence: Not At Risk (04/02/2024)   Humiliation, Afraid, Rape, and Kick questionnaire    Fear of Current or Ex-Partner: No    Emotionally Abused: No    Physically Abused: No    Sexually Abused: No    Past Medical History:  Diagnosis Date   Acute lower GI bleeding 11/13/2020   Depression 2006   GERD (gastroesophageal reflux disease) 2006   Iron  deficiency anemia 01/04/2021     Patient Active Problem List   Diagnosis Date Noted   Moderate episode of recurrent major depressive disorder (HCC) 10/05/2023   Bilateral hearing loss 04/03/2023   Prediabetes 08/02/2021   Iron  deficiency anemia 01/04/2021   Other  dysphagia    Gastric nodule    Atrophic gastritis without hemorrhage    Polyp of colon    Diverticulosis of colon with hemorrhage    Diverticulosis 11/14/2020   Acute blood loss anemia 11/14/2020   Obesity, morbid (HCC) 03/22/2019   Psoriasis 03/22/2019   Anxiety 04/06/2018   Hyperlipidemia 04/06/2018   Tobacco use disorder 01/10/2018   GERD (gastroesophageal reflux disease) 01/10/2018   Allergic rhinitis 08/21/2015   Depression 02/19/2014    Past Surgical History:  Procedure Laterality Date   COLONOSCOPY WITH PROPOFOL  N/A 12/09/2020   Procedure: COLONOSCOPY WITH PROPOFOL ;  Surgeon: Janalyn Keene NOVAK, MD;  Location: [facility] END[facility]COPY;  Service: Endoscopy;  Laterality: N/A;   ESOPHAGOGASTRODUODENOSCOPY (EGD) WITH PROPOFOL  N/A 12/09/2020   Procedure: ESOPHAGOGASTRODUODENOSCOPY (EGD) WITH PROPOFOL ;  Surgeon: Janalyn Keene NOVAK, MD;  Location: [facility] ENDOSCOPY;  Service: Endoscopy;  Laterality: N/A;   FRACTURE SURGERY     TONSILLECTOMY Bilateral Childhood   TUBAL LIGATION     WRIST FRACTURE SURGERY Right    in childhood    Her family history includes Alcohol abuse in her brother and brother; Cirrhosis in her brother and brother; Diverticulosis in her mother; Healthy in her brother, father, and sister; Hypertension in her mother and sister. There is no history of Colon cancer, Breast cancer, Ovarian cancer, or Cervical cancer.   Current Outpatient Medications:    albuterol  (VENTOLIN  HFA) 108 (90 Base) MCG/ACT inhaler, Inhale 2 puffs into the lungs every 4 (four) hours as needed for wheezing or shortness of breath., Disp: 18 g, Rfl: 0   b complex vitamins capsule, Take 1 capsule by mouth daily., Disp: , Rfl:    buPROPion  (WELLBUTRIN  XL) 150 MG 24 hr tablet, Take 1 tablet (150 mg total) by mouth daily., Disp: 30 tablet, Rfl: 3   cholecalciferol  (VITAMIN D3) 25 MCG (1000 UNIT) tablet, Take 1,000 Units by mouth daily., Disp: , Rfl:    Ferrous Sulfate (IRON  PO), Take 25 mg by mouth daily.,  Disp: , Rfl:    FLUoxetine  (PROZAC ) 20 MG capsule, Take 3 capsules (60 mg total) by mouth daily., Disp: 270 capsule, Rfl: 3   fluticasone  (FLONASE ) 50 MCG/ACT nasal spray, Place 1 spray into both nostrils daily as needed for allergies or rhinitis., Disp: 16 g, Rfl: 11   hydrocortisone cream 0.5 %, Apply 1 application topically daily as needed for itching (on elbows)., Disp: , Rfl:    Melatonin 10 MG TABS, Take 10 mg by mouth at bedtime., Disp: , Rfl:    naltrexone  (DEPADE) 50 MG tablet, Take 0.5 tablets (25 mg total) by mouth daily., Disp: 15 tablet, Rfl: 3   omeprazole  (PRILOSEC) 20 MG capsule, Take 1 capsule (20 mg total) by mouth daily., Disp: 90 capsule, Rfl: 3   rosuvastatin  (CRESTOR ) 10 MG tablet, Take 1 tablet (  10 mg total) by mouth daily., Disp: 90 tablet, Rfl: 2   UNABLE TO FIND, Med Name: Alpha Sculpt- Weight Loss Supplement, Disp: , Rfl:    Patient Care Team: Myrla Jon HERO, MD as PCP - General (Family Medicine)  Review of Systems     Objective    Vitals: BP 132/78   Pulse 82   Ht 5' 6 (1.676 m)   Wt 227 lb (103 kg)   SpO2 98%   BMI 36.64 kg/m  Vision Screening   Right eye Left eye Both eyes  Without correction 20/70 20/25 20/30   With correction      Physical Exam Vitals reviewed.  Constitutional:      General: She is not in acute distress.    Appearance: Normal appearance. She is well-developed. She is not diaphoretic.  HENT:     Head: Normocephalic and atraumatic.     Right Ear: Tympanic membrane, ear canal and external ear normal.     Left Ear: Tympanic membrane, ear canal and external ear normal.     Nose: Nose normal.     Mouth/Throat:     Mouth: Mucous membranes are moist.     Pharynx: Oropharynx is clear. No oropharyngeal exudate.  Eyes:     General: No scleral icterus.    Conjunctiva/sclera: Conjunctivae normal.     Pupils: Pupils are equal, round, and reactive to light.  Neck:     Thyroid : No thyromegaly.  Cardiovascular:     Rate and  Rhythm: Normal rate and regular rhythm.     Heart sounds: Normal heart sounds. No murmur heard. Pulmonary:     Effort: Pulmonary effort is normal. No respiratory distress.     Breath sounds: Normal breath sounds. No wheezing or rales.  Abdominal:     General: There is no distension.     Palpations: Abdomen is soft.     Tenderness: There is no abdominal tenderness.  Musculoskeletal:        General: No deformity.     Cervical back: Neck supple.     Right lower leg: No edema.     Left lower leg: No edema.  Lymphadenopathy:     Cervical: No cervical adenopathy.  Skin:    General: Skin is warm and dry.     Findings: No rash.  Neurological:     Mental Status: She is alert and oriented to person, place, and time. Mental status is at baseline.     Gait: Gait normal.  Psychiatric:        Mood and Affect: Mood normal.        Behavior: Behavior normal.        Thought Content: Thought content normal.      Activities of Daily Living    04/02/2024    9:32 AM  In your present state of health, do you have any difficulty performing the following activities:  Hearing? 1  Vision? 1  Difficulty concentrating or making decisions? 1  Walking or climbing stairs? 0  Dressing or bathing? 0  Doing errands, shopping? 0  Preparing Food and eating ? N  Using the Toilet? N  In the past six months, have you accidently leaked urine? N  Do you have problems with loss of bowel control? N  Managing your Medications? N  Managing your Finances? N  Housekeeping or managing your Housekeeping? N    Fall Risk Assessment    04/02/2024    9:07 AM 10/05/2023   10:32 AM 04/03/2023  8:47 AM 03/30/2022    3:18 PM 11/05/2021    9:39 AM  Fall Risk   Falls in the past year? 0 0 0 0 0  Number falls in past yr: 0 0  0 0  Injury with Fall? 0 0 0 0 0  Risk for fall due to : No Fall Risks No Fall Risks No Fall Risks No Fall Risks No Fall Risks  Follow up Falls evaluation completed Falls evaluation completed Falls  evaluation completed Falls evaluation completed  Falls evaluation completed      Data saved with a previous flowsheet row definition     Depression Screen    04/02/2024    9:08 AM 01/02/2024    9:55 AM 10/05/2023   10:31 AM 07/04/2023    9:58 AM  PHQ 2/9 Scores  PHQ - 2 Score 0 0 0 0  PHQ- 9 Score 0   0       04/02/2024    9:33 AM  6CIT Screen  What Year? 0 points  What month? 0 points  What time? 0 points  Count back from 20 0 points  Months in reverse 0 points  Repeat phrase 2 points  Total Score 2 points    EKG: NSR  Vision Screening   Right eye Left eye Both eyes  Without correction 20/70 20/25 20/30   With correction        No results found for any visits on 04/02/24.  Assessment & Plan      Initial Preventative Physical Exam  Reviewed patient's Family Medical History Reviewed and updated list of patient's medical providers Assessment of cognitive impairment was done Assessed patient's functional ability Established a written schedule for health screening services Health Risk Assessent Completed and Reviewed  Exercise Activities and Dietary recommendations  Goals      Increase physical activity     Patient Stated     Patient would like to FULLY RETIRE.     Weight (lb) < 200 lb (90.7 kg)        Immunization History  Administered Date(s) Administered   Influenza-Unspecified 06/07/2021, 06/12/2023   PFIZER(Purple Top)SARS-COV-2 Vaccination 09/17/2019, 10/08/2019   Tdap 06/02/2011, 10/22/2021    Health Maintenance  Topic Date Due   Pneumococcal Vaccine: 50+ Years (1 of 2 - PCV) Never done   Zoster Vaccines- Shingrix (1 of 2) Never done   COVID-19 Vaccine (3 - Pfizer risk series) 11/05/2019   INFLUENZA VACCINE  04/05/2024   Medicare Annual Wellness (AWV)  04/02/2025   MAMMOGRAM  03/12/2026   Cervical Cancer Screening (HPV/Pap Cotest)  04/02/2028   Colonoscopy  12/10/2030   DTaP/Tdap/Td (3 - Td or Tdap) 10/23/2031   DEXA SCAN  Completed    Hepatitis C Screening  Completed   HIV Screening  Completed   Hepatitis B Vaccines  Aged Out   HPV VACCINES  Aged Out   Meningococcal B Vaccine  Aged Out     Discussed health benefits of physical activity, and encouraged her to engage in regular exercise appropriate for her age and condition.   Problem List Items Addressed This Visit   None Visit Diagnoses       Welcome to Medicare preventive visit    -  Primary   Relevant Orders   EKG 12-Lead           Adult Wellness Visit Undergoing Welcome to Medicare visit to establish a baseline for future annual wellness visits. EKG results were normal. No labs conducted during this visit  as she is not included in the Welcome to Medicare visit, but will be done in six months. Screening for cervical cancer is complete as she had a normal Pap smear last year and is over 65. Colonoscopy was performed in 2022 with normal results, and the next one is due in 2032. Mammogram was recently completed and will continue annually. Bone density scan has been done and is satisfactory. Shingles and pneumonia vaccines were discussed, and she is content with her current vaccination status. - Schedule follow-up in six months for labs - Continue annual mammograms - Schedule next colonoscopy for 2032 - No further Pap smears needed - Discuss shingles and pneumonia vaccines as needed - Encourage finding a dentist for regular dental check-ups       Return in about 6 months (around 10/03/2024) for chronic disease f/u.     Jon Eva, MD  [facility] [phone] (phone) [phone] (fax)  [facility]

## 2024-04-03 ENCOUNTER — Other Ambulatory Visit: Payer: Self-pay

## 2024-04-03 ENCOUNTER — Other Ambulatory Visit: Payer: Self-pay | Admitting: Family Medicine

## 2024-04-03 DIAGNOSIS — F419 Anxiety disorder, unspecified: Secondary | ICD-10-CM

## 2024-04-03 DIAGNOSIS — K2100 Gastro-esophageal reflux disease with esophagitis, without bleeding: Secondary | ICD-10-CM

## 2024-04-03 DIAGNOSIS — E7800 Pure hypercholesterolemia, unspecified: Secondary | ICD-10-CM

## 2024-04-03 MED ORDER — FLUOXETINE HCL 20 MG PO CAPS
60.0000 mg | ORAL_CAPSULE | Freq: Every day | ORAL | 3 refills | Status: DC
  Filled 2024-04-03: qty 270, 90d supply, fill #0

## 2024-04-03 MED ORDER — OMEPRAZOLE 20 MG PO CPDR
20.0000 mg | DELAYED_RELEASE_CAPSULE | Freq: Every day | ORAL | 3 refills | Status: AC
  Filled 2024-04-03: qty 90, 90d supply, fill #0
  Filled 2024-07-17: qty 90, 90d supply, fill #1

## 2024-04-03 MED ORDER — ROSUVASTATIN CALCIUM 10 MG PO TABS
10.0000 mg | ORAL_TABLET | Freq: Every day | ORAL | 3 refills | Status: AC
  Filled 2024-04-03: qty 90, 90d supply, fill #0
  Filled 2024-08-26: qty 90, 90d supply, fill #1

## 2024-04-04 ENCOUNTER — Encounter: Payer: Self-pay | Admitting: Family Medicine

## 2024-05-21 ENCOUNTER — Other Ambulatory Visit: Payer: Self-pay

## 2024-05-21 ENCOUNTER — Encounter: Payer: Self-pay | Admitting: Oncology

## 2024-05-21 ENCOUNTER — Ambulatory Visit
Admission: EM | Admit: 2024-05-21 | Discharge: 2024-05-21 | Disposition: A | Discharge status: Home / Self Care | Attending: Emergency Medicine | Admitting: Emergency Medicine

## 2024-05-21 DIAGNOSIS — J209 Acute bronchitis, unspecified: Secondary | ICD-10-CM

## 2024-05-21 DIAGNOSIS — J0100 Acute maxillary sinusitis, unspecified: Secondary | ICD-10-CM | POA: Diagnosis not present

## 2024-05-21 MED ORDER — AMOXICILLIN-POT CLAVULANATE 875-125 MG PO TABS
1.0000 | ORAL_TABLET | Freq: Two times a day (BID) | ORAL | 0 refills | Status: AC
  Filled 2024-05-21: qty 20, 10d supply, fill #0

## 2024-05-21 MED ORDER — PREDNISONE 10 MG PO TABS
ORAL_TABLET | ORAL | 0 refills | Status: AC
  Filled 2024-05-21: qty 21, 6d supply, fill #0

## 2024-05-21 MED ORDER — ALBUTEROL SULFATE HFA 108 (90 BASE) MCG/ACT IN AERS
1.0000 | INHALATION_SPRAY | Freq: Four times a day (QID) | RESPIRATORY_TRACT | 0 refills | Status: DC | PRN
  Filled 2024-05-21: qty 18, 30d supply, fill #0

## 2024-05-21 NOTE — ED Provider Notes (Signed)
 Deborah Jenkins    CSN: [id number] Arrival date & time: 05/21/24  1151      History   Chief Complaint Chief Complaint  Patient presents with   Nasal Congestion   Cough   Sore Throat    HPI Deborah Jenkins is a 68 y.o. female.  Patient presents with 1 week history of congestion, sinus pressure, postnasal drip, runny nose, cough productive of yellow sputum, headache.  Treatment attempted with Mucinex.  No fever or shortness of breath.  The history is provided by the patient and medical records.    Past Medical History:  Diagnosis Date   Acute lower GI bleeding 11/13/2020   Depression 2006   GERD (gastroesophageal reflux disease) 2006   Iron  deficiency anemia 01/04/2021    Patient Active Problem List   Diagnosis Date Noted   Moderate episode of recurrent major depressive disorder (HCC) 10/05/2023   Bilateral hearing loss 04/03/2023   Prediabetes 08/02/2021   Iron  deficiency anemia 01/04/2021   Other dysphagia    Gastric nodule    Atrophic gastritis without hemorrhage    Polyp of colon    Diverticulosis of colon with hemorrhage    Diverticulosis 11/14/2020   Acute blood loss anemia 11/14/2020   Obesity, morbid (HCC) 03/22/2019   Psoriasis 03/22/2019   Anxiety 04/06/2018   Hyperlipidemia 04/06/2018   Tobacco use disorder 01/10/2018   GERD (gastroesophageal reflux disease) 01/10/2018   Allergic rhinitis 08/21/2015   Depression 02/19/2014    Past Surgical History:  Procedure Laterality Date   COLONOSCOPY WITH PROPOFOL  N/A 12/09/2020   Procedure: COLONOSCOPY WITH PROPOFOL ;  Surgeon: Janalyn Keene NOVAK, MD;  Location: [facility] ENDOSCOPY;  Service: Endoscopy;  Laterality: N/A;   ESOPHAGOGASTRODUODENOSCOPY (EGD) WITH PROPOFOL  N/A 12/09/2020   Procedure: ESOPHAGOGASTRODUODENOSCOPY (EGD) WITH PROPOFOL ;  Surgeon: Janalyn Keene NOVAK, MD;  Location: [facility] ENDOSCOPY;  Service: Endoscopy;  Laterality: N/A;   FRACTURE SURGERY     TONSILLECTOMY Bilateral Childhood   TUBAL  LIGATION     WRIST FRACTURE SURGERY Right    in childhood    OB History   No obstetric history on file.      Home Medications    Prior to Admission medications   Medication Sig Start Date End Date Taking? Authorizing Provider  albuterol  (VENTOLIN  HFA) 108 (90 Base) MCG/ACT inhaler Inhale 1-2 puffs into the lungs every 6 (six) hours as needed. 05/21/24  Yes Corlis Burnard DEL, NP  amoxicillin -clavulanate (AUGMENTIN ) 875-125 MG tablet Take 1 tablet by mouth every 12 (twelve) hours for 10 days. 05/21/24 05/31/24 Yes Corlis Burnard DEL, NP  b complex vitamins capsule Take 1 capsule by mouth daily.   Yes [provider]  buPROPion  (WELLBUTRIN  XL) 150 MG 24 hr tablet Take 1 tablet (150 mg total) by mouth daily. 01/02/24  Yes Bacigalupo, Jon HERO, MD  cholecalciferol  (VITAMIN D3) 25 MCG (1000 UNIT) tablet Take 1,000 Units by mouth daily.   Yes [provider]  Ferrous Sulfate (IRON  PO) Take 25 mg by mouth daily.   Yes [provider]  FLUoxetine  (PROZAC ) 20 MG capsule Take 3 capsules (60 mg total) by mouth daily. 04/03/24  Yes Bacigalupo, Angela M, MD  fluticasone  (FLONASE ) 50 MCG/ACT nasal spray Place 1 spray into both nostrils daily as needed for allergies or rhinitis. 03/29/21  Yes Bacigalupo, Angela M, MD  Melatonin 10 MG TABS Take 10 mg by mouth at bedtime.   Yes [provider]  omeprazole  (PRILOSEC) 20 MG capsule Take 1 capsule (20 mg total)  by mouth daily. 04/03/24 04/03/25 Yes Bacigalupo, Jon HERO, MD  predniSONE  (DELTASONE ) 10 MG tablet Take 6 tablets (60 mg total) by mouth daily for 1 day, THEN 5 tablets (50 mg total) daily for 1 day, THEN 4 tablets (40 mg total) daily for 1 day, THEN 3 tablets (30 mg total) daily for 1 day, THEN 2 tablets (20 mg total) daily for 1 day, THEN 1 tablet (10 mg total) daily for 1 day. 05/21/24 05/27/24 Yes Corlis Burnard DEL, NP  rosuvastatin  (CRESTOR ) 10 MG tablet Take 1 tablet (10 mg total) by mouth daily. 04/03/24  Yes Bacigalupo, Angela M, MD   hydrocortisone cream 0.5 % Apply 1 application topically daily as needed for itching (on elbows). Patient not taking: Reported on 05/21/2024    [provider]  naltrexone  (DEPADE) 50 MG tablet Take 0.5 tablets (25 mg total) by mouth daily. Patient not taking: Reported on 05/21/2024 01/02/24   Myrla Jon HERO, MD  UNABLE TO FIND Med Name: Alpha Sculpt- Weight Loss Supplement Patient not taking: Reported on 05/21/2024    [provider]    Family History Family History  Problem Relation Age of Onset   Hypertension Mother    Diverticulosis Mother    Healthy Father    Hypertension Sister    Healthy Brother    Healthy Sister    Alcohol abuse Brother    Alcohol abuse Brother    Cirrhosis Brother    Cirrhosis Brother    Colon cancer Neg Hx    Breast cancer Neg Hx    Ovarian cancer Neg Hx    Cervical cancer Neg Hx     Social History Social History   Tobacco Use   Smoking status: Every Day    Current packs/day: 0.30    Average packs/day: 0.3 packs/day for 20.0 years (6.0 ttl pk-yrs)    Types: Cigarettes   Smokeless tobacco: Never   Tobacco comments:    started smoking at 68 YO; is smoking 1/3 PPD  Vaping Use   Vaping status: Never Used  Substance Use Topics   Alcohol use: No    Alcohol/week: 0.0 standard drinks of alcohol   Drug use: No     Allergies   Atorvastatin    Review of Systems Review of Systems  Constitutional:  Negative for chills and fever.  HENT:  Positive for congestion, postnasal drip, rhinorrhea and sinus pressure. Negative for ear pain and sore throat.   Respiratory:  Positive for cough. Negative for shortness of breath.      Physical Exam Triage Vital Signs ED Triage Vitals [05/21/24 1157]  Encounter Vitals Group     BP      Girls Systolic BP Percentile      Girls Diastolic BP Percentile      Boys Systolic BP Percentile      Boys Diastolic BP Percentile      Pulse      Resp      Temp      Temp src      SpO2       Weight      Height      Head Circumference      Peak Flow      Pain Score 0     Pain Loc      Pain Education      Exclude from Growth Chart    No data found.  Updated Vital Signs BP 130/84   Pulse 78   Temp 98.2 F (36.8 C)  Resp 20   SpO2 97%   Visual Acuity Right Eye Distance:   Left Eye Distance:   Bilateral Distance:    Right Eye Near:   Left Eye Near:    Bilateral Near:     Physical Exam Constitutional:      General: She is not in acute distress. HENT:     Right Ear: Tympanic membrane normal.     Left Ear: Tympanic membrane normal.     Nose: Congestion and rhinorrhea present.     Mouth/Throat:     Mouth: Mucous membranes are moist.     Pharynx: Oropharynx is clear.  Cardiovascular:     Rate and Rhythm: Normal rate and regular rhythm.     Heart sounds: Normal heart sounds.  Pulmonary:     Effort: Pulmonary effort is normal. No respiratory distress.     Breath sounds: Normal breath sounds.  Neurological:     Mental Status: She is alert.      UC Treatments / Results  Labs (all labs ordered are listed, but only abnormal results are displayed) Labs Reviewed - No data to display  EKG   Radiology No results found.  Procedures Procedures (including critical care time)  Medications Ordered in UC Medications - No data to display  Initial Impression / Assessment and Plan / UC Course  I have reviewed the triage vital signs and the nursing notes.  Pertinent labs & imaging results that were available during my care of the patient were reviewed by me and considered in my medical decision making (see chart for details).    Acute sinusitis and bronchitis.  Afebrile and vital signs are stable.  Lungs are clear and O2 sat is 97% on room air.  Patient has been symptomatic for 1 week and is not improving with OTC treatment.  Treating today with albuterol  inhaler, prednisone , Augmentin .  Education provided on sinus infection and bronchitis.  Instructed patient  to follow-up with her PCP if she is not improving.  She agrees to plan of care.  Final Clinical Impressions(s) / UC Diagnoses   Final diagnoses:  Acute non-recurrent maxillary sinusitis  Acute bronchitis, unspecified organism     Discharge Instructions      Take the Augmentin  and prednisone  as directed.  Use the albuterol  inhaler as directed.  Follow-up with your primary care provider if your symptoms are not improving.      ED Prescriptions     Medication Sig Dispense Auth. Provider   albuterol  (VENTOLIN  HFA) 108 (90 Base) MCG/ACT inhaler Inhale 1-2 puffs into the lungs every 6 (six) hours as needed. 18 g Corlis Burnard DEL, NP   predniSONE  (DELTASONE ) 10 MG tablet Take 6 tablets (60 mg total) by mouth daily for 1 day, THEN 5 tablets (50 mg total) daily for 1 day, THEN 4 tablets (40 mg total) daily for 1 day, THEN 3 tablets (30 mg total) daily for 1 day, THEN 2 tablets (20 mg total) daily for 1 day, THEN 1 tablet (10 mg total) daily for 1 day. 21 tablet Corlis Burnard DEL, NP   amoxicillin -clavulanate (AUGMENTIN ) 875-125 MG tablet Take 1 tablet by mouth every 12 (twelve) hours for 10 days. 20 tablet Corlis Burnard DEL, NP      PDMP not reviewed this encounter.   Corlis Burnard DEL, NP 05/21/24 [phone]

## 2024-05-21 NOTE — ED Triage Notes (Signed)
 Patient to Urgent Care with complaints of nasal congestion/ sinus pain and pressure/ headaches/ intermittently productive cough (yellow). Fevers last week.  Symptoms x1 week.   Meds: multiple types of mucinex w/o improvement.

## 2024-05-21 NOTE — Discharge Instructions (Addendum)
Take the Augmentin and prednisone as directed.  Use the albuterol inhaler as directed.  Follow up with your primary care provider if your symptoms are not improving.

## 2024-06-06 ENCOUNTER — Encounter: Payer: Self-pay | Admitting: Family Medicine

## 2024-06-06 ENCOUNTER — Ambulatory Visit (INDEPENDENT_AMBULATORY_CARE_PROVIDER_SITE_OTHER): Admitting: Family Medicine

## 2024-06-06 VITALS — BP 138/96 | HR 84 | Wt 227.9 lb

## 2024-06-06 DIAGNOSIS — R531 Weakness: Secondary | ICD-10-CM

## 2024-06-06 DIAGNOSIS — Z8719 Personal history of other diseases of the digestive system: Secondary | ICD-10-CM | POA: Diagnosis not present

## 2024-06-06 DIAGNOSIS — K625 Hemorrhage of anus and rectum: Secondary | ICD-10-CM

## 2024-06-06 NOTE — Progress Notes (Signed)
 Acute Office Visit  Introduced to nurse practitioner role and practice setting.  All questions answered.  Discussed provider/patient relationship and expectations.   Subjective:     Patient ID: Deborah Jenkins, female    DOB: 07-Sep-1955, 68 y.o.   MRN: [id number]  Chief Complaint  Patient presents with   Rectal Bleeding    Frequency: this morning    Discussed the use of AI scribe software for clinical note transcription with the patient, who gave verbal consent to proceed.  History of Present Illness Deborah Jenkins is a 68 year old female with history of diverticulosis who presents with rectal bleeding.  She noticed a tinge of blood when wiping after urination last night, initially unsure if it was vaginal or rectal. Further wiping confirmed it was from her rectum. This morning, she experienced a significant amount of bright red blood in the toilet bowl, separate from her stool.  She recalls a similar episode in 2020, which required hospitalization and blood transfusions due to acute blood loss from a lower GI bleed. She was told it was not diverticulitis but from the diverticula, but the exact cause was not explained to her. She was treated with iron  and blood transfusions at that time.  Currently, she has no abdominal pain, bloating, or changes in stool consistency. Her stool is described as normal and not hard, with no need to strain during bowel movements. She has not experienced any lightheadedness but reports feeling weak, which she attributes to a recent cold.  Her medication regimen includes over-the-counter iron  supplements, which she takes regularly. She denies the use of blood thinners, aspirin, or frequent NSAID use.  She is postmenopausal and confirms that the bleeding is not vaginal. She has not seen a GI specialist since her last hospitalization but is seeking evaluation due to the recurrence of symptoms.   Rectal Bleeding     Review of Systems  Gastrointestinal:   Positive for hematochezia.        Objective:    BP (!) 138/96 (BP Location: Right Arm, Patient Position: Sitting, Cuff Size: Normal)   Pulse 84   Wt 227 lb 14.4 oz (103.4 kg)   SpO2 100%   BMI 36.78 kg/m    Physical Exam Constitutional:      General: She is not in acute distress.    Appearance: Normal appearance. She is not ill-appearing, toxic-appearing or diaphoretic.  HENT:     Head: Normocephalic.     Nose: Nose normal.     Mouth/Throat:     Mouth: Mucous membranes are moist.     Pharynx: Oropharynx is clear.  Eyes:     Extraocular Movements: Extraocular movements intact.     Pupils: Pupils are equal, round, and reactive to light.  Cardiovascular:     Rate and Rhythm: Normal rate and regular rhythm.     Pulses: Normal pulses.     Heart sounds: Normal heart sounds. No murmur heard.    No friction rub. No gallop.  Pulmonary:     Effort: No respiratory distress.     Breath sounds: No stridor. No wheezing, rhonchi or rales.  Chest:     Chest wall: No tenderness.  Abdominal:     General: Bowel sounds are normal. There is no distension.     Palpations: Abdomen is soft. There is no mass.     Tenderness: There is no abdominal tenderness. There is no right CVA tenderness, left CVA tenderness, guarding or rebound.  Hernia: No hernia is present.  Genitourinary:    Rectum: Normal. No mass, tenderness, anal fissure or external hemorrhoid. Normal anal tone.  Musculoskeletal:     Right lower leg: No edema.     Left lower leg: No edema.  Skin:    General: Skin is warm and dry.     Capillary Refill: Capillary refill takes less than 2 seconds.  Neurological:     General: No focal deficit present.     Mental Status: She is alert and oriented to person, place, and time. Mental status is at baseline.  Psychiatric:        Mood and Affect: Mood normal.        Behavior: Behavior normal.        Thought Content: Thought content normal.        Judgment: Judgment normal.      No results found for any visits on 06/06/24.      Assessment & Plan:  Assessment and Plan Assessment & Plan Acute rectal bleeding Bright red blood in the toilet bowl, not mixed with stool. No abdominal pain, hematochezia, or melena. No obvious external hemorrhoids on examination or rectal abnormalities being the cause of bleed. Differential includes diverticular hemorrhage given history of diverticulosis and prior lower GI bleed. No current use of anticoagulants or NSAIDs, though ibuprofen  is used occasionally. - Ordered CBC to assess for anemia - Ordered iron  studies and B12 levels - Referred to GI specialist for further evaluation - Advised to seek emergent care if symptoms worsen, such as increased bleeding or lightheadedness - Encouraged adequate hydration and monitoring of blood pressure at home  History of diverticulosis with prior lower GI bleed Diverticulosis with previous lower GI bleed requiring hospitalization and blood transfusion. Current episode of rectal bleeding raises concern for recurrence of diverticular hemorrhage. - Referred to GI specialist for further evaluation and management  Weakness, unspecified Reports feeling weak, not extreme, pt feels related to recent URI or anemia from current rectal bleeding. No lightheadedness or syncope reported. - Ordered CBC, iron , CMP, to assess for anemia - Advised to monitor for symptoms of anemia and seek care if symptoms worsen  Problem List Items Addressed This Visit   None Visit Diagnoses       Rectal bleeding    -  Primary   Relevant Orders   CBC with Differential/Platelet   Iron , TIBC and Ferritin Panel   Comprehensive metabolic panel with GFR   APTT       No orders of the defined types were placed in this encounter.   Return if symptoms worsen or fail to improve.  Curtis DELENA Boom, FNP  I, Curtis DELENA Boom, FNP, have reviewed all documentation for this visit. The documentation on 06/06/24 for the  exam, diagnosis, procedures, and orders are all accurate and complete.

## 2024-06-07 ENCOUNTER — Ambulatory Visit: Payer: Self-pay | Admitting: Family Medicine

## 2024-06-07 LAB — COMPREHENSIVE METABOLIC PANEL WITH GFR
ALT: 28 [IU]/L (ref 0–32)
AST: 21 [IU]/L (ref 0–40)
Albumin: 4.6 g/dL (ref 3.9–4.9)
Alkaline Phosphatase: 100 [IU]/L (ref 49–135)
BUN/Creatinine Ratio: 11 — ABNORMAL LOW (ref 12–28)
BUN: 10 mg/dL (ref 8–27)
Bilirubin Total: 0.3 mg/dL (ref 0.0–1.2)
CO2: 21 mmol/L (ref 20–29)
Calcium: 9.7 mg/dL (ref 8.7–10.3)
Chloride: 103 mmol/L (ref 96–106)
Creatinine, Ser: 0.91 mg/dL (ref 0.57–1.00)
Globulin, Total: 2.3 g/dL (ref 1.5–4.5)
Glucose: 106 mg/dL — ABNORMAL HIGH (ref 70–99)
Potassium: 4.8 mmol/L (ref 3.5–5.2)
Sodium: 140 mmol/L (ref 134–144)
Total Protein: 6.9 g/dL (ref 6.0–8.5)
eGFR: 70 mL/min/{1.73_m2}

## 2024-06-07 LAB — CBC WITH DIFFERENTIAL/PLATELET
Basophils Absolute: 0 10*3/uL (ref 0.0–0.2)
Basos: 1 %
EOS (ABSOLUTE): 0.1 10*3/uL (ref 0.0–0.4)
Eos: 1 %
Hematocrit: 43 % (ref 34.0–46.6)
Hemoglobin: 13.7 g/dL (ref 11.1–15.9)
Immature Grans (Abs): 0 10*3/uL (ref 0.0–0.1)
Immature Granulocytes: 0 %
Lymphocytes Absolute: 1.6 10*3/uL (ref 0.7–3.1)
Lymphs: 27 %
MCH: 28.8 pg (ref 26.6–33.0)
MCHC: 31.9 g/dL (ref 31.5–35.7)
MCV: 90 fL (ref 79–97)
Monocytes Absolute: 0.5 10*3/uL (ref 0.1–0.9)
Monocytes: 8 %
Neutrophils Absolute: 3.7 10*3/uL (ref 1.4–7.0)
Neutrophils: 63 %
Platelets: 368 10*3/uL (ref 150–450)
RBC: 4.76 x10E6/uL (ref 3.77–5.28)
RDW: 13.5 % (ref 11.7–15.4)
WBC: 5.8 10*3/uL (ref 3.4–10.8)

## 2024-06-07 LAB — APTT: aPTT: 28 s (ref 24–33)

## 2024-06-07 LAB — IRON,TIBC AND FERRITIN PANEL
Ferritin: 37 ng/mL (ref 15–150)
Iron Saturation: 22 % (ref 15–55)
Iron: 75 ug/dL (ref 27–139)
Total Iron Binding Capacity: 335 ug/dL (ref 250–450)
UIBC: 260 ug/dL (ref 118–369)

## 2024-06-20 ENCOUNTER — Other Ambulatory Visit: Payer: Self-pay

## 2024-06-20 MED ORDER — NA SULFATE-K SULFATE-MG SULF 17.5-3.13-1.6 GM/177ML PO SOLN
ORAL | 0 refills | Status: DC
  Filled 2024-06-20 – 2024-07-04 (×3): qty 354, 1d supply, fill #0

## 2024-06-30 ENCOUNTER — Other Ambulatory Visit: Payer: Self-pay

## 2024-07-04 ENCOUNTER — Encounter: Payer: Self-pay | Admitting: Family Medicine

## 2024-07-04 ENCOUNTER — Ambulatory Visit (INDEPENDENT_AMBULATORY_CARE_PROVIDER_SITE_OTHER): Admitting: Family Medicine

## 2024-07-04 ENCOUNTER — Other Ambulatory Visit: Payer: Self-pay

## 2024-07-04 VITALS — BP 132/80 | HR 88 | Ht 66.0 in | Wt 228.6 lb

## 2024-07-04 DIAGNOSIS — E7800 Pure hypercholesterolemia, unspecified: Secondary | ICD-10-CM | POA: Diagnosis not present

## 2024-07-04 DIAGNOSIS — R7303 Prediabetes: Secondary | ICD-10-CM

## 2024-07-04 DIAGNOSIS — F17200 Nicotine dependence, unspecified, uncomplicated: Secondary | ICD-10-CM

## 2024-07-04 DIAGNOSIS — F331 Major depressive disorder, recurrent, moderate: Secondary | ICD-10-CM

## 2024-07-04 DIAGNOSIS — F419 Anxiety disorder, unspecified: Secondary | ICD-10-CM | POA: Diagnosis not present

## 2024-07-04 MED ORDER — FLUOXETINE HCL 20 MG PO CAPS
40.0000 mg | ORAL_CAPSULE | Freq: Every day | ORAL | 3 refills | Status: AC
  Filled 2024-07-04 (×2): qty 180, 90d supply, fill #0

## 2024-07-04 NOTE — Assessment & Plan Note (Signed)
 BMI 36 and assoc with HLD, prediabetes, MDD Discussed importance of healthy weight management Discussed diet and exercise

## 2024-07-04 NOTE — Assessment & Plan Note (Signed)
 Currently on Prozac , reduced dose from 60 mg to 40 mg daily due to insurance and pharmacy preferences. Reports feeling fine on the reduced dose. - Update prescription to Prozac  40 mg daily

## 2024-07-04 NOTE — Progress Notes (Signed)
 Established patient visit   Patient: Deborah Jenkins   DOB: [birth year]   68 y.o. Female  MRN: [id number] Visit Date: 07/04/2024  Today's healthcare provider: Jon Eva, MD   Chief Complaint  Patient presents with   Medical Management of Chronic Issues   Hyperlipidemia    She reports no symptoms and tolerating medication well   Anxiety    She reports fair compliance with treatment. She reports excellent tolerance of treatment. She is not having side effects. She feels her anxiety is mild and Improved since last visit. Symptoms: none. She reports she is now down to 2 capsules.     Subjective    Hyperlipidemia  Anxiety     HPI     Hyperlipidemia    Additional comments: She reports no symptoms and tolerating medication well        Anxiety    Additional comments: She reports fair compliance with treatment. She reports excellent tolerance of treatment. She is not having side effects. She feels her anxiety is mild and Improved since last visit. Symptoms: none. She reports she is now down to 2 capsules.        Last edited by Lilian Fitzpatrick, CMA on 07/04/2024  3:01 PM.       Discussed the use of AI scribe software for clinical note transcription with the patient, who gave verbal consent to proceed.  History of Present Illness   Deborah Jenkins is a 67 year old female who presents for a follow-up visit to review her current medications and recent lab results.  She takes Prozac  60 mg daily for anxiety and Crestor  10 mg daily for cholesterol. She recently reduced her Prozac  dose to 40 mg daily due to insurance issues, with no change in symptoms. She has recurrent bleeding episodes and is scheduled for a colonoscopy, which causes apprehension. Recent labs show normal anemia and iron  levels. Cholesterol and A1c levels have not been checked in over a year, with plans to test after the colonoscopy. She smokes only in her car and finds quitting difficult. She  exercises regularly and recently received a flu shot but is hesitant about shingles and pneumonia vaccines.         06/06/2024    9:21 AM 04/02/2024    9:08 AM 01/02/2024    9:55 AM 10/05/2023   10:31 AM 07/04/2023    9:58 AM  Depression screen PHQ 2/9  Decreased Interest 0 0 0 0 0  Down, Depressed, Hopeless 0 0 0 0 0  PHQ - 2 Score 0 0 0 0 0  Altered sleeping  0   0  Tired, decreased energy  0   0  Change in appetite  0   0  Feeling bad or failure about yourself   0   0  Trouble concentrating  0   0  Moving slowly or fidgety/restless  0   0  Suicidal thoughts  0   0  PHQ-9 Score  0   0  Difficult doing work/chores  Not difficult at all   Not difficult at all       06/06/2024    9:21 AM 04/02/2024    9:08 AM 01/02/2024    9:55 AM 10/05/2023   10:31 AM  GAD 7 : Generalized Anxiety Score  Nervous, Anxious, on Edge 0 0 0 0  Control/stop worrying 0 0 0 0  Worry too much - different things 0 0 0 0  Trouble relaxing 0  0 0 0  Restless 0 0 0 0  Easily annoyed or irritable 0 0 0 0  Afraid - awful might happen 0 0 0 0  Total GAD 7 Score 0 0 0 0  Anxiety Difficulty Not difficult at all Not difficult at all Not difficult at all Not difficult at all      Medications: Outpatient Medications Prior to Visit  Medication Sig   albuterol  (VENTOLIN  HFA) 108 (90 Base) MCG/ACT inhaler Inhale 1-2 puffs into the lungs every 6 (six) hours as needed.   b complex vitamins capsule Take 1 capsule by mouth daily.   cholecalciferol  (VITAMIN D3) 25 MCG (1000 UNIT) tablet Take 1,000 Units by mouth daily.   Ferrous Sulfate (IRON  PO) Take 25 mg by mouth daily.   fluticasone  (FLONASE ) 50 MCG/ACT nasal spray Place 1 spray into both nostrils daily as needed for allergies or rhinitis.   hydrocortisone cream 0.5 % Apply 1 application topically daily as needed for itching (on elbows).   Melatonin 10 MG TABS Take 10 mg by mouth at bedtime.   omeprazole  (PRILOSEC) 20 MG capsule Take 1 capsule (20 mg total) by  mouth daily.   rosuvastatin  (CRESTOR ) 10 MG tablet Take 1 tablet (10 mg total) by mouth daily.   [DISCONTINUED] FLUoxetine  (PROZAC ) 20 MG capsule Take 3 capsules (60 mg total) by mouth daily.   Na Sulfate-K Sulfate-Mg Sulfate concentrate (SUPREP) 17.5-3.13-1.6 GM/177ML SOLN Take as directed for colon prep. (Patient not taking: Reported on 07/04/2024)   [DISCONTINUED] buPROPion  (WELLBUTRIN  XL) 150 MG 24 hr tablet Take 1 tablet (150 mg total) by mouth daily.   No facility-administered medications prior to visit.    Review of Systems     Objective    BP 132/80 (BP Location: Left Arm, Patient Position: Sitting, Cuff Size: Large)   Pulse 88   Ht 5' 6 (1.676 m)   Wt 228 lb 9.6 oz (103.7 kg)   SpO2 98%   BMI 36.90 kg/m    Physical Exam Vitals reviewed.  Constitutional:      General: She is not in acute distress.    Appearance: Normal appearance. She is well-developed. She is not diaphoretic.  HENT:     Head: Normocephalic and atraumatic.  Eyes:     General: No scleral icterus.    Conjunctiva/sclera: Conjunctivae normal.  Neck:     Thyroid : No thyromegaly.  Cardiovascular:     Rate and Rhythm: Normal rate and regular rhythm.     Heart sounds: Normal heart sounds. No murmur heard. Pulmonary:     Effort: Pulmonary effort is normal. No respiratory distress.     Breath sounds: Normal breath sounds. No wheezing, rhonchi or rales.  Musculoskeletal:     Cervical back: Neck supple.     Right lower leg: No edema.     Left lower leg: No edema.  Lymphadenopathy:     Cervical: No cervical adenopathy.  Skin:    General: Skin is warm and dry.     Findings: No rash.  Neurological:     Mental Status: She is alert and oriented to person, place, and time. Mental status is at baseline.  Psychiatric:        Mood and Affect: Mood normal.        Behavior: Behavior normal.      No results found for any visits on 07/04/24.  Assessment & Plan     Problem List Items Addressed This Visit        Other  Tobacco use disorder   Continues to smoke, primarily in the car. Acknowledges the habit and its health risks but is not currently ready to quit. - Offer resources for smoking cessation when ready       Anxiety - Primary   Currently on Prozac , reduced dose from 60 mg to 40 mg daily due to insurance and pharmacy preferences. Reports feeling fine on the reduced dose. - Update prescription to Prozac  40 mg daily      Relevant Medications   FLUoxetine  (PROZAC ) 20 MG capsule   Hyperlipidemia   Cholesterol levels have not been checked in over a year. Current medication is Crestor  10 mg daily, which she tolerates well. - Provide lab slip for cholesterol testing after colonoscopy      Relevant Orders   Lipid panel   Obesity, morbid (HCC)   BMI 36 and assoc with HLD, prediabetes, MDD Discussed importance of healthy weight management Discussed diet and exercise       Prediabetes   A1c has not been checked since January. Monitoring is necessary to assess current status. - Provide lab slip for A1c testing after colonoscopy      Relevant Orders   Hemoglobin A1c   Moderate episode of recurrent major depressive disorder (HCC)   Currently on Prozac , reduced dose from 60 mg to 40 mg daily due to insurance and pharmacy preferences. Reports feeling fine on the reduced dose. - Update prescription to Prozac  40 mg daily      Relevant Medications   FLUoxetine  (PROZAC ) 20 MG capsule    Return in about 9 months (around 04/03/2025) for CPE and , AWV, with NHA.       Jon Eva, MD  [facility] [phone] (phone) [phone] (fax)  [facility]

## 2024-07-04 NOTE — Assessment & Plan Note (Signed)
 Cholesterol levels have not been checked in over a year. Current medication is Crestor  10 mg daily, which she tolerates well. - Provide lab slip for cholesterol testing after colonoscopy

## 2024-07-04 NOTE — Assessment & Plan Note (Signed)
 A1c has not been checked since January. Monitoring is necessary to assess current status. - Provide lab slip for A1c testing after colonoscopy

## 2024-07-04 NOTE — Assessment & Plan Note (Signed)
 Continues to smoke, primarily in the car. Acknowledges the habit and its health risks but is not currently ready to quit. - Offer resources for smoking cessation when ready

## 2024-07-05 ENCOUNTER — Other Ambulatory Visit (HOSPITAL_COMMUNITY): Payer: Self-pay

## 2024-07-05 LAB — LIPID PANEL
Chol/HDL Ratio: 2.4 ratio (ref 0.0–4.4)
Cholesterol, Total: 175 mg/dL (ref 100–199)
HDL: 72 mg/dL (ref >39)
LDL Chol Calc (NIH): 85 mg/dL (ref 0–99)
Triglycerides: 100 mg/dL (ref 0–149)
VLDL Cholesterol Cal: 18 mg/dL (ref 5–40)

## 2024-07-05 LAB — HEMOGLOBIN A1C
Est. average glucose Bld gHb Est-mCnc: 131 mg/dL
Hgb A1c MFr Bld: 6.2 % — ABNORMAL HIGH (ref 4.8–5.6)

## 2024-07-08 ENCOUNTER — Ambulatory Visit: Payer: Self-pay | Admitting: Family Medicine

## 2024-07-10 ENCOUNTER — Other Ambulatory Visit: Payer: Self-pay

## 2024-07-10 ENCOUNTER — Ambulatory Visit
Admission: RE | Admit: 2024-07-10 | Discharge: 2024-07-10 | Disposition: A | Discharge status: Home / Self Care | Attending: Gastroenterology | Admitting: Gastroenterology

## 2024-07-10 ENCOUNTER — Encounter
Admission: RE | Disposition: A | Discharge status: Home / Self Care | Payer: Self-pay | Source: Home / Self Care | Attending: Gastroenterology

## 2024-07-10 ENCOUNTER — Ambulatory Visit: Admitting: Anesthesiology

## 2024-07-10 ENCOUNTER — Encounter: Payer: Self-pay | Admitting: Gastroenterology

## 2024-07-10 DIAGNOSIS — E66813 Obesity, class 3: Secondary | ICD-10-CM | POA: Diagnosis not present

## 2024-07-10 DIAGNOSIS — Z8673 Personal history of transient ischemic attack (TIA), and cerebral infarction without residual deficits: Secondary | ICD-10-CM | POA: Insufficient documentation

## 2024-07-10 DIAGNOSIS — Z79899 Other long term (current) drug therapy: Secondary | ICD-10-CM | POA: Diagnosis not present

## 2024-07-10 DIAGNOSIS — F17210 Nicotine dependence, cigarettes, uncomplicated: Secondary | ICD-10-CM | POA: Diagnosis not present

## 2024-07-10 DIAGNOSIS — Z6836 Body mass index (BMI) 36.0-36.9, adult: Secondary | ICD-10-CM | POA: Diagnosis not present

## 2024-07-10 DIAGNOSIS — Z83719 Family history of colon polyps, unspecified: Secondary | ICD-10-CM | POA: Diagnosis not present

## 2024-07-10 DIAGNOSIS — K5730 Diverticulosis of large intestine without perforation or abscess without bleeding: Secondary | ICD-10-CM | POA: Diagnosis not present

## 2024-07-10 DIAGNOSIS — F419 Anxiety disorder, unspecified: Secondary | ICD-10-CM | POA: Insufficient documentation

## 2024-07-10 DIAGNOSIS — K219 Gastro-esophageal reflux disease without esophagitis: Secondary | ICD-10-CM | POA: Insufficient documentation

## 2024-07-10 DIAGNOSIS — J449 Chronic obstructive pulmonary disease, unspecified: Secondary | ICD-10-CM | POA: Diagnosis not present

## 2024-07-10 DIAGNOSIS — K625 Hemorrhage of anus and rectum: Secondary | ICD-10-CM | POA: Diagnosis present

## 2024-07-10 HISTORY — PX: COLONOSCOPY: SHX5424

## 2024-07-10 SURGERY — COLONOSCOPY
Anesthesia: General

## 2024-07-10 MED ORDER — PROPOFOL 500 MG/50ML IV EMUL
INTRAVENOUS | Status: DC | PRN
Start: 2024-07-10 — End: 2024-07-10
  Administered 2024-07-10: 75 ug/kg/min via INTRAVENOUS

## 2024-07-10 MED ORDER — LIDOCAINE HCL (PF) 2 % IJ SOLN
INTRAMUSCULAR | Status: AC
  Filled 2024-07-10: qty 5

## 2024-07-10 MED ORDER — PROPOFOL 1000 MG/100ML IV EMUL
INTRAVENOUS | Status: AC
  Filled 2024-07-10: qty 100

## 2024-07-10 MED ORDER — SODIUM CHLORIDE 0.9 % IV SOLN: INTRAVENOUS | Status: DC

## 2024-07-10 MED ORDER — DEXMEDETOMIDINE HCL IN NACL 80 MCG/20ML IV SOLN
INTRAVENOUS | Status: DC | PRN
Start: 2024-07-10 — End: 2024-07-10
  Administered 2024-07-10: 8 ug via INTRAVENOUS
  Administered 2024-07-10: 12 ug via INTRAVENOUS

## 2024-07-10 MED ORDER — LIDOCAINE HCL (CARDIAC) PF 100 MG/5ML IV SOSY
PREFILLED_SYRINGE | INTRAVENOUS | Status: DC | PRN
  Administered 2024-07-10: 80 mg via INTRAVENOUS

## 2024-07-10 MED ORDER — PROPOFOL 10 MG/ML IV BOLUS
INTRAVENOUS | Status: DC | PRN
  Administered 2024-07-10: 50 mg via INTRAVENOUS
  Administered 2024-07-10: 20 mg via INTRAVENOUS
  Administered 2024-07-10: 30 mg via INTRAVENOUS

## 2024-07-10 MED ORDER — DEXMEDETOMIDINE HCL IN NACL 80 MCG/20ML IV SOLN
INTRAVENOUS | Status: AC
  Filled 2024-07-10: qty 20

## 2024-07-10 NOTE — H&P (Signed)
 Deborah JONELLE Brooklyn, MD [facility], DHIP [address]  [place], [place] [address]  Main: [phone] Fax:  [phone] Pager: [phone]   Primary Care Physician:  Myrla Jon HERO, MD Primary Gastroenterologist:  Dr. Corinn JONELLE Jenkins  Pre-Procedure History & Physical: HPI:  Deborah Jenkins is a 68 y.o. female is here for an colonoscopy.   Past Medical History:  Diagnosis Date   Acute lower GI bleeding 11/13/2020   Depression 2006   GERD (gastroesophageal reflux disease) 2006   Iron  deficiency anemia 01/04/2021    Past Surgical History:  Procedure Laterality Date   COLONOSCOPY WITH PROPOFOL  N/A 12/09/2020   Procedure: COLONOSCOPY WITH PROPOFOL ;  Surgeon: Janalyn Keene NOVAK, MD;  Location: [facility] ENDOSCOPY;  Service: Endoscopy;  Laterality: N/A;   ESOPHAGOGASTRODUODENOSCOPY (EGD) WITH PROPOFOL  N/A 12/09/2020   Procedure: ESOPHAGOGASTRODUODENOSCOPY (EGD) WITH PROPOFOL ;  Surgeon: Janalyn Keene NOVAK, MD;  Location: [facility] ENDOSCOPY;  Service: Endoscopy;  Laterality: N/A;   FRACTURE SURGERY     TONSILLECTOMY Bilateral Childhood   TUBAL LIGATION     WRIST FRACTURE SURGERY Right    in childhood    Prior to Admission medications   Medication Sig Start Date End Date Taking? Authorizing Provider  b complex vitamins capsule Take 1 capsule by mouth daily.   Yes [provider]  cholecalciferol  (VITAMIN D3) 25 MCG (1000 UNIT) tablet Take 1,000 Units by mouth daily.   Yes [provider]  Ferrous Sulfate (IRON  PO) Take 25 mg by mouth daily.   Yes [provider]  FLUoxetine  (PROZAC ) 20 MG capsule Take 2 capsules (40 mg total) by mouth daily. 07/04/24  Yes Bacigalupo, Jon HERO, MD  fluticasone  (FLONASE ) 50 MCG/ACT nasal spray Place 1 spray into both nostrils daily as needed for allergies or rhinitis. 03/29/21  Yes Bacigalupo, Angela M, MD  Melatonin 10 MG TABS Take 10 mg by mouth at bedtime.   Yes [provider]  omeprazole   (PRILOSEC) 20 MG capsule Take 1 capsule (20 mg total) by mouth daily. 04/03/24 04/03/25 Yes Bacigalupo, Jon HERO, MD  rosuvastatin  (CRESTOR ) 10 MG tablet Take 1 tablet (10 mg total) by mouth daily. 04/03/24  Yes Bacigalupo, Angela M, MD  albuterol  (VENTOLIN  HFA) 108 (90 Base) MCG/ACT inhaler Inhale 1-2 puffs into the lungs every 6 (six) hours as needed. 05/21/24   Corlis Burnard DEL, NP  hydrocortisone cream 0.5 % Apply 1 application topically daily as needed for itching (on elbows).    [provider]  Na Sulfate-K Sulfate-Mg Sulfate concentrate (SUPREP) 17.5-3.13-1.6 GM/177ML SOLN Take as directed for colon prep. Patient not taking: Reported on 07/04/2024 06/20/24       Allergies as of 07/01/2024 - Review Complete 06/06/2024  Allergen Reaction Noted   Atorvastatin   04/06/2018    Family History  Problem Relation Age of Onset   Hypertension Mother    Diverticulosis Mother    Healthy Father    Hypertension Sister    Healthy Brother    Healthy Sister    Alcohol abuse Brother    Alcohol abuse Brother    Cirrhosis Brother    Cirrhosis Brother    Colon cancer Neg Hx    Breast cancer Neg Hx    Ovarian cancer Neg Hx    Cervical cancer Neg Hx     Social History   Socioeconomic History   Marital status: Single    Spouse name: Not on file   Number of children: 2   Years of education: 51  Highest education level: Associate degree: academic program  Occupational History   Occupation: Manufacturing Engineer: [employer]    Comment: [employer]  Tobacco Use   Smoking status: Every Day    Current packs/day: 0.30    Average packs/day: 0.3 packs/day for 20.0 years (6.0 ttl pk-yrs)    Types: Cigarettes   Smokeless tobacco: Never   Tobacco comments:    started smoking at 68 YO; is smoking 1/3 PPD  Vaping Use   Vaping status: Never Used  Substance and Sexual Activity   Alcohol use: No    Alcohol/week: 0.0 standard drinks of alcohol   Drug use: No   Sexual activity: Yes    Birth  control/protection: Post-menopausal  Other Topics Concern   Not on file  Social History Narrative      Social Drivers of Health   Financial Resource Strain: Patient Declined (07/04/2024)   Overall Financial Resource Strain (CARDIA)    Difficulty of Paying Living Expenses: Patient declined  Food Insecurity: Patient Declined (07/04/2024)   Hunger Vital Sign    Worried About Running Out of Food in the Last Year: Patient declined    Ran Out of Food in the Last Year: Patient declined  Transportation Needs: Patient Declined (07/04/2024)   PRAPARE - Administrator, Civil Service (Medical): Patient declined    Lack of Transportation (Non-Medical): Patient declined  Physical Activity: Insufficiently Active (07/04/2024)   Exercise Vital Sign    Days of Exercise per Week: 3 days    Minutes of Exercise per Session: 30 min  Stress: No Stress Concern Present (07/04/2024)   [employer] of Occupational Health - Occupational Stress Questionnaire    Feeling of Stress: Only a little  Social Connections: Unknown (07/04/2024)   Social Connection and Isolation Panel    Frequency of Communication with Friends and Family: Twice a week    Frequency of Social Gatherings with Friends and Family: Patient declined    Attends Religious Services: More than 4 times per year    Active Member of [employer] or Organizations: No    Attends Banker Meetings: Not on file    Marital Status: Widowed  Intimate Partner Violence: Not At Risk (04/02/2024)   Humiliation, Afraid, Rape, and Kick questionnaire    Fear of Current or Ex-Partner: No    Emotionally Abused: No    Physically Abused: No    Sexually Abused: No    Review of Systems: See HPI, otherwise negative ROS  Physical Exam: BP (!) 144/100   Pulse 72   Temp 97.7 F (36.5 C) (Temporal)   Resp 18   Ht 5' 6 (1.676 m)   Wt 102.1 kg   SpO2 99%   BMI 36.32 kg/m  General:   Alert,  pleasant and cooperative in NAD Head:   Normocephalic and atraumatic. Neck:  Supple; no masses or thyromegaly. Lungs:  Clear throughout to auscultation.    Heart:  Regular rate and rhythm. Abdomen:  Soft, nontender and nondistended. Normal bowel sounds, without guarding, and without rebound.   Neurologic:  Alert and  oriented x4;  grossly normal neurologically.  Impression/Plan: Deborah Jenkins is here for an colonoscopy to be performed for rectal bleeding  Risks, benefits, limitations, and alternatives regarding  colonoscopy have been reviewed with the patient.  Questions have been answered.  All parties agreeable.   Deborah Brooklyn, MD  07/10/2024, 8:42 AM

## 2024-07-10 NOTE — Anesthesia Postprocedure Evaluation (Signed)
 Anesthesia Post Note  Patient: Deborah Jenkins  Procedure(s) Performed: COLONOSCOPY  Patient location during evaluation: PACU Anesthesia Type: General Level of consciousness: awake and awake and alert Pain management: satisfactory to patient Vital Signs Assessment: post-procedure vital signs reviewed and stable Respiratory status: spontaneous breathing Cardiovascular status: blood pressure returned to baseline Anesthetic complications: no   No notable events documented.   Last Vitals:  Vitals:   07/10/24 0918 07/10/24 0928  BP: 93/63 107/70  Pulse: 76 74  Resp: 15 19  Temp:    SpO2: 97% 99%    Last Pain:  Vitals:   07/10/24 0928  TempSrc:   PainSc: 0-No pain                 VAN STAVEREN,Derreck Wiltsey

## 2024-07-10 NOTE — Transfer of Care (Signed)
 Immediate Anesthesia Transfer of Care Note  Patient: Deborah Jenkins  Procedure(s) Performed: COLONOSCOPY  Patient Location: PACU  Anesthesia Type:General  Level of Consciousness: sedated  Airway & Oxygen Therapy: Patient Spontanous Breathing  Post-op Assessment: Report given to RN and Post -op Vital signs reviewed and stable  Post vital signs: Reviewed and stable  Last Vitals:  Vitals Value Taken Time  BP    Temp 36 C 07/10/24 09:08  Pulse    Resp 20 07/10/24 09:09  SpO2    Vitals shown include unfiled device data.  Last Pain:  Vitals:   07/10/24 0908  TempSrc: Temporal  PainSc: 0-No pain         Complications: No notable events documented.

## 2024-07-10 NOTE — Anesthesia Preprocedure Evaluation (Signed)
 Anesthesia Evaluation  Patient identified by MRN, date of birth, ID band Patient awake    Reviewed: Allergy & Precautions, NPO status , Patient's Chart, lab work & pertinent test results  Airway Mallampati: II  TM Distance: >3 FB Neck ROM: full Positive for:  Tracheal deviation   Dental  (+) Teeth Intact   Pulmonary neg pulmonary ROS, COPD,  COPD inhaler, Current Smoker and Patient abstained from smoking.   Pulmonary exam normal  + decreased breath sounds      Cardiovascular Exercise Tolerance: Good negative cardio ROS Normal cardiovascular exam Rhythm:Regular Rate:Normal     Neuro/Psych   Anxiety     CVA, No Residual Symptoms negative neurological ROS  negative psych ROS   GI/Hepatic negative GI ROS, Neg liver ROS,GERD  Medicated,,  Endo/Other  negative endocrine ROS  Class 3 obesity  Renal/GU negative Renal ROS  negative genitourinary   Musculoskeletal   Abdominal  (+) + obese  Peds negative pediatric ROS (+)  Hematology negative hematology ROS (+)   Anesthesia Other Findings Past Medical History: 11/13/2020: Acute lower GI bleeding 2006: Depression 2006: GERD (gastroesophageal reflux disease) 01/04/2021: Iron  deficiency anemia  Past Surgical History: 12/09/2020: COLONOSCOPY WITH PROPOFOL ; N/A     Comment:  Procedure: COLONOSCOPY WITH PROPOFOL ;  Surgeon:               Janalyn Keene NOVAK, MD;  Location: [facility] ENDOSCOPY;                Service: Endoscopy;  Laterality: N/A; 12/09/2020: ESOPHAGOGASTRODUODENOSCOPY (EGD) WITH PROPOFOL ; N/A     Comment:  Procedure: ESOPHAGOGASTRODUODENOSCOPY (EGD) WITH               PROPOFOL ;  Surgeon: Janalyn Keene NOVAK, MD;  Location:               [facility] ENDOSCOPY;  Service: Endoscopy;  Laterality: N/A; No date: FRACTURE SURGERY Childhood: TONSILLECTOMY; Bilateral No date: TUBAL LIGATION No date: WRIST FRACTURE SURGERY; Right     Comment:  in childhood  BMI    Body Mass Index:  36.32 kg/m      Reproductive/Obstetrics negative OB ROS                              Anesthesia Physical Anesthesia Plan  ASA: 3  Anesthesia Plan: General   Post-op Pain Management:    Induction: Intravenous  PONV Risk Score and Plan: Propofol  infusion and TIVA  Airway Management Planned: Natural Airway and Nasal Cannula  Additional Equipment:   Intra-op Plan:   Post-operative Plan:   Informed Consent: I have reviewed the patients History and Physical, chart, labs and discussed the procedure including the risks, benefits and alternatives for the proposed anesthesia with the patient or authorized representative who has indicated his/her understanding and acceptance.     Dental Advisory Given  Plan Discussed with: CRNA  Anesthesia Plan Comments:         Anesthesia Quick Evaluation

## 2024-07-10 NOTE — Op Note (Addendum)
 [facility] Patient Name: Deborah Jenkins Procedure Date: 07/10/2024 8:10 AM MRN: [id number] Account #: [id number] Date of Birth: 01/23/1956 Admit Type: Outpatient Age: 68 Room: [facility] [address] ENDO ROOM 3 Gender: Female Note Status: Supervisor Override Instrument Name: Colon Scope [id number] Procedure:             Colonoscopy Indications:           Colon cancer screening in patient at increased risk:                         Family history of 1st-degree relative with colon                         polyps, Last colonoscopy: April 2022, Rectal bleeding Providers:             Corinn Jess Brooklyn MD, MD Referring MD:          Jon HERO. Bacigalupo (Referring MD) Medicines:             General Anesthesia Complications:         No immediate complications. Estimated blood loss: None. Procedure:             Pre-Anesthesia Assessment:                        - Prior to the procedure, a History and Physical was                         performed, and patient medications and allergies were                         reviewed. The patient is competent. The risks and                         benefits of the procedure and the sedation options and                         risks were discussed with the patient. All questions                         were answered and informed consent was obtained.                         Patient identification and proposed procedure were                         verified by the physician, the nurse, the                         anesthesiologist, the anesthetist and the technician                         in the pre-procedure area in the procedure room in the                         endoscopy suite. Mental Status Examination: alert and                         oriented. Airway Examination: normal oropharyngeal  airway and neck mobility. Respiratory Examination:                         clear to auscultation. CV Examination: normal.                          Prophylactic Antibiotics: The patient does not require                         prophylactic antibiotics. Prior Anticoagulants: The                         patient has taken no anticoagulant or antiplatelet                         agents. ASA Grade Assessment: III - A patient with                         severe systemic disease. After reviewing the risks and                         benefits, the patient was deemed in satisfactory                         condition to undergo the procedure. The anesthesia                         plan was to use general anesthesia. Immediately prior                         to administration of medications, the patient was                         re-assessed for adequacy to receive sedatives. The                         heart rate, respiratory rate, oxygen saturations,                         blood pressure, adequacy of pulmonary ventilation, and                         response to care were monitored throughout the                         procedure. The physical status of the patient was                         re-assessed after the procedure.                        After obtaining informed consent, the colonoscope was                         passed under direct vision. Throughout the procedure,                         the patient's blood pressure, pulse, and oxygen  saturations were monitored continuously. The                         Colonoscope was introduced through the anus and                         advanced to the the cecum, identified by appendiceal                         orifice and ileocecal valve. The colonoscopy was                         performed without difficulty. The patient tolerated                         the procedure well. The quality of the bowel                         preparation was good. The ileocecal valve, appendiceal                         orifice, and rectum were photographed. Findings:       The perianal and digital rectal examinations were normal. Pertinent       negatives include normal sphincter tone and no palpable rectal lesions.      Multiple diverticula were found in the left colon.      The retroflexed view of the distal rectum and anal verge was normal and       showed no anal or rectal abnormalities.      The exam was otherwise without abnormality. Impression:            - Diverticulosis in the left colon.                        - The distal rectum and anal verge are normal on                         retroflexion view.                        - The examination was otherwise normal.                        - No specimens collected. Recommendation:        - Discharge patient to home (with escort).                        - Resume previous diet today.                        - Continue present medications.                        - Repeat colonoscopy in 10 years for screening                         purposes. Procedure Code(s):     --- Professional ---                        [phone], Colonoscopy,  flexible; diagnostic, including                         collection of specimen(s) by brushing or washing, when                         performed (separate procedure) Diagnosis Code(s):     --- Professional ---                        K62.5, Hemorrhage of anus and rectum                        K57.30, Diverticulosis of large intestine without                         perforation or abscess without bleeding CPT copyright 2022 [employer]. All rights reserved. The codes documented in this report are preliminary and upon coder review may  be revised to meet current compliance requirements. Dr. Corinn Brooklyn Corinn Jess Brooklyn MD, MD 07/10/2024 9:09:23 AM This report has been signed electronically. Number of Addenda: 0 Note Initiated On: 07/10/2024 8:10 AM Scope Withdrawal Time: 0 hours 4 minutes 43 seconds  Total Procedure Duration: 0 hours 7 minutes 59 seconds   Estimated Blood Loss:  Estimated blood loss: none.      [facility]

## 2024-07-11 NOTE — Addendum Note (Signed)
 Addendum  created 07/11/24 0958 by Stacie Channel, CRNA   Intraprocedure Staff edited

## 2024-09-14 ENCOUNTER — Ambulatory Visit
Admission: EM | Admit: 2024-09-14 | Discharge: 2024-09-14 | Disposition: A | Discharge status: Home / Self Care | Attending: Emergency Medicine | Admitting: Emergency Medicine

## 2024-09-14 ENCOUNTER — Encounter: Payer: Self-pay | Admitting: Oncology

## 2024-09-14 ENCOUNTER — Other Ambulatory Visit: Payer: Self-pay

## 2024-09-14 DIAGNOSIS — J209 Acute bronchitis, unspecified: Secondary | ICD-10-CM

## 2024-09-14 DIAGNOSIS — J0100 Acute maxillary sinusitis, unspecified: Secondary | ICD-10-CM

## 2024-09-14 MED ORDER — ALBUTEROL SULFATE HFA 108 (90 BASE) MCG/ACT IN AERS
1.0000 | INHALATION_SPRAY | Freq: Four times a day (QID) | RESPIRATORY_TRACT | 0 refills | Status: AC | PRN
  Filled 2024-09-14: qty 18, 30d supply, fill #0

## 2024-09-14 MED ORDER — AMOXICILLIN-POT CLAVULANATE 875-125 MG PO TABS
1.0000 | ORAL_TABLET | Freq: Two times a day (BID) | ORAL | 0 refills | Status: AC
  Filled 2024-09-14: qty 14, 7d supply, fill #0

## 2024-09-14 MED ORDER — BENZONATATE 100 MG PO CAPS
100.0000 mg | ORAL_CAPSULE | Freq: Three times a day (TID) | ORAL | 0 refills | Status: AC | PRN
  Filled 2024-09-14: qty 21, 7d supply, fill #0

## 2024-09-14 MED ORDER — PREDNISONE 10 MG PO TABS
ORAL_TABLET | Freq: Every day | ORAL | 0 refills | Status: AC
  Filled 2024-09-14: qty 21, 6d supply, fill #0

## 2024-09-14 NOTE — ED Provider Notes (Signed)
 " Deborah Jenkins    CSN: [id number] Arrival date & time: 09/14/24  1253      History   Chief Complaint Chief Complaint  Patient presents with   Nasal Congestion   Cough    HPI Deborah Jenkins is a 69 y.o. female.  Patient presents with 7-day history of congestion, cough, wheezing, headache.  Treatment attempted with OTC cold and cough medications.  No fever, chest pain, or shortness of breath.  Patient was seen here on 05/21/2024; diagnosed with acute sinusitis and bronchitis; treated with Augmentin , prednisone , albuterol  inhaler.  The history is provided by the patient and medical records.    Past Medical History:  Diagnosis Date   Acute lower GI bleeding 11/13/2020   Depression 2006   GERD (gastroesophageal reflux disease) 2006   Iron  deficiency anemia 01/04/2021    Patient Active Problem List   Diagnosis Date Noted   Moderate episode of recurrent major depressive disorder (HCC) 10/05/2023   Bilateral hearing loss 04/03/2023   Prediabetes 08/02/2021   Iron  deficiency anemia 01/04/2021   Other dysphagia    Gastric nodule    Atrophic gastritis without hemorrhage    Polyp of colon    Diverticulosis of colon with hemorrhage    Diverticulosis 11/14/2020   Obesity, morbid (HCC) 03/22/2019   Psoriasis 03/22/2019   Anxiety 04/06/2018   Hyperlipidemia 04/06/2018   Tobacco use disorder 01/10/2018   GERD (gastroesophageal reflux disease) 01/10/2018   Allergic rhinitis 08/21/2015    Past Surgical History:  Procedure Laterality Date   COLONOSCOPY N/A 07/10/2024   Procedure: COLONOSCOPY;  Surgeon: Unk Corinn Skiff, MD;  Location: [facility] ENDOSCOPY;  Service: Gastroenterology;  Laterality: N/A;   COLONOSCOPY WITH PROPOFOL  N/A 12/09/2020   Procedure: COLONOSCOPY WITH PROPOFOL ;  Surgeon: Janalyn Keene NOVAK, MD;  Location: [facility] ENDOSCOPY;  Service: Endoscopy;  Laterality: N/A;   ESOPHAGOGASTRODUODENOSCOPY (EGD) WITH PROPOFOL  N/A 12/09/2020   Procedure:  ESOPHAGOGASTRODUODENOSCOPY (EGD) WITH PROPOFOL ;  Surgeon: Janalyn Keene NOVAK, MD;  Location: [facility] ENDOSCOPY;  Service: Endoscopy;  Laterality: N/A;   FRACTURE SURGERY     TONSILLECTOMY Bilateral Childhood   TUBAL LIGATION     WRIST FRACTURE SURGERY Right    in childhood    OB History   No obstetric history on file.      Home Medications    Prior to Admission medications  Medication Sig Start Date End Date Taking? Authorizing Provider  albuterol  (VENTOLIN  HFA) 108 (90 Base) MCG/ACT inhaler Inhale 1-2 puffs into the lungs every 6 (six) hours as needed. 09/14/24  Yes Corlis Burnard DEL, NP  amoxicillin -clavulanate (AUGMENTIN ) 875-125 MG tablet Take 1 tablet by mouth every 12 (twelve) hours. 09/14/24  Yes Corlis Burnard DEL, NP  b complex vitamins capsule Take 1 capsule by mouth daily.   Yes [provider]  benzonatate  (TESSALON ) 100 MG capsule Take 1 capsule (100 mg total) by mouth 3 (three) times daily as needed for cough. 09/14/24  Yes Corlis Burnard DEL, NP  cholecalciferol  (VITAMIN D3) 25 MCG (1000 UNIT) tablet Take 1,000 Units by mouth daily.   Yes [provider]  Ferrous Sulfate (IRON  PO) Take 25 mg by mouth daily.   Yes [provider]  FLUoxetine  (PROZAC ) 20 MG capsule Take 2 capsules (40 mg total) by mouth daily. 07/04/24  Yes Bacigalupo, Jon HERO, MD  omeprazole  (PRILOSEC) 20 MG capsule Take 1 capsule (20 mg total) by mouth daily. 04/03/24 04/03/25 Yes Bacigalupo, Angela M, MD  predniSONE  (STERAPRED UNI-PAK 21 TAB) 10 MG (21)  TBPK tablet Take by mouth daily. As directed 09/14/24  Yes Corlis Burnard DEL, NP  rosuvastatin  (CRESTOR ) 10 MG tablet Take 1 tablet (10 mg total) by mouth daily. 04/03/24  Yes Bacigalupo, Angela M, MD  fluticasone  (FLONASE ) 50 MCG/ACT nasal spray Place 1 spray into both nostrils daily as needed for allergies or rhinitis. Patient not taking: Reported on 09/14/2024 03/29/21   Bacigalupo, Angela M, MD  hydrocortisone cream 0.5 % Apply 1 application topically  daily as needed for itching (on elbows).    [provider]  Melatonin 10 MG TABS Take 10 mg by mouth at bedtime.    [provider]    Family History Family History  Problem Relation Age of Onset   Hypertension Mother    Diverticulosis Mother    Healthy Father    Hypertension Sister    Healthy Brother    Healthy Sister    Alcohol abuse Brother    Alcohol abuse Brother    Cirrhosis Brother    Cirrhosis Brother    Colon cancer Neg Hx    Breast cancer Neg Hx    Ovarian cancer Neg Hx    Cervical cancer Neg Hx     Social History Social History[1]   Allergies   Atorvastatin    Review of Systems Review of Systems  Constitutional:  Negative for chills and fever.  HENT:  Positive for congestion. Negative for ear pain and sore throat.   Respiratory:  Positive for cough and wheezing. Negative for shortness of breath.   Neurological:  Positive for headaches.     Physical Exam Triage Vital Signs ED Triage Vitals  Encounter Vitals Group     BP 09/14/24 1322 121/84     Girls Systolic BP Percentile --      Girls Diastolic BP Percentile --      Boys Systolic BP Percentile --      Boys Diastolic BP Percentile --      Pulse Rate 09/14/24 1322 74     Resp 09/14/24 1322 20     Temp 09/14/24 1322 98.1 F (36.7 C)     Temp src --      SpO2 09/14/24 1322 95 %     Weight --      Height --      Head Circumference --      Peak Flow --      Pain Score 09/14/24 1320 0     Pain Loc --      Pain Education --      Exclude from Growth Chart --    No data found.  Updated Vital Signs BP 121/84   Pulse 74   Temp 98.1 F (36.7 C)   Resp 20   SpO2 95%   Visual Acuity Right Eye Distance:   Left Eye Distance:   Bilateral Distance:    Right Eye Near:   Left Eye Near:    Bilateral Near:     Physical Exam Constitutional:      General: She is not in acute distress. HENT:     Right Ear: Tympanic membrane normal.     Left Ear: Tympanic membrane normal.      Nose: Congestion present.     Mouth/Throat:     Mouth: Mucous membranes are moist.     Pharynx: Oropharynx is clear.  Cardiovascular:     Rate and Rhythm: Normal rate and regular rhythm.     Heart sounds: Normal heart sounds.  Pulmonary:  Effort: Pulmonary effort is normal. No respiratory distress.     Breath sounds: Wheezing present.     Comments: Bilateral expiratory wheezes.  No respiratory distress.  O2 sat 95% on room air. Neurological:     Mental Status: She is alert.      UC Treatments / Results  Labs (all labs ordered are listed, but only abnormal results are displayed) Labs Reviewed - No data to display  EKG   Radiology No results found.  Procedures Procedures (including critical care time)  Medications Ordered in UC Medications - No data to display  Initial Impression / Assessment and Plan / UC Course  I have reviewed the triage vital signs and the nursing notes.  Pertinent labs & imaging results that were available during my care of the patient were reviewed by me and considered in my medical decision making (see chart for details).    Acute sinusitis and bronchitis.  Afebrile and vital signs are stable.  O2 sat 95% on room air.  Wheezing noted bilaterally.  No respiratory distress.  Treating today with albuterol  inhaler, prednisone , Augmentin , Tessalon  Perles.  Instructed patient to follow-up with her PCP on Monday.  ED precautions given.  She agrees to plan of care.  Final Clinical Impressions(s) / UC Diagnoses   Final diagnoses:  Acute non-recurrent maxillary sinusitis  Acute bronchitis, unspecified organism     Discharge Instructions      Use the albuterol  inhaler as directed.  Take the Augmentin  and prednisone  as directed.  Follow-up with your primary care provider on Monday.  Go to the emergency department if you have worsening symptoms.     ED Prescriptions     Medication Sig Dispense Auth. Provider   albuterol  (VENTOLIN  HFA) 108 (90  Base) MCG/ACT inhaler Inhale 1-2 puffs into the lungs every 6 (six) hours as needed. 18 g Corlis Burnard DEL, NP   amoxicillin -clavulanate (AUGMENTIN ) 875-125 MG tablet Take 1 tablet by mouth every 12 (twelve) hours. 14 tablet Corlis Burnard H, NP   predniSONE  (STERAPRED UNI-PAK 21 TAB) 10 MG (21) TBPK tablet Take by mouth daily. As directed 21 tablet Corlis Burnard DEL, NP   benzonatate  (TESSALON ) 100 MG capsule Take 1 capsule (100 mg total) by mouth 3 (three) times daily as needed for cough. 21 capsule Corlis Burnard DEL, NP      PDMP not reviewed this encounter.    [1]  Social History Tobacco Use   Smoking status: Every Day    Current packs/day: 0.30    Average packs/day: 0.3 packs/day for 20.0 years (6.0 ttl pk-yrs)    Types: Cigarettes   Smokeless tobacco: Never   Tobacco comments:    started smoking at 70 YO; is smoking 1/3 PPD  Vaping Use   Vaping status: Never Used  Substance Use Topics   Alcohol use: No    Alcohol/week: 0.0 standard drinks of alcohol   Drug use: No     Corlis Burnard DEL, NP 09/14/24 1353  "

## 2024-09-14 NOTE — Discharge Instructions (Addendum)
 Use the albuterol  inhaler as directed.  Take the Augmentin  and prednisone  as directed.  Follow-up with your primary care provider on Monday.  Go to the emergency department if you have worsening symptoms.

## 2024-09-14 NOTE — ED Triage Notes (Signed)
 Patient to Urgent Care with complaints of nasal congestion/ productive cough/ wheezing/ headaches. Unsure of any fevers.   Symptoms x7 days.  Taking alka seltzer cough and chest congestion/ alka seltzer pearl/

## 2024-10-03 ENCOUNTER — Ambulatory Visit: Admitting: Family Medicine

## 2025-04-03 ENCOUNTER — Ambulatory Visit: Admitting: Family Medicine
